# Patient Record
Sex: Male | Born: 1970 | Race: Black or African American | Hispanic: No | Marital: Married | Smoking: Never smoker
Health system: Southern US, Community
[De-identification: ages and names within clinical notes are randomized; demographics above are authoritative.]

## PROBLEM LIST (undated history)

## (undated) DIAGNOSIS — L80 Vitiligo: Secondary | ICD-10-CM

## (undated) DIAGNOSIS — I10 Essential (primary) hypertension: Secondary | ICD-10-CM

## (undated) DIAGNOSIS — K922 Gastrointestinal hemorrhage, unspecified: Secondary | ICD-10-CM

## (undated) DIAGNOSIS — T7840XA Allergy, unspecified, initial encounter: Secondary | ICD-10-CM

## (undated) DIAGNOSIS — M109 Gout, unspecified: Secondary | ICD-10-CM

## (undated) DIAGNOSIS — I219 Acute myocardial infarction, unspecified: Secondary | ICD-10-CM

## (undated) DIAGNOSIS — K5790 Diverticulosis of intestine, part unspecified, without perforation or abscess without bleeding: Secondary | ICD-10-CM

## (undated) HISTORY — DX: Allergy, unspecified, initial encounter: T78.40XA

## (undated) HISTORY — DX: Acute myocardial infarction, unspecified: I21.9

## (undated) HISTORY — DX: Gastrointestinal hemorrhage, unspecified: K92.2

## (undated) HISTORY — PX: HERNIA REPAIR: SHX51

---

## 2016-04-20 ENCOUNTER — Encounter (HOSPITAL_COMMUNITY): Payer: Self-pay

## 2016-04-20 DIAGNOSIS — K5730 Diverticulosis of large intestine without perforation or abscess without bleeding: Secondary | ICD-10-CM | POA: Diagnosis not present

## 2016-04-20 DIAGNOSIS — K640 First degree hemorrhoids: Secondary | ICD-10-CM | POA: Diagnosis not present

## 2016-04-20 DIAGNOSIS — N179 Acute kidney failure, unspecified: Secondary | ICD-10-CM | POA: Insufficient documentation

## 2016-04-20 DIAGNOSIS — L80 Vitiligo: Secondary | ICD-10-CM | POA: Diagnosis not present

## 2016-04-20 DIAGNOSIS — K921 Melena: Principal | ICD-10-CM | POA: Insufficient documentation

## 2016-04-20 DIAGNOSIS — D62 Acute posthemorrhagic anemia: Secondary | ICD-10-CM | POA: Diagnosis not present

## 2016-04-20 DIAGNOSIS — Z9114 Patient's other noncompliance with medication regimen: Secondary | ICD-10-CM | POA: Diagnosis not present

## 2016-04-20 DIAGNOSIS — I10 Essential (primary) hypertension: Secondary | ICD-10-CM | POA: Insufficient documentation

## 2016-04-20 LAB — CBC
HEMATOCRIT: 39.5 % (ref 39.0–52.0)
HEMOGLOBIN: 12.8 g/dL — AB (ref 13.0–17.0)
MCH: 28.4 pg (ref 26.0–34.0)
MCHC: 32.4 g/dL (ref 30.0–36.0)
MCV: 87.6 fL (ref 78.0–100.0)
Platelets: 201 10*3/uL (ref 150–400)
RBC: 4.51 MIL/uL (ref 4.22–5.81)
RDW: 12.2 % (ref 11.5–15.5)
WBC: 5.8 10*3/uL (ref 4.0–10.5)

## 2016-04-20 LAB — COMPREHENSIVE METABOLIC PANEL
ALT: 26 U/L (ref 17–63)
ANION GAP: 8 (ref 5–15)
AST: 38 U/L (ref 15–41)
Albumin: 3.7 g/dL (ref 3.5–5.0)
Alkaline Phosphatase: 70 U/L (ref 38–126)
BILIRUBIN TOTAL: 0.8 mg/dL (ref 0.3–1.2)
BUN: 18 mg/dL (ref 6–20)
CHLORIDE: 105 mmol/L (ref 101–111)
CO2: 24 mmol/L (ref 22–32)
Calcium: 8.8 mg/dL — ABNORMAL LOW (ref 8.9–10.3)
Creatinine, Ser: 2.03 mg/dL — ABNORMAL HIGH (ref 0.61–1.24)
GFR, EST AFRICAN AMERICAN: 43 mL/min — AB (ref >60)
GFR, EST NON AFRICAN AMERICAN: 37 mL/min — AB (ref >60)
Glucose, Bld: 118 mg/dL — ABNORMAL HIGH (ref 65–99)
POTASSIUM: 3.8 mmol/L (ref 3.5–5.1)
Sodium: 137 mmol/L (ref 135–145)
TOTAL PROTEIN: 6.9 g/dL (ref 6.5–8.1)

## 2016-04-20 NOTE — ED Triage Notes (Addendum)
Pt endorses 6 episodes of bloody diarrhea this evening. Denies n/v or abd pain. Pt hypertensive in triage and states that he hasn't taken his BP meds in 2 days. Pt was seen at [facility] and sent here for further evaluation.

## 2016-04-21 ENCOUNTER — Encounter (HOSPITAL_COMMUNITY): Payer: Self-pay | Admitting: Internal Medicine

## 2016-04-21 ENCOUNTER — Observation Stay (HOSPITAL_COMMUNITY)
Admission: EM | Admit: 2016-04-21 | Discharge: 2016-04-22 | Disposition: A | Discharge status: Home / Self Care | Payer: Managed Care, Other (non HMO) | Attending: Internal Medicine | Admitting: Internal Medicine

## 2016-04-21 ENCOUNTER — Inpatient Hospital Stay (HOSPITAL_COMMUNITY): Payer: Managed Care, Other (non HMO)

## 2016-04-21 DIAGNOSIS — D62 Acute posthemorrhagic anemia: Secondary | ICD-10-CM | POA: Diagnosis not present

## 2016-04-21 DIAGNOSIS — K5731 Diverticulosis of large intestine without perforation or abscess with bleeding: Secondary | ICD-10-CM | POA: Diagnosis not present

## 2016-04-21 DIAGNOSIS — K921 Melena: Secondary | ICD-10-CM | POA: Diagnosis not present

## 2016-04-21 DIAGNOSIS — K922 Gastrointestinal hemorrhage, unspecified: Secondary | ICD-10-CM

## 2016-04-21 DIAGNOSIS — Z9114 Patient's other noncompliance with medication regimen: Secondary | ICD-10-CM

## 2016-04-21 DIAGNOSIS — L80 Vitiligo: Secondary | ICD-10-CM | POA: Diagnosis not present

## 2016-04-21 DIAGNOSIS — I10 Essential (primary) hypertension: Secondary | ICD-10-CM

## 2016-04-21 DIAGNOSIS — K625 Hemorrhage of anus and rectum: Secondary | ICD-10-CM | POA: Diagnosis present

## 2016-04-21 HISTORY — DX: Vitiligo: L80

## 2016-04-21 HISTORY — DX: Melena: K92.1

## 2016-04-21 HISTORY — DX: Essential (primary) hypertension: I10

## 2016-04-21 LAB — GASTROINTESTINAL PANEL BY PCR, STOOL (REPLACES STOOL CULTURE)
ASTROVIRUS: NOT DETECTED
Adenovirus F40/41: NOT DETECTED
Campylobacter species: NOT DETECTED
Cryptosporidium: NOT DETECTED
Cyclospora cayetanensis: NOT DETECTED
ENTEROTOXIGENIC E COLI (ETEC): NOT DETECTED
Entamoeba histolytica: NOT DETECTED
Enteroaggregative E coli (EAEC): NOT DETECTED
Enteropathogenic E coli (EPEC): NOT DETECTED
Giardia lamblia: NOT DETECTED
NOROVIRUS GI/GII: NOT DETECTED
Plesimonas shigelloides: NOT DETECTED
ROTAVIRUS A: NOT DETECTED
SALMONELLA SPECIES: NOT DETECTED
SAPOVIRUS (I, II, IV, AND V): NOT DETECTED
SHIGA LIKE TOXIN PRODUCING E COLI (STEC): NOT DETECTED
SHIGELLA/ENTEROINVASIVE E COLI (EIEC): NOT DETECTED
VIBRIO SPECIES: NOT DETECTED
Vibrio cholerae: NOT DETECTED
Yersinia enterocolitica: NOT DETECTED

## 2016-04-21 LAB — PROTIME-INR
INR: 1.05
PROTHROMBIN TIME: 13.7 s (ref 11.4–15.2)

## 2016-04-21 LAB — CBC
HCT: 37.4 % — ABNORMAL LOW (ref 39.0–52.0)
HCT: 37.2 % — ABNORMAL LOW (ref 39.0–52.0)
HEMATOCRIT: 39.4 % (ref 39.0–52.0)
HEMOGLOBIN: 11.9 g/dL — AB (ref 13.0–17.0)
Hemoglobin: 12.2 g/dL — ABNORMAL LOW (ref 13.0–17.0)
Hemoglobin: 12.5 g/dL — ABNORMAL LOW (ref 13.0–17.0)
MCH: 28.4 pg (ref 26.0–34.0)
MCH: 28 pg (ref 26.0–34.0)
MCH: 27.9 pg (ref 26.0–34.0)
MCHC: 32.6 g/dL (ref 30.0–36.0)
MCHC: 32 g/dL (ref 30.0–36.0)
MCHC: 31.7 g/dL (ref 30.0–36.0)
MCV: 87 fL (ref 78.0–100.0)
MCV: 87.9 fL (ref 78.0–100.0)
MCV: 87.5 fL (ref 78.0–100.0)
PLATELETS: 204 10*3/uL (ref 150–400)
Platelets: 191 10*3/uL (ref 150–400)
Platelets: 204 10*3/uL (ref 150–400)
RBC: 4.25 MIL/uL (ref 4.22–5.81)
RBC: 4.3 MIL/uL (ref 4.22–5.81)
RBC: 4.48 MIL/uL (ref 4.22–5.81)
RDW: 12.2 % (ref 11.5–15.5)
RDW: 12.3 % (ref 11.5–15.5)
RDW: 12.3 % (ref 11.5–15.5)
WBC: 4.7 10*3/uL (ref 4.0–10.5)
WBC: 4.9 10*3/uL (ref 4.0–10.5)
WBC: 5.5 10*3/uL (ref 4.0–10.5)

## 2016-04-21 LAB — BASIC METABOLIC PANEL
Anion gap: 7 (ref 5–15)
BUN: 20 mg/dL (ref 6–20)
CHLORIDE: 107 mmol/L (ref 101–111)
CO2: 24 mmol/L (ref 22–32)
CREATININE: 1.91 mg/dL — AB (ref 0.61–1.24)
Calcium: 8.6 mg/dL — ABNORMAL LOW (ref 8.9–10.3)
GFR calc Af Amer: 47 mL/min — ABNORMAL LOW (ref >60)
GFR calc non Af Amer: 40 mL/min — ABNORMAL LOW (ref >60)
GLUCOSE: 95 mg/dL (ref 65–99)
Potassium: 4.1 mmol/L (ref 3.5–5.1)
Sodium: 138 mmol/L (ref 135–145)

## 2016-04-21 LAB — HIV ANTIBODY (ROUTINE TESTING W REFLEX): HIV SCREEN 4TH GENERATION: NONREACTIVE

## 2016-04-21 MED ORDER — AMLODIPINE BESYLATE 10 MG PO TABS
10.0000 mg | ORAL_TABLET | Freq: Every day | ORAL | Status: DC
  Administered 2016-04-21 – 2016-04-22 (×2): 10 mg via ORAL
  Filled 2016-04-21 (×2): qty 1

## 2016-04-21 MED ORDER — ONDANSETRON HCL 4 MG PO TABS: 4.0000 mg | ORAL_TABLET | Freq: Four times a day (QID) | ORAL | Status: DC | PRN

## 2016-04-21 MED ORDER — ONDANSETRON HCL 4 MG/2ML IJ SOLN: 4.0000 mg | Freq: Four times a day (QID) | INTRAMUSCULAR | Status: DC | PRN

## 2016-04-21 MED ORDER — PEG-KCL-NACL-NASULF-NA ASC-C 100 G PO SOLR
1.0000 | Freq: Once | ORAL | Status: AC
  Administered 2016-04-21: 200 g via ORAL
  Filled 2016-04-21: qty 1

## 2016-04-21 MED ORDER — SODIUM CHLORIDE 0.9% FLUSH
3.0000 mL | Freq: Two times a day (BID) | INTRAVENOUS | Status: DC
  Administered 2016-04-21: 3 mL via INTRAVENOUS

## 2016-04-21 MED ORDER — IOHEXOL 300 MG/ML  SOLN
15.0000 mL | INTRAMUSCULAR | Status: AC
  Administered 2016-04-21 (×2): 15 mL via ORAL

## 2016-04-21 MED ORDER — IOPAMIDOL (ISOVUE-300) INJECTION 61%
INTRAVENOUS | Status: AC
  Administered 2016-04-21: 75 mL
  Filled 2016-04-21: qty 75

## 2016-04-21 MED ORDER — SODIUM CHLORIDE 0.9 % IV SOLN: INTRAVENOUS | Status: AC

## 2016-04-21 MED ORDER — AMLODIPINE BESYLATE 10 MG PO TABS: 10.0000 mg | ORAL_TABLET | Freq: Every day | ORAL | Status: DC

## 2016-04-21 MED ORDER — HYDROCODONE-ACETAMINOPHEN 5-325 MG PO TABS: 1.0000 | ORAL_TABLET | ORAL | Status: DC | PRN

## 2016-04-21 MED ORDER — HYDRALAZINE HCL 20 MG/ML IJ SOLN: 10.0000 mg | INTRAMUSCULAR | Status: DC | PRN

## 2016-04-21 MED ORDER — SODIUM CHLORIDE 0.9 % IV SOLN
INTRAVENOUS | Status: DC
  Administered 2016-04-21: 05:00:00 via INTRAVENOUS

## 2016-04-21 MED ORDER — LISINOPRIL 10 MG PO TABS
10.0000 mg | ORAL_TABLET | Freq: Once | ORAL | Status: AC
  Administered 2016-04-21: 10 mg via ORAL
  Filled 2016-04-21: qty 1

## 2016-04-21 NOTE — H&P (Addendum)
History and Physical    Chase Walsh RUE:[id number] DOB: June 14, 1970 DOA: 04/21/2016  PCP: Gwynneth Aliment, MD  Patient coming from: Home  Chief Complaint: rectal bleeding  HPI: Chase Walsh is a 46 y.o. male with medical history significant of HTN and vitiligo who presents for BRBPR.  He reports that he was in his normal state of health today at work when he felt the need to have a BM.  He went to the bathroom and felt like the BM was watery, which surprised him.  He wiped and noticed blood, and then saw the the toilet bowel was filled with blood.  He had about 2-3 more episodes like that and then came in to [facility] where he was advised to go to the ED.  He had three episodes in the ED, one very large.  He has not had any new foods (ate tuna sandwhich from [place] for lunch today), no travel, no sick contact.  He has had no fever, chills, abdominal pain, bleeding from any other sites, rash.  He has been taking ibuprofen and amoxicillin for a recent dental infection.  He denies any lightheadedness or dizziness.  He has no family history of IBD.  He thinks his dad's brother may have colon cancer for which he is receiving treatment currently, but he is not sure.  He is a non smoker.  He has no upper GI symptoms.    ED Course: In the ED, he had a witnessed episode of hematochezia.  BP was elevated but he did not take his BP medications today. Initial Hgb was 12.8 and trended to 12.2 on recheck.  Cr was 2.03 - he has no reported history of CKD, however, BUN was normal.    Review of Systems: As per HPI otherwise 10 point review of systems negative.   Past Medical History:  Diagnosis Date  . Hypertension   . Vitiligo     Past Surgical History:  Procedure Laterality Date  . HERNIA REPAIR     Reviewed with patient.   reports that he has never smoked. He has never used smokeless tobacco. He reports that he does not drink alcohol or use drugs.  No Known Allergies  Reviewed with patient.  Family  History  Problem Relation Age of Onset  . Breast cancer Mother   . Arthritis Mother   . Cancer Paternal Uncle     maybe colon  . Inflammatory bowel disease Neg Hx     Also on lisinopril, has not taken in 2 days.  Prior to Admission medications   Medication Sig Start Date End Date Taking? Authorizing Provider  amoxicillin (AMOXIL) 500 MG capsule Take 500 mg by mouth daily. 04/05/16  Yes Historical Provider, MD  ibuprofen (ADVIL,MOTRIN) 800 MG tablet Take 800 mg by mouth 3 (three) times daily as needed for mild pain.  04/05/16  Yes Historical Provider, MD    Physical Exam: Vitals:   04/21/16 0100 04/21/16 0130 04/21/16 0359 04/21/16 0435  BP: (!) 154/112 (!) 151/115 (!) 147/110 (!) 156/107  Pulse:  65 64 73  Resp:    18  Temp:    97.8 F (36.6 C)  TempSrc:    Oral  SpO2:  99% 98% 100%  Weight:    205 lb 9.6 oz (93.3 kg)  Height:     (1.753 m)    Constitutional: NAD, calm, comfortable, sitting in bed Vitals:   04/21/16 0100 04/21/16 0130 04/21/16 0359 04/21/16 0435  BP: (!) 154/112 (!) 151/115 Marland Kitchen)  147/110 (!) 156/107  Pulse:  65 64 73  Resp:    18  Temp:    97.8 F (36.6 C)  TempSrc:    Oral  SpO2:  99% 98% 100%  Weight:    205 lb 9.6 oz (93.3 kg)  Height:     (1.753 m)   Eyes:  lids and conjunctivae normal, no pallor, no icterus ENMT: Mucous membranes are moist.  Neck: normal, supple Respiratory: clear to auscultation bilaterally, no wheezing, no crackles. Normal respiratory effort.  Cardiovascular: Normal rate and Regular rhythm, no murmurs / rubs / gallops. No extremity edema.  Abdomen: +BS, NT, ND Musculoskeletal: no clubbing / cyanosis. Normal muscle tone.  Skin: no rashes, lesions, ulcers.  Lighter patches of skin on chest and behind ears.  Neurologic: Grossly intact, no focal deficit Psychiatric: Normal judgment and insight. Alert and oriented x 3. Normal mood.    Labs on Admission: I have personally reviewed following labs and imaging  studies  CBC:  Recent Labs Lab 04/20/16 2052 04/21/16 0028  WBC 5.8 5.5  HGB 12.8* 12.2*  HCT 39.5 37.4*  MCV 87.6 87.0  PLT 201 204   Basic Metabolic Panel:  Recent Labs Lab 04/20/16 2052  NA 137  K 3.8  CL 105  CO2 24  GLUCOSE 118*  BUN 18  CREATININE 2.03*  CALCIUM 8.8*   GFR: Estimated Creatinine Clearance: 50.7 mL/min (A) (by C-G formula based on SCr of 2.03 mg/dL (H)). Liver Function Tests:  Recent Labs Lab 04/20/16 2052  AST 38  ALT 26  ALKPHOS 70  BILITOT 0.8  PROT 6.9  ALBUMIN 3.7   No results for input(s): LIPASE, AMYLASE in the last 168 hours. No results for input(s): AMMONIA in the last 168 hours. Coagulation Profile:  Recent Labs Lab 04/21/16 0028  INR 1.05   Cardiac Enzymes: No results for input(s): CKTOTAL, CKMB, CKMBINDEX, TROPONINI in the last 168 hours. BNP (last 3 results) No results for input(s): PROBNP in the last 8760 hours. HbA1C: No results for input(s): HGBA1C in the last 72 hours. CBG: No results for input(s): GLUCAP in the last 168 hours. Lipid Profile: No results for input(s): CHOL, HDL, LDLCALC, TRIG, CHOLHDL, LDLDIRECT in the last 72 hours. Thyroid Function Tests: No results for input(s): TSH, T4TOTAL, FREET4, T3FREE, THYROIDAB in the last 72 hours. Anemia Panel: No results for input(s): VITAMINB12, FOLATE, FERRITIN, TIBC, IRON, RETICCTPCT in the last 72 hours. Urine analysis: No results found for: COLORURINE, APPEARANCEUR, LABSPEC, PHURINE, GLUCOSEU, HGBUR, BILIRUBINUR, KETONESUR, PROTEINUR, UROBILINOGEN, NITRITE, LEUKOCYTESUR  Radiological Exams on Admission: No results found.   Assessment/Plan  Hematochezia - DDx includes infectious etiology vs. Inflammatory process vs. Malignancy.  He has no upper GI symptoms so PPI was not started - Interestingly, he has vitiligo which can be associated with IBD. He has no FH of IBD and possibly a family history of colon cancer - LFTs added on, no AST/ALT/bilirubin  abnormalities noted.  - Initial Hgb 12.8 - Trend Hgb - GI pathogen panel sent - NPO - IVF with NS at 75cc/hr - Monitor on telemetry - Enteric precautions - FOBT not obtained as frank blood noted by EDP on exam.  - As he is currently hemodynamically stable, GI should be consulted in the AM for possible colonoscopy - He has been on Abx and ibuprofen for acute dental issues, these were held.  If not improving, consider Cdiff - though not normally associated with bloody diarrhea.     Vitiligo - Noted on  exam, no acute issue   Hypertension - BP has been elevated overnight, no medications for 2 days.  Lisinopril in ED provided some improvement in systolic pressure, but diastolic has remained somewhat elevated - Given renal function, change BP medication to amlodipine  CKD vs. AKI (unknown baseline) - IVF with NS - Trend Cr.    DVT prophylaxis: SCDs given ongoing bleeding Code Status: Full Disposition Plan: d/c in 1-2 days depending on course of illness Consults called: Needs GI in the AM Admission status: Telemetry, inpatient   Debe Coder MD [facility] Pager [phone]  If 7PM-7AM, please contact night-coverage [email or web address] Password [facility]  04/21/2016, 5:47 AM

## 2016-04-21 NOTE — ED Provider Notes (Signed)
[facility]-EMERGENCY DEPT Provider Note   CSN: [id number] Arrival date & time: 04/20/16  2026     History   Chief Complaint Chief Complaint  Patient presents with  . Blood In Stools    HPI Chase Walsh is a 46 y.o. male.  46 year old male with history of hypertension presents to the emergency department for evaluation of hematochezia. He states that symptoms began this evening. He has had approximately 6 episodes with passage of large amounts of blood. He denies any passage of clots. He has had no rectal pain or abdominal pain. No constipated BMs prior to onset of hematochezia. No nausea or vomiting or fevers. Patient was seen at urgent care with a normal rectal exam. They sent him to the emergency department for further evaluation. Patient has had 2 additional episodes of hematochezia since arrival to the ED. Patient reports eating a burger from cookout yesterday. He denies any other potential consumption of undercooked meats. He denies any recent travel. He has never had a colonoscopy. No known family history of colon cancer, per patient. He was noted to be hypertensive in triage, but has been noncompliant with his blood pressure medication of the last 2 days.     Past Medical History:  Diagnosis Date  . Hypertension     There are no active problems to display for this patient.   Past Surgical History:  Procedure Laterality Date  . HERNIA REPAIR         Home Medications    Prior to Admission medications   Medication Sig Start Date End Date Taking? Authorizing Provider  amoxicillin (AMOXIL) 500 MG capsule Take 500 mg by mouth daily. 04/05/16  Yes Historical Provider, MD  ibuprofen (ADVIL,MOTRIN) 800 MG tablet Take 800 mg by mouth 3 (three) times daily as needed for mild pain.  04/05/16  Yes Historical Provider, MD    Family History History reviewed. No pertinent family history.  Social History Social History  Substance Use Topics  . Smoking status: Never Smoker  .  Smokeless tobacco: Never Used  . Alcohol use No     Allergies   Patient has no known allergies.   Review of Systems Review of Systems Ten systems reviewed and are negative for acute change, except as noted in the HPI.    Physical Exam Updated Vital Signs BP (!) 151/115   Pulse 65   Temp 98.4 F (36.9 C) (Oral)   Resp 18   Ht  (1.753 m)   Wt 93.4 kg   SpO2 99%   BMI 30.42 kg/m   Physical Exam  Constitutional: He is oriented to person, place, and time. He appears well-developed and well-nourished. No distress.  Nontoxic and in no acute distress  HENT:  Head: Normocephalic and atraumatic.  Eyes: Conjunctivae and EOM are normal. No scleral icterus.  Neck: Normal range of motion.  Cardiovascular: Normal rate, regular rhythm and intact distal pulses.   Pulmonary/Chest: Effort normal. No respiratory distress. He has no wheezes.  Respirations even and unlabored  Abdominal: Soft. He exhibits no distension and no mass. There is no tenderness. There is no guarding.  Soft, nontender abdomen. No masses, guarding, or rigidity.  Genitourinary:  Genitourinary Comments: Deferred as patient received this at [facility] prior to arrival; reports no evidence of hemorrhoids or fissure.  Musculoskeletal: Normal range of motion.  Neurological: He is alert and oriented to person, place, and time. He exhibits normal muscle tone. Coordination normal.  Skin: Skin is warm and dry. No rash noted.  He is not diaphoretic. No erythema. No pallor.  Psychiatric: He has a normal mood and affect. His behavior is normal.  Nursing note and vitals reviewed.    ED Treatments / Results  Labs (all labs ordered are listed, but only abnormal results are displayed) Labs Reviewed  COMPREHENSIVE METABOLIC PANEL - Abnormal; Notable for the following:       Result Value   Glucose, Bld 118 (*)    Creatinine, Ser 2.03 (*)    Calcium 8.8 (*)    GFR calc non Af Amer 37 (*)    GFR calc Af Amer 43 (*)    All other  components within normal limits  CBC - Abnormal; Notable for the following:    Hemoglobin 12.8 (*)    All other components within normal limits  CBC - Abnormal; Notable for the following:    Hemoglobin 12.2 (*)    HCT 37.4 (*)    All other components within normal limits  GASTROINTESTINAL PANEL BY PCR, STOOL (REPLACES STOOL CULTURE)  PROTIME-INR    EKG  EKG Interpretation None       Radiology No results found.  Procedures Procedures (including critical care time)  Medications Ordered in ED Medications  lisinopril (PRINIVIL,ZESTRIL) tablet 10 mg (10 mg Oral Given 04/21/16 0100)     Initial Impression / Assessment and Plan / ED Course  I have reviewed the triage vital signs and the nursing notes.  Pertinent labs & imaging results that were available during my care of the patient were reviewed by me and considered in my medical decision making (see chart for details).     46 year old male presents to the emergency department for evaluation of gross hematochezia. Patient with images of large amounts of bloody stool. He provided a sample today which was mostly gross blood with clots. He has no complaints of abdominal pain. No nausea or vomiting. No fevers. Abdomen soft and nontender. Given degree of bleeding and persistence of symptoms, plan for admission for serial CBCs and inpatient colonoscopy. Patient currently hemodynamically stable. Case discussed with Dr. Criselda Peaches who will admit.   Final Clinical Impressions(s) / ED Diagnoses   Final diagnoses:  Lower GI bleed  Hypertension not at goal  Noncompliance with medication regimen    New Prescriptions New Prescriptions   No medications on file     Antony Madura, PA-C 04/21/16 0228    Jacalyn Lefevre, MD 04/21/16 [phone]

## 2016-04-21 NOTE — ED Notes (Signed)
Paged admitting MD Criselda Peaches to notify of blood pressure. Advises pt is appropriate for the floor at this time.

## 2016-04-21 NOTE — Consult Note (Signed)
 Referring Provider: [facility]  Primary Care Physician:  Robyn N Sanders, MD Primary Gastroenterologist:  unassigned  Reason for Consultation:  Rectal bleeding  ASSESSMENT AND PLAN:   1. 46 yo male with painless hematochezia and a one gram drop in hgb overnight from 12.8 to 11.9. He has been taking 800 mg Ibuprofen over the last week. Bleeding could be perianal in nature. Diverticular hemorrhage possible. A brisk upper Gi bleed seems unlikely -monitor CBC, transfuse if needed.  -Can have clears.  -Possible inpatient colonoscopy depending on clinical course. If not then close outpatient follow up and colonscopy  2. AKI. No prior labs to know if component of CKD. Cr improved overnight 2.03 >>> 1.91  3. HTN. Uncontrolled at prsent.   HPI: Chase Walsh is a 46 y.o. male, relatively healthy, admitted with painless hematochezia. No prior labs to compare but hgb on admission yesterday was 12.8, down to 11.9 overnight. His has AKI, not sure if CKD given no prior labs. His BUN is normal.   Yesterday after work patient began having hematochezia. First episode contained solid stool with blood then loose stool / blood going forward. This happened about 4 times and patient came to ED. He had an additional 3-4 episodes of hematochezia after arrival to the hospital. He never had any abdominal or rectal pain associated with the bleeding. This has never happened to him before. No nausea. He has been taking 800 mg ibuprofen for a week for tooth pain. Prior to onset of bleeding patient felt fine. He reports normal BMs, no straining. No chronic GI or general medical complaints. His last episode of hematochezia was around 4am.    Past Medical History:  Diagnosis Date  . Hypertension   . Vitiligo     Past Surgical History:  Procedure Laterality Date  . HERNIA REPAIR      Prior to Admission medications   Medication Sig Start Date End Date Taking? Authorizing Provider  amoxicillin (AMOXIL)  500 MG capsule Take 500 mg by mouth daily. 04/05/16  Yes Historical Provider, MD  ibuprofen (ADVIL,MOTRIN) 800 MG tablet Take 800 mg by mouth 3 (three) times daily as needed for mild pain.  04/05/16  Yes Historical Provider, MD    Current Facility-Administered Medications  Medication Dose Route Frequency Provider Last Rate Last Dose  . 0.9 %  sodium chloride infusion   Intravenous Continuous Nayana Abrol, MD      . amLODipine (NORVASC) tablet 10 mg  10 mg Oral Daily Emily B Mullen, MD      . hydrALAZINE (APRESOLINE) injection 10 mg  10 mg Intravenous Q4H PRN Nayana Abrol, MD      . HYDROcodone-acetaminophen (NORCO/VICODIN) 5-325 MG per tablet 1-2 tablet  1-2 tablet Oral Q4H PRN Emily B Mullen, MD      . ondansetron (ZOFRAN) tablet 4 mg  4 mg Oral Q6H PRN Emily B Mullen, MD       Or  . ondansetron (ZOFRAN) injection 4 mg  4 mg Intravenous Q6H PRN Emily B Mullen, MD      . sodium chloride flush (NS) 0.9 % injection 3 mL  3 mL Intravenous Q12H Emily B Mullen, MD        Allergies as of 04/20/2016  . (No Known Allergies)    Family History  Problem Relation Age of Onset  . Breast cancer Mother   . Arthritis Mother   . Cancer Paternal Uncle     maybe colon  . Inflammatory bowel disease Neg Hx       Social History   Social History  . Marital status: Unknown    Spouse name: N/A  . Number of children: N/A  . Years of education: N/A   Occupational History  . Not on file.   Social History Main Topics  . Smoking status: Never Smoker  . Smokeless tobacco: Never Used  . Alcohol use No  . Drug use: No  . Sexual activity: Yes    Partners: Female     Comment: married, monogamous   Other Topics Concern  . Not on file   Social History Narrative  . No narrative on file    Review of Systems: All systems reviewed and negative except where noted in HPI.  Physical Exam: Vital signs in last 24 hours: Temp:  [97.8 F (36.6 C)-98.4 F (36.9 C)] 97.8 F (36.6 C) (04/20 0435) Pulse Rate:   [64-74] 73 (04/20 0435) Resp:  [16-18] 18 (04/20 0435) BP: (145-182)/(101-125) 156/107 (04/20 0435) SpO2:  [98 %-100 %] 100 % (04/20 0435) Weight:  [205 lb 9.6 oz (93.3 kg)-206 lb (93.4 kg)] 205 lb 9.6 oz (93.3 kg) (04/20 0435) Last BM Date: 04/21/16 General:   Alert,  well-developed, black male in NAD Eyes:  Sclera clear, no icterus.   Conjunctiva pink. Ears:  Normal auditory acuity. Nose:  No deformity, discharge,  or lesions. Neck:  Supple; no masses Lungs:  Clear throughout to auscultation.   No wheezes, crackles, or rhonchi.  Heart:  Regular rate and rhythm; no murmurs, no edema Abdomen:  Soft,nontender, BS active,nonpalp mass or hsm.   Rectal:  No external lesions. Scant amount of fresh blood at anal opening. Scant fresh blood on DRE. No tenderness on DRE.  Msk:  Symmetrical without gross deformities. . Extremities:  Without clubbing or edema. Neurologic:  Alert and  oriented x4;  grossly normal neurologically. Skin:  Intact without significant lesions or rashes.. Psych:  Alert and cooperative. Normal mood and affect.  Intake/Output from previous day: No intake/output data recorded. Intake/Output this shift: No intake/output data recorded.  Lab Results:  Recent Labs  04/20/16 2052 04/21/16 0028 04/21/16 0627  WBC 5.8 5.5 4.7  HGB 12.8* 12.2* 11.9*  HCT 39.5 37.4* 37.2*  PLT 201 204 191   BMET  Recent Labs  04/20/16 2052 04/21/16 0627  NA 137 138  K 3.8 4.1  CL 105 107  CO2 24 24  GLUCOSE 118* 95  BUN 18 20  CREATININE 2.03* 1.91*  CALCIUM 8.8* 8.6*   LFT  Recent Labs  04/20/16 2052  PROT 6.9  ALBUMIN 3.7  AST 38  ALT 26  ALKPHOS 70  BILITOT 0.8   PT/INR  Recent Labs  04/21/16 0028  LABPROT 13.7  INR 1.05    Studies/Results: No results found.  Willette Cluster, NP-C @  04/21/2016, 9:42 AM  Pager number [phone]

## 2016-04-21 NOTE — Progress Notes (Addendum)
Patient seen and examined  46 year old male with a history of hypertension presented with bright red blood per rectum.In the ED, he had a witnessed episode of hematochezia.  BP was elevated but he did not take his BP medications today. Initial Hgb was 12.8 and trended to 12.2 on recheck.  Cr was 2.03 - he has no reported history of CKD, however, BUN was normal.   Hemoglobin-no significant drop, 12.8>11.9  Plan CT abdomen pelvis to rule out colitis,GI panel pending GI consultation for possible colonoscopy given family history of colon cancer

## 2016-04-22 ENCOUNTER — Encounter (HOSPITAL_COMMUNITY): Payer: Self-pay

## 2016-04-22 ENCOUNTER — Encounter (HOSPITAL_COMMUNITY)
Admission: EM | Disposition: A | Discharge status: Home / Self Care | Payer: Self-pay | Source: Home / Self Care | Attending: Emergency Medicine

## 2016-04-22 DIAGNOSIS — I10 Essential (primary) hypertension: Secondary | ICD-10-CM | POA: Diagnosis not present

## 2016-04-22 DIAGNOSIS — K5731 Diverticulosis of large intestine without perforation or abscess with bleeding: Secondary | ICD-10-CM

## 2016-04-22 DIAGNOSIS — K625 Hemorrhage of anus and rectum: Secondary | ICD-10-CM | POA: Diagnosis present

## 2016-04-22 DIAGNOSIS — K921 Melena: Secondary | ICD-10-CM | POA: Diagnosis not present

## 2016-04-22 DIAGNOSIS — D62 Acute posthemorrhagic anemia: Secondary | ICD-10-CM | POA: Diagnosis not present

## 2016-04-22 HISTORY — PX: COLONOSCOPY: SHX5424

## 2016-04-22 LAB — COMPREHENSIVE METABOLIC PANEL
ALT: 25 U/L (ref 17–63)
AST: 26 U/L (ref 15–41)
Albumin: 3.6 g/dL (ref 3.5–5.0)
Alkaline Phosphatase: 67 U/L (ref 38–126)
Anion gap: 8 (ref 5–15)
BUN: 15 mg/dL (ref 6–20)
CO2: 22 mmol/L (ref 22–32)
Calcium: 9 mg/dL (ref 8.9–10.3)
Chloride: 110 mmol/L (ref 101–111)
Creatinine, Ser: 1.79 mg/dL — ABNORMAL HIGH (ref 0.61–1.24)
GFR calc Af Amer: 50 mL/min — ABNORMAL LOW (ref >60)
GFR calc non Af Amer: 43 mL/min — ABNORMAL LOW (ref >60)
Glucose, Bld: 94 mg/dL (ref 65–99)
Potassium: 4.7 mmol/L (ref 3.5–5.1)
Sodium: 140 mmol/L (ref 135–145)
Total Bilirubin: 1 mg/dL (ref 0.3–1.2)
Total Protein: 6.8 g/dL (ref 6.5–8.1)

## 2016-04-22 SURGERY — COLONOSCOPY
Anesthesia: Moderate Sedation

## 2016-04-22 MED ORDER — MIDAZOLAM HCL 5 MG/ML IJ SOLN
INTRAMUSCULAR | Status: AC
  Filled 2016-04-22: qty 2

## 2016-04-22 MED ORDER — FENTANYL CITRATE (PF) 100 MCG/2ML IJ SOLN
INTRAMUSCULAR | Status: AC
  Filled 2016-04-22: qty 2

## 2016-04-22 MED ORDER — FENTANYL CITRATE (PF) 100 MCG/2ML IJ SOLN
INTRAMUSCULAR | Status: DC | PRN
  Administered 2016-04-22: 50 ug via INTRAVENOUS

## 2016-04-22 MED ORDER — AMLODIPINE BESYLATE 5 MG PO TABS: 5.0000 mg | ORAL_TABLET | Freq: Every day | ORAL | 0 refills | Status: DC

## 2016-04-22 MED ORDER — LISINOPRIL 10 MG PO TABS: 10.0000 mg | ORAL_TABLET | Freq: Every day | ORAL | 0 refills | Status: DC

## 2016-04-22 MED ORDER — DIPHENHYDRAMINE HCL 50 MG/ML IJ SOLN
INTRAMUSCULAR | Status: AC
  Filled 2016-04-22: qty 1

## 2016-04-22 MED ORDER — MIDAZOLAM HCL 5 MG/5ML IJ SOLN
INTRAMUSCULAR | Status: DC | PRN
  Administered 2016-04-22: 3 mg via INTRAVENOUS

## 2016-04-22 NOTE — Interval H&P Note (Signed)
History and Physical Interval Note:  04/22/2016 11:04 AM  Chase Walsh  has presented today for surgery, with the diagnosis of hematochezia  The various methods of treatment have been discussed with the patient and family. After consideration of risks, benefits and other options for treatment, the patient has consented to  Procedure(s): COLONOSCOPY (N/A) as a surgical intervention .  The patient's history has been reviewed, patient examined, no change in status, stable for surgery.  I have reviewed the patient's chart and labs.  Questions were answered to the patient's satisfaction.     Charlie Pitter III

## 2016-04-22 NOTE — H&P (View-Only) (Signed)
Referring Provider: [facility]  Primary Care Physician:  Gwynneth Aliment, MD Primary Gastroenterologist:  unassigned  Reason for Consultation:  Rectal bleeding  ASSESSMENT AND PLAN:   1. 46 yo male with painless hematochezia and a one gram drop in hgb overnight from 12.8 to 11.9. He has been taking 800 mg Ibuprofen over the last week. Bleeding could be perianal in nature. Diverticular hemorrhage possible. A brisk upper Gi bleed seems unlikely -monitor CBC, transfuse if needed.  -Can have clears.  -Possible inpatient colonoscopy depending on clinical course. If not then close outpatient follow up and colonscopy  2. AKI. No prior labs to know if component of CKD. Cr improved overnight 2.03 >>> 1.91  3. HTN. Uncontrolled at prsent.   HPI: Chase Walsh is a 46 y.o. male, relatively healthy, admitted with painless hematochezia. No prior labs to compare but hgb on admission yesterday was 12.8, down to 11.9 overnight. His has AKI, not sure if CKD given no prior labs. His BUN is normal.   Yesterday after work patient began having hematochezia. First episode contained solid stool with blood then loose stool / blood going forward. This happened about 4 times and patient came to ED. He had an additional 3-4 episodes of hematochezia after arrival to the hospital. He never had any abdominal or rectal pain associated with the bleeding. This has never happened to him before. No nausea. He has been taking 800 mg ibuprofen for a week for tooth pain. Prior to onset of bleeding patient felt fine. He reports normal BMs, no straining. No chronic GI or general medical complaints. His last episode of hematochezia was around 4am.    Past Medical History:  Diagnosis Date  . Hypertension   . Vitiligo     Past Surgical History:  Procedure Laterality Date  . HERNIA REPAIR      Prior to Admission medications   Medication Sig Start Date End Date Taking? Authorizing Provider  amoxicillin (AMOXIL)  500 MG capsule Take 500 mg by mouth daily. 04/05/16  Yes Historical Provider, MD  ibuprofen (ADVIL,MOTRIN) 800 MG tablet Take 800 mg by mouth 3 (three) times daily as needed for mild pain.  04/05/16  Yes Historical Provider, MD    Current Facility-Administered Medications  Medication Dose Route Frequency Provider Last Rate Last Dose  . 0.9 %  sodium chloride infusion   Intravenous Continuous Richarda Overlie, MD      . amLODipine (NORVASC) tablet 10 mg  10 mg Oral Daily Inez Catalina, MD      . hydrALAZINE (APRESOLINE) injection 10 mg  10 mg Intravenous Q4H PRN Richarda Overlie, MD      . HYDROcodone-acetaminophen (NORCO/VICODIN) 5-325 MG per tablet 1-2 tablet  1-2 tablet Oral Q4H PRN Inez Catalina, MD      . ondansetron [facility]) tablet 4 mg  4 mg Oral Q6H PRN Inez Catalina, MD       Or  . ondansetron [facility]) injection 4 mg  4 mg Intravenous Q6H PRN Inez Catalina, MD      . sodium chloride flush (NS) 0.9 % injection 3 mL  3 mL Intravenous Q12H Inez Catalina, MD        Allergies as of 04/20/2016  . (No Known Allergies)    Family History  Problem Relation Age of Onset  . Breast cancer Mother   . Arthritis Mother   . Cancer Paternal Uncle     maybe colon  . Inflammatory bowel disease Neg Hx  Social History   Social History  . Marital status: Unknown    Spouse name: N/A  . Number of children: N/A  . Years of education: N/A   Occupational History  . Not on file.   Social History Main Topics  . Smoking status: Never Smoker  . Smokeless tobacco: Never Used  . Alcohol use No  . Drug use: No  . Sexual activity: Yes    Partners: Female     Comment: married, monogamous   Other Topics Concern  . Not on file   Social History Narrative  . No narrative on file    Review of Systems: All systems reviewed and negative except where noted in HPI.  Physical Exam: Vital signs in last 24 hours: Temp:  [97.8 F (36.6 C)-98.4 F (36.9 C)] 97.8 F (36.6 C) (04/20 0435) Pulse Rate:   [64-74] 73 (04/20 0435) Resp:  [16-18] 18 (04/20 0435) BP: (145-182)/(101-125) 156/107 (04/20 0435) SpO2:  [98 %-100 %] 100 % (04/20 0435) Weight:  [205 lb 9.6 oz (93.3 kg)-206 lb (93.4 kg)] 205 lb 9.6 oz (93.3 kg) (04/20 0435) Last BM Date: 04/21/16 General:   Alert,  well-developed, black male in NAD Eyes:  Sclera clear, no icterus.   Conjunctiva pink. Ears:  Normal auditory acuity. Nose:  No deformity, discharge,  or lesions. Neck:  Supple; no masses Lungs:  Clear throughout to auscultation.   No wheezes, crackles, or rhonchi.  Heart:  Regular rate and rhythm; no murmurs, no edema Abdomen:  Soft,nontender, BS active,nonpalp mass or hsm.   Rectal:  No external lesions. Scant amount of fresh blood at anal opening. Scant fresh blood on DRE. No tenderness on DRE.  Msk:  Symmetrical without gross deformities. . Extremities:  Without clubbing or edema. Neurologic:  Alert and  oriented x4;  grossly normal neurologically. Skin:  Intact without significant lesions or rashes.. Psych:  Alert and cooperative. Normal mood and affect.  Intake/Output from previous day: No intake/output data recorded. Intake/Output this shift: No intake/output data recorded.  Lab Results:  Recent Labs  04/20/16 2052 04/21/16 0028 04/21/16 0627  WBC 5.8 5.5 4.7  HGB 12.8* 12.2* 11.9*  HCT 39.5 37.4* 37.2*  PLT 201 204 191   BMET  Recent Labs  04/20/16 2052 04/21/16 0627  NA 137 138  K 3.8 4.1  CL 105 107  CO2 24 24  GLUCOSE 118* 95  BUN 18 20  CREATININE 2.03* 1.91*  CALCIUM 8.8* 8.6*   LFT  Recent Labs  04/20/16 2052  PROT 6.9  ALBUMIN 3.7  AST 38  ALT 26  ALKPHOS 70  BILITOT 0.8   PT/INR  Recent Labs  04/21/16 0028  LABPROT 13.7  INR 1.05    Studies/Results: No results found.  Willette Cluster, NP-C @  04/21/2016, 9:42 AM  Pager number [phone]

## 2016-04-22 NOTE — Progress Notes (Signed)
No issues at present. Iv removed. Education completed  Media planner, Charlaine Dalton RN

## 2016-04-22 NOTE — Discharge Summary (Signed)
Chase Walsh, is a 46 y.o. male  DOB 05/15/1970  MRN [id number].  Admission date:  04/21/2016  Admitting Physician  Inez Catalina, MD  Discharge Date:  04/22/2016   Primary MD  Gwynneth Aliment, MD  Recommendations for primary care physician for things to follow:   Rectal bleeding thought to be secondary to diverticulosis Appreciate GI input.  If any further bleeding will need capsule endoscopy Please repeat cbc in 1 week to ensure stabiliy of Hgb  Anemia Hgb 12.5 on 4/21 Repeat cbc in 1 week  Mild renal insufficiency Lisinopril stopped to to ? Mild renal failure creatine 2.03 (on admission ) Creatinine 4/21  1.79 Use amlodipine  po qday Check bmp in 1 week If persistent consider renal ultrasound and spep/upep as outpatient  Hypertension Cont amlodipine  po qday f/u bp in 1 week with pcp   Admission Diagnosis  Lower GI bleed [K92.2] Noncompliance with medication regimen [Z91.14] Hypertension not at goal [I10]   Discharge Diagnosis  Lower GI bleed [K92.2] Noncompliance with medication regimen [Z91.14] Hypertension not at goal [I10]    Active Problems:   Hematochezia   Vitiligo   Hypertension   Lower GI bleed   Acute blood loss anemia   Diverticulosis of colon with hemorrhage      Past Medical History:  Diagnosis Date  . Hypertension   . Vitiligo     Past Surgical History:  Procedure Laterality Date  . HERNIA REPAIR         HPI  from the history and physical done on the day of admission:      46 y.o. male with medical history significant of HTN and vitiligo who presents for BRBPR.  He reports that he was in his normal state of health today at work when he felt the need to have a BM.  He went to the bathroom and felt like the BM was watery, which surprised him.  He wiped and noticed blood, and then saw the the toilet bowel was filled with blood.  He had about  2-3 more episodes like that and then came in to [facility] where he was advised to go to the ED.  He had three episodes in the ED, one very large.  He has not had any new foods (ate tuna sandwhich from [place] for lunch today), no travel, no sick contact.  He has had no fever, chills, abdominal pain, bleeding from any other sites, rash.  He has been taking ibuprofen and amoxicillin for a recent dental infection.  He denies any lightheadedness or dizziness.  He has no family history of IBD.  He thinks his dad's brother may have colon cancer for which he is receiving treatment currently, but he is not sure.  He is a non smoker.  He has no upper GI symptoms.    ED Course: In the ED, he had a witnessed episode of hematochezia.  BP was elevated but he did not take his BP medications today. Initial Hgb was  12.8 and trended to 12.2 on recheck.  Cr was 2.03 - he has no reported history of CKD, however, BUN was normal.      Hospital Course:      Pt was admitted and had 3 brbpr, 2 while in the ED and 1 on the floor.  Hgb 12.8 remained stable.  Pt had mild renal insufficiency on admission.  Lisinopril was stopped and patient hydrated.    Pt had CT scan abd/pelvis which was showed diverticulosis.  Pt was made NPO.  GI consulted.  Pt had colonoscopy 04/22/2016=> diverticulosis, int hemorrhoids. No active bleeding seen.   Pt is doing well s/p colonoscpy.  Denies abd pain, n/v, diarrhea, brbpr.  Pt appears stable and will be discharged to home.     Follow UP  Follow-up Information    Gwynneth Aliment, MD Follow up in 1 week(s).   Specialty:  Internal Medicine Contact information: [address] [place] [address] [phone]            Consults obtained - GI  Discharge Condition: stable  Diet and Activity recommendation: See Discharge Instructions below  Discharge Instructions         Discharge Medications     Allergies as of 04/22/2016   No Known Allergies     Medication List      STOP taking these medications   amoxicillin 500 MG capsule Commonly known as:  AMOXIL   ibuprofen 800 MG tablet Commonly known as:  ADVIL,MOTRIN     TAKE these medications   amLODipine 5 MG tablet Commonly known as:  NORVASC Take 1 tablet (5 mg total) by mouth daily. Start taking on:  04/23/2016       Major procedures and Radiology Reports - PLEASE review detailed and final reports for all details, in brief -      Ct Abdomen Pelvis W Contrast  Result Date: 04/21/2016 CLINICAL DATA:  Blood in stool starting yesterday. EXAM: CT ABDOMEN AND PELVIS WITH CONTRAST TECHNIQUE: Multidetector CT imaging of the abdomen and pelvis was performed using the standard protocol following bolus administration of intravenous contrast. CONTRAST:  1 ISOVUE-300 IOPAMIDOL (ISOVUE-300) INJECTION 61% 75 cc volume COMPARISON:  None. FINDINGS: Lower chest: Unremarkable Hepatobiliary: 2.1 by 1.5 by 1.5 cm hypodense lesion in segment 6 of the liver, image 23/3, with only subtle and indistinct hypodensity in this vicinity on the delayed images, this lesion is technically nonspecific but probably not a simple cyst. No other liver lesions seen. Gallbladder unremarkable. No biliary dilatation. Pancreas: Unremarkable Spleen: Unremarkable Adrenals/Urinary Tract: Several small hypodense lesions of the right kidney are technically too small to characterize although statistically likely to be cysts. Adrenal glands normal. Stomach/Bowel: Several diverticula of the descending and sigmoid colon noted. No active diverticulitis is identified. Appendix normal. Vascular/Lymphatic: Mild aortoiliac atherosclerotic vascular disease. No pathologic adenopathy. Reproductive: Unremarkable Other: No supplemental non-categorized findings. Musculoskeletal: Sclerosis along the right iliac side of the sacroiliac joint IMPRESSION: 1. Scattered descending and sigmoid colon diverticula but without active diverticulitis. A specific cause for the  gastrointestinal bleeding is not identified and further workup is probably warranted. 2. There is a abnormal hypodense lesion in segment 6 of the liver. I am skeptical that this is a simple cyst based on the slightly indistinct margins and somewhat high density. A neoplastic or metastatic lesion is not completely excluded, consider hepatic protocol MRI with and without contrast to definitively characterize this lesion in a noninvasive fashion. 3.  Aortic Atherosclerosis (ICD10-I70.0). 4. Asymmetric  sacroiliitis with sclerosis along the right iliac bone adjacent to the SI joint. Electronically Signed   By: Gaylyn Rong M.D.   On: 04/21/2016 13:27    Micro Results     Recent Results (from the past 240 hour(s))  Gastrointestinal Panel by PCR , Stool     Status: None   Collection Time: 04/21/16  1:53 AM  Result Value Ref Range Status   Campylobacter species NOT DETECTED NOT DETECTED Final   Plesimonas shigelloides NOT DETECTED NOT DETECTED Final   Salmonella species NOT DETECTED NOT DETECTED Final   Yersinia enterocolitica NOT DETECTED NOT DETECTED Final   Vibrio species NOT DETECTED NOT DETECTED Final   Vibrio cholerae NOT DETECTED NOT DETECTED Final   Enteroaggregative E coli (EAEC) NOT DETECTED NOT DETECTED Final   Enteropathogenic E coli (EPEC) NOT DETECTED NOT DETECTED Final   Enterotoxigenic E coli (ETEC) NOT DETECTED NOT DETECTED Final   Shiga like toxin producing E coli (STEC) NOT DETECTED NOT DETECTED Final   Shigella/Enteroinvasive E coli (EIEC) NOT DETECTED NOT DETECTED Final   Cryptosporidium NOT DETECTED NOT DETECTED Final   Cyclospora cayetanensis NOT DETECTED NOT DETECTED Final   Entamoeba histolytica NOT DETECTED NOT DETECTED Final   Giardia lamblia NOT DETECTED NOT DETECTED Final   Adenovirus F40/41 NOT DETECTED NOT DETECTED Final   Astrovirus NOT DETECTED NOT DETECTED Final   Norovirus GI/GII NOT DETECTED NOT DETECTED Final   Rotavirus A NOT DETECTED NOT DETECTED  Final   Sapovirus (I, II, IV, and V) NOT DETECTED NOT DETECTED Final       Today   Subjective    Chase Walsh today has not had any rectal bleeding,  No  headache,no chest abdominal pain,no new weakness tingling or numbness, feels much better wants to go home today.    Objective   Blood pressure (!) 94/53, pulse 65, temperature 97.7 F (36.5 C), temperature source Oral, resp. rate 17, height  (1.753 m), weight 93.3 kg (205 lb 9.6 oz), SpO2 100 %.   Intake/Output Summary (Last 24 hours) at 04/22/16 1720 Last data filed at 04/22/16 0730  Gross per 24 hour  Intake                0 ml  Output                0 ml  Net                0 ml    Exam Awake Alert, Oriented x 3, No new F.N deficits, Normal affect [place].AT,PERRAL Supple Neck,No JVD, No cervical lymphadenopathy appriciated.  Symmetrical Chest wall movement, Good air movement bilaterally, CTAB RRR,No Gallops,Rubs or new Murmurs, No Parasternal Heave +ve B.Sounds, Abd Soft, Non tender, No organomegaly appriciated, No rebound -guarding or rigidity. No Cyanosis, Clubbing or edema, No new Rash or bruise   Data Review   CBC w Diff:  Lab Results  Component Value Date   WBC 4.9 04/21/2016   HGB 12.5 (L) 04/21/2016   HCT 39.4 04/21/2016   PLT 204 04/21/2016    CMP:  Lab Results  Component Value Date   NA 140 04/22/2016   K 4.7 04/22/2016   CL 110 04/22/2016   CO2 22 04/22/2016   BUN 15 04/22/2016   CREATININE 1.79 (H) 04/22/2016   PROT 6.8 04/22/2016   ALBUMIN 3.6 04/22/2016   BILITOT 1.0 04/22/2016   ALKPHOS 67 04/22/2016   AST 26 04/22/2016   ALT 25 04/22/2016  .  Total Time in preparing paper work, data evaluation and todays exam - 35 minutes  Pearson Grippe M.D on 04/22/2016 at 5:20 PM  [facility]   Office  [phone]

## 2016-04-22 NOTE — Op Note (Signed)
[facility] Patient Name: Chase Walsh Procedure Date : 04/22/2016 MRN: [id number] Attending MD: Starr Lake. Myrtie Neither , MD Date of Birth: 25-Dec-1970 CSN: [id number] Age: 46 Admit Type: Inpatient Procedure:                Colonoscopy Indications:              Hematochezia Providers:                Sherilyn Cooter L. Myrtie Neither, MD, Will Bonnet RN, RN, Lorenda Ishihara, Technician Referring MD:              Medicines:                Midazolam 3 mg IV, Fentanyl 50 micrograms IV Complications:            No immediate complications. Estimated Blood Loss:     Estimated blood loss: none. Procedure:                Pre-Anesthesia Assessment:                           - Prior to the procedure, a History and Physical                            was performed, and patient medications and                            allergies were reviewed. The patient's tolerance of                            previous anesthesia was also reviewed. The risks                            and benefits of the procedure and the sedation                            options and risks were discussed with the patient.                            All questions were answered, and informed consent                            was obtained. Prior Anticoagulants: The patient has                            taken no previous anticoagulant or antiplatelet                            agents. ASA Grade Assessment: II - A patient with                            mild systemic disease. After reviewing the risks  and benefits, the patient was deemed in                            satisfactory condition to undergo the procedure.                           After obtaining informed consent, the colonoscope                            was passed under direct vision. Throughout the                            procedure, the patient's blood pressure, pulse, and                            oxygen saturations  were monitored continuously. The                            EC-[id number] ([id number]) scope was introduced through                            the anus and advanced to the the cecum, identified                            by appendiceal orifice and ileocecal valve. The                            colonoscopy was performed without difficulty. The                            patient tolerated the procedure well. The quality                            of the bowel preparation was fair. The ileocecal                            valve, appendiceal orifice, and rectum were                            photographed. The quality of the bowel preparation                            was evaluated using the BBPS [facility] Bowel                            Preparation Scale) with scores of: Right Colon = 1,                            Transverse Colon = 2 and Left Colon = 2. The total                            BBPS score equals 5. The bowel preparation used was  MoviPrep. Scope In: 11:16:47 AM Scope Out: 11:25:21 AM Scope Withdrawal Time: 0 hours 5 minutes 7 seconds  Total Procedure Duration: 0 hours 8 minutes 34 seconds  Findings:      The perianal and digital rectal examinations were normal.      Diverticula were found in the entire colon.      Internal hemorrhoids were found during retroflexion. The hemorrhoids       were small and Grade I (internal hemorrhoids that do not prolapse).      The exam was otherwise without abnormality on direct and retroflexion       views. Impression:               - Preparation of the colon was fair.                           - Diverticulosis in the entire examined colon.                           - Internal hemorrhoids.                           - The examination was otherwise normal on direct                            and retroflexion views.                           - No specimens collected.                           Patient most-likely had a  self-limited diverticular                            bleed with mild drop in hemoglobin. Hemorrhoids are                            small, non-inflamed and without stigmata of                            bleeding. Moderate Sedation:      Moderate (conscious) sedation was administered by the endoscopy nurse       and supervised by the endoscopist. The following parameters were       monitored: oxygen saturation, heart rate, blood pressure, and response       to care. Total physician intraservice time was 11 minutes. Recommendation:           - Resume regular diet.                           - Discharge patient to home today.                           - Repeat colonoscopy at age 54 for screening                            purposes due to suboptimal right colon preparation.                           -  Continue present medications. Procedure Code(s):        --- Professional ---                           [id number], Colonoscopy, flexible; diagnostic, including                            collection of specimen(s) by brushing or washing,                            when performed (separate procedure)                           99152, Moderate sedation services provided by the                            same physician or other qualified health care                            professional performing the diagnostic or                            therapeutic service that the sedation supports,                            requiring the presence of an independent trained                            observer to assist in the monitoring of the                            patient's level of consciousness and physiological                            status; initial 15 minutes of intraservice time,                            patient age 86 years or older Diagnosis Code(s):        --- Professional ---                           K64.0, First degree hemorrhoids                           K92.1, Melena (includes  Hematochezia)                           K57.30, Diverticulosis of large intestine without                            perforation or abscess without bleeding CPT copyright 2016 American Medical Association. All rights reserved. The codes documented in this report are preliminary and upon coder review may  be revised to meet current compliance requirements. Yetta Marceaux L. Myrtie Neither, MD 04/22/2016 11:33:56 AM This report has been signed electronically. Number of Addenda: 0

## 2016-04-23 ENCOUNTER — Encounter (HOSPITAL_COMMUNITY): Payer: Self-pay | Admitting: Gastroenterology

## 2016-05-11 ENCOUNTER — Ambulatory Visit
Admission: RE | Admit: 2016-05-11 | Discharge: 2016-05-11 | Disposition: A | Discharge status: Home / Self Care | Payer: Managed Care, Other (non HMO) | Source: Ambulatory Visit | Attending: Nurse Practitioner | Admitting: Nurse Practitioner

## 2016-05-11 ENCOUNTER — Other Ambulatory Visit: Payer: Self-pay | Admitting: Nurse Practitioner

## 2016-05-11 DIAGNOSIS — M25532 Pain in left wrist: Secondary | ICD-10-CM

## 2017-01-30 ENCOUNTER — Encounter (HOSPITAL_COMMUNITY): Payer: Self-pay | Admitting: Emergency Medicine

## 2017-01-30 DIAGNOSIS — R51 Headache: Secondary | ICD-10-CM | POA: Diagnosis not present

## 2017-01-30 DIAGNOSIS — I10 Essential (primary) hypertension: Secondary | ICD-10-CM | POA: Diagnosis present

## 2017-01-30 LAB — CBC
HCT: 42.6 % (ref 39.0–52.0)
HEMOGLOBIN: 14.3 g/dL (ref 13.0–17.0)
MCH: 29.5 pg (ref 26.0–34.0)
MCHC: 33.6 g/dL (ref 30.0–36.0)
MCV: 88 fL (ref 78.0–100.0)
PLATELETS: 231 10*3/uL (ref 150–400)
RBC: 4.84 MIL/uL (ref 4.22–5.81)
RDW: 11.7 % (ref 11.5–15.5)
WBC: 6 10*3/uL (ref 4.0–10.5)

## 2017-01-30 LAB — I-STAT TROPONIN, ED: TROPONIN I, POC: 0.01 ng/mL (ref 0.00–0.08)

## 2017-01-30 NOTE — ED Triage Notes (Signed)
Pt reports checking BP tonight and noted it to be high (201/136) Pt has a hx of HBP and has been compliant with taking meds. Pt is asymptomatic. Denies CP, SOB, HA, N/V, blurred vision, dizziness, etc.

## 2017-01-31 ENCOUNTER — Emergency Department (HOSPITAL_COMMUNITY)
Admission: EM | Admit: 2017-01-31 | Discharge: 2017-01-31 | Disposition: A | Discharge status: Home / Self Care | Payer: Managed Care, Other (non HMO) | Attending: Emergency Medicine | Admitting: Emergency Medicine

## 2017-01-31 ENCOUNTER — Emergency Department (HOSPITAL_COMMUNITY): Payer: Managed Care, Other (non HMO)

## 2017-01-31 DIAGNOSIS — I10 Essential (primary) hypertension: Secondary | ICD-10-CM

## 2017-01-31 HISTORY — DX: Diverticulosis of intestine, part unspecified, without perforation or abscess without bleeding: K57.90

## 2017-01-31 HISTORY — DX: Gout, unspecified: M10.9

## 2017-01-31 LAB — BASIC METABOLIC PANEL
Anion gap: 9 (ref 5–15)
BUN: 24 mg/dL — AB (ref 6–20)
CHLORIDE: 106 mmol/L (ref 101–111)
CO2: 23 mmol/L (ref 22–32)
CREATININE: 2.1 mg/dL — AB (ref 0.61–1.24)
Calcium: 9.1 mg/dL (ref 8.9–10.3)
GFR, EST AFRICAN AMERICAN: 42 mL/min — AB (ref >60)
GFR, EST NON AFRICAN AMERICAN: 36 mL/min — AB (ref >60)
Glucose, Bld: 86 mg/dL (ref 65–99)
POTASSIUM: 4.1 mmol/L (ref 3.5–5.1)
SODIUM: 138 mmol/L (ref 135–145)

## 2017-01-31 MED ORDER — AMLODIPINE BESYLATE 5 MG PO TABS
5.0000 mg | ORAL_TABLET | Freq: Once | ORAL | Status: AC
  Administered 2017-01-31: 5 mg via ORAL
  Filled 2017-01-31: qty 1

## 2017-01-31 MED ORDER — AMLODIPINE BESYLATE 5 MG PO TABS: 5.0000 mg | ORAL_TABLET | Freq: Every day | ORAL | 0 refills | Status: DC

## 2017-01-31 NOTE — Discharge Instructions (Signed)
Stop taking lisinopril.  Take amlodipine instead.  Monitor your blood pressure twice daily.  Follow-up up with your primary physician.  Return to the ED if you develop new or worsening symptoms.

## 2017-01-31 NOTE — ED Provider Notes (Signed)
[facility] EMERGENCY DEPARTMENT Provider Note   CSN: [id number] Arrival date & time: 01/30/17  2322     History   Chief Complaint Chief Complaint  Patient presents with  . Hypertension    HPI Chase Walsh is a 47 y.o. male.  Patient presents with elevated blood pressure.  States is been high for the past 1 week.  He was seen in urgent care last week for gout in his left foot and told to monitor his blood pressure every day.  He has been doing this and has been consistently elevated for the past week.  States it has been 160-180 systolic and 100-120 diastolic.  Tonight he found his blood pressure to be over 200 so he came to the hospital.  He does take lisinopril,  She has been taking for the past week but has not been compliant with otherwise.  He denies any symptoms from his high blood pressure.  No headache or blurry vision.  No chest pain.  No nausea vomiting or shortness of breath.  No focal weakness, numbness or tingling.  Has an appointment with his doctor in 2 days.   The history is provided by the patient.  Hypertension  Pertinent negatives include no chest pain, no abdominal pain, no headaches and no shortness of breath.    Past Medical History:  Diagnosis Date  . Diverticulosis   . Gout   . Hypertension   . Vitiligo     Patient Active Problem List   Diagnosis Date Noted  . Rectal bleeding 04/22/2016  . Diverticulosis of colon with hemorrhage   . Hematochezia 04/21/2016  . Vitiligo   . Hypertension   . Lower GI bleed   . Acute blood loss anemia     Past Surgical History:  Procedure Laterality Date  . COLONOSCOPY N/A 04/22/2016   Procedure: COLONOSCOPY;  Surgeon: Sherrilyn Rist, MD;  Location: [facility] ENDOSCOPY;  Service: Endoscopy;  Laterality: N/A;  . HERNIA REPAIR         Home Medications    Prior to Admission medications   Medication Sig Start Date End Date Taking? Authorizing Provider  amLODipine (NORVASC) 5 MG tablet Take 1  tablet (5 mg total) by mouth daily. 04/23/16   Pearson Grippe, MD    Family History Family History  Problem Relation Age of Onset  . Breast cancer Mother   . Arthritis Mother   . Cancer Paternal Uncle        maybe colon  . Inflammatory bowel disease Neg Hx     Social History Social History   Tobacco Use  . Smoking status: Never Smoker  . Smokeless tobacco: Never Used  Substance Use Topics  . Alcohol use: No  . Drug use: No     Allergies   Patient has no known allergies.   Review of Systems Review of Systems  Constitutional: Negative for activity change, appetite change and fever.  HENT: Negative for congestion and rhinorrhea.   Eyes: Negative for visual disturbance.  Respiratory: Negative for cough, chest tightness and shortness of breath.   Cardiovascular: Negative for chest pain.  Gastrointestinal: Negative for abdominal pain, nausea and vomiting.  Genitourinary: Negative for dysuria, hematuria and testicular pain.  Musculoskeletal: Negative for arthralgias and myalgias.  Skin: Negative for rash.  Neurological: Negative for dizziness, weakness and headaches.    all other systems are negative except as noted in the HPI and PMH.    Physical Exam Updated Vital Signs BP (!) 169/110  Pulse 62   Temp 98.2 F (36.8 C) (Oral)   Resp 20   Ht 5\' 9"  (1.753 m)   Wt 95.3 kg (210 lb)   SpO2 99%   BMI 31.01 kg/m   Physical Exam  Constitutional: He is oriented to person, place, and time. He appears well-developed and well-nourished. No distress.  HENT:  Head: Normocephalic and atraumatic.  Mouth/Throat: Oropharynx is clear and moist. No oropharyngeal exudate.  Eyes: Conjunctivae and EOM are normal. Pupils are equal, round, and reactive to light.  Neck: Normal range of motion. Neck supple.  No meningismus.  Cardiovascular: Normal rate, regular rhythm, normal heart sounds and intact distal pulses.  No murmur heard. Pulmonary/Chest: Effort normal and breath sounds  normal. No respiratory distress.  Abdominal: Soft. There is no tenderness. There is no rebound and no guarding.  Musculoskeletal: Normal range of motion. He exhibits no edema or tenderness.  Neurological: He is alert and oriented to person, place, and time. No cranial nerve deficit. He exhibits normal muscle tone. Coordination normal.  No ataxia on finger to nose bilaterally. No pronator drift. 5/5 strength throughout. CN 2-12 intact.Equal grip strength. Sensation intact.   Skin: Skin is warm.  Psychiatric: He has a normal mood and affect. His behavior is normal.  Nursing note and vitals reviewed.    ED Treatments / Results  Labs (all labs ordered are listed, but only abnormal results are displayed) Labs Reviewed  BASIC METABOLIC PANEL - Abnormal; Notable for the following components:      Result Value   BUN 24 (*)    Creatinine, Ser 2.10 (*)    GFR calc non Af Amer 36 (*)    GFR calc Af Amer 42 (*)    All other components within normal limits  CBC  I-STAT TROPONIN, ED    EKG  EKG Interpretation  Date/Time:  Tuesday January 30 2017 23:32:13 EST Ventricular Rate:  72 PR Interval:  184 QRS Duration: 86 QT Interval:  370 QTC Calculation: 405 R Axis:   51 Text Interpretation:  Normal sinus rhythm Right atrial enlargement T wave abnormality, consider inferior ischemia Abnormal ECG No previous ECGs available Confirmed by Glynn Octave [phone]) on 01/31/2017 1:18:17 AM       Radiology Ct Head Wo Contrast  Result Date: 01/31/2017 CLINICAL DATA:  Hypertension.  Chronic headache. EXAM: CT HEAD WITHOUT CONTRAST TECHNIQUE: Contiguous axial images were obtained from the base of the skull through the vertex without intravenous contrast. COMPARISON:  None. FINDINGS: Brain: No evidence of acute infarction, hemorrhage, hydrocephalus, extra-axial collection or mass lesion/mass effect. Vascular: No hyperdense vessel or unexpected calcification. Skull: Normal. Negative for fracture or focal  lesion. Sinuses/Orbits: No acute finding. Other: None. IMPRESSION: No acute intracranial abnormalities. Electronically Signed   By: Burman Nieves M.D.   On: 01/31/2017 01:52    Procedures Procedures (including critical care time)  Medications Ordered in ED Medications - No data to display   Initial Impression / Assessment and Plan / ED Course  I have reviewed the triage vital signs and the nursing notes.  Pertinent labs & imaging results that were available during my care of the patient were reviewed by me and considered in my medical decision making (see chart for details).    Patient with asymptomatic hypertension.  Has been elevated for the past week.  Recently restarted on his lisinopril.  Denies headache, chest pain or vision changes.  Labs show chronic creatinine elevation. Stable from previous values.  We will discuss stopping  lisinopril with patient switching to amlodipine.  Blood pressure is improved to 150/100 in the ED.  He is asymptomatic.  Discussed keeping blood pressure log and following up with PCP.  States he has an appointment in 2 days.  Return precautions discussed. Final Clinical Impressions(s) / ED Diagnoses   Final diagnoses:  Essential hypertension    ED Discharge Orders    None       Perfecto Purdy, Jeannett SeniorStephen, MD 01/31/17 [phone]

## 2017-01-31 NOTE — ED Notes (Signed)
Patient transported to CT 

## 2017-06-01 ENCOUNTER — Other Ambulatory Visit: Payer: Self-pay | Admitting: Nephrology

## 2017-06-01 DIAGNOSIS — N183 Chronic kidney disease, stage 3 unspecified: Secondary | ICD-10-CM

## 2017-06-25 ENCOUNTER — Ambulatory Visit
Admission: RE | Admit: 2017-06-25 | Discharge: 2017-06-25 | Disposition: A | Discharge status: Home / Self Care | Payer: Managed Care, Other (non HMO) | Source: Ambulatory Visit | Attending: Nephrology | Admitting: Nephrology

## 2017-06-25 DIAGNOSIS — N183 Chronic kidney disease, stage 3 unspecified: Secondary | ICD-10-CM

## 2017-07-24 ENCOUNTER — Other Ambulatory Visit: Payer: Self-pay | Admitting: Nephrology

## 2017-07-24 DIAGNOSIS — N183 Chronic kidney disease, stage 3 unspecified: Secondary | ICD-10-CM

## 2017-08-02 ENCOUNTER — Ambulatory Visit
Admission: RE | Admit: 2017-08-02 | Discharge: 2017-08-02 | Disposition: A | Discharge status: Home / Self Care | Payer: Managed Care, Other (non HMO) | Source: Ambulatory Visit | Attending: Nephrology | Admitting: Nephrology

## 2017-08-02 DIAGNOSIS — N183 Chronic kidney disease, stage 3 unspecified: Secondary | ICD-10-CM

## 2017-08-02 MED ORDER — GADOBENATE DIMEGLUMINE 529 MG/ML IV SOLN
10.0000 mL | Freq: Once | INTRAVENOUS | Status: AC | PRN
  Administered 2017-08-02: 10 mL via INTRAVENOUS

## 2017-12-17 ENCOUNTER — Encounter: Payer: Self-pay | Admitting: Nurse Practitioner

## 2017-12-17 ENCOUNTER — Ambulatory Visit (INDEPENDENT_AMBULATORY_CARE_PROVIDER_SITE_OTHER): Payer: Managed Care, Other (non HMO) | Admitting: Nurse Practitioner

## 2017-12-17 VITALS — BP 130/88 | HR 77 | Temp 98.1°F | Ht 67.4 in | Wt 214.6 lb

## 2017-12-17 DIAGNOSIS — Z125 Encounter for screening for malignant neoplasm of prostate: Secondary | ICD-10-CM | POA: Diagnosis not present

## 2017-12-17 DIAGNOSIS — N183 Chronic kidney disease, stage 3 unspecified: Secondary | ICD-10-CM

## 2017-12-17 DIAGNOSIS — Z0000 Encounter for general adult medical examination without abnormal findings: Secondary | ICD-10-CM | POA: Diagnosis not present

## 2017-12-17 DIAGNOSIS — I129 Hypertensive chronic kidney disease with stage 1 through stage 4 chronic kidney disease, or unspecified chronic kidney disease: Secondary | ICD-10-CM

## 2017-12-17 DIAGNOSIS — Z6833 Body mass index (BMI) 33.0-33.9, adult: Secondary | ICD-10-CM

## 2017-12-17 DIAGNOSIS — M1A9XX0 Chronic gout, unspecified, without tophus (tophi): Secondary | ICD-10-CM

## 2017-12-17 DIAGNOSIS — E6609 Other obesity due to excess calories: Secondary | ICD-10-CM

## 2017-12-17 DIAGNOSIS — I10 Essential (primary) hypertension: Secondary | ICD-10-CM

## 2017-12-17 LAB — POCT UA - MICROALBUMIN
ALBUMIN/CREATININE RATIO, URINE, POC: 300
Creatinine, POC: 100 mg/dL
Microalbumin Ur, POC: 150 mg/L

## 2017-12-17 LAB — POCT URINALYSIS DIPSTICK
BILIRUBIN UA: NEGATIVE
Blood, UA: NEGATIVE
Glucose, UA: NEGATIVE
KETONES UA: NEGATIVE
Leukocytes, UA: NEGATIVE
NITRITE UA: NEGATIVE
Protein, UA: POSITIVE — AB
Spec Grav, UA: 1.01 (ref 1.010–1.025)
Urobilinogen, UA: 0.2 E.U./dL
pH, UA: 5.5 (ref 5.0–8.0)

## 2017-12-17 NOTE — Progress Notes (Signed)
Subjective:     Patient ID: Chase Walsh , male    DOB: 1970-01-28 , 47 y.o.   MRN: [id number]   Chief Complaint  Patient presents with  . Annual Exam  . Rash   Men's preventive visit. Patient Health Questionnaire (PHQ-2) is     Office Visit from 12/17/2017 in [facility]  PHQ-2 Total Score  0     Patient is on a Gout diet. Marital status: Married. Relevant history for alcohol use is:   Social History   Substance and Sexual Activity  Alcohol Use No  . Relevant history for tobacco use is:  Social History   Tobacco Use  Smoking Status Never Smoker  Smokeless Tobacco Never Used   exercises with work only.    HPI  Rash   Hypertension  This is a chronic problem. The current episode started more than 1 year ago. The problem is unchanged. The problem is controlled. Pertinent negatives include no anxiety. There are no associated agents to hypertension. Risk factors for coronary artery disease include sedentary lifestyle. Past treatments include nothing.     Past Medical History:  Diagnosis Date  . Diverticulosis   . Gout   . Hypertension   . Vitiligo      Family History  Problem Relation Age of Onset  . Breast cancer Mother   . Arthritis Mother   . Cancer Paternal Uncle        maybe colon  . Inflammatory bowel disease Neg Hx      Current Outpatient Medications:  .  albuterol (PROVENTIL HFA;VENTOLIN HFA) 108 (90 Base) MCG/ACT inhaler, Inhale 2 puffs into the lungs every 6 (six) hours as needed for wheezing or shortness of breath., Disp: , Rfl:  .  amLODipine (NORVASC) 10 MG tablet, Take 10 mg by mouth daily., Disp: , Rfl:  .  febuxostat (ULORIC) 40 MG tablet, Take 40 mg by mouth as needed., Disp: , Rfl:  .  fluocinonide (LIDEX) 0.05 % external solution, Apply 1 application topically as needed., Disp: , Rfl:    No Known Allergies   Review of Systems  Constitutional: Negative.   HENT: Negative.   Eyes: Negative.   Respiratory:  Negative.   Cardiovascular: Negative.   Gastrointestinal: Negative.   Endocrine: Negative.   Genitourinary: Negative.   Musculoskeletal: Negative.   Skin: Positive for rash.  Allergic/Immunologic: Negative.   Neurological: Negative.   Hematological: Negative.   Psychiatric/Behavioral: Negative.      Today's Vitals   12/17/17 0956  BP: 130/88  Pulse: 77  Temp: 98.1 F (36.7 C)  TempSrc: Oral  SpO2: 97%  Weight: 214 lb 9.6 oz (97.3 kg)  Height: 5' 7.4" (1.712 m)  PainSc: 0-No pain   Body mass index is 33.21 kg/m.   Objective:  Physical Exam Vitals signs reviewed.  Constitutional:      Appearance: Normal appearance.  HENT:     Head: Normocephalic and atraumatic.     Right Ear: Tympanic membrane normal. There is no impacted cerumen.     Left Ear: Tympanic membrane normal. There is no impacted cerumen.     Nose: Nose normal. No congestion.     Mouth/Throat:     Mouth: Mucous membranes are moist.  Eyes:     Pupils: Pupils are equal, round, and reactive to light.  Neck:     Musculoskeletal: Normal range of motion and neck supple.  Cardiovascular:     Rate and Rhythm: Normal rate.     Pulses:  Normal pulses.     Heart sounds: Normal heart sounds.  Pulmonary:     Effort: Pulmonary effort is normal.     Breath sounds: Normal breath sounds.  Abdominal:     General: Abdomen is flat.  Genitourinary:    Prostate: Normal.  Musculoskeletal: Normal range of motion.        General: Tenderness present.  Skin:    General: Skin is dry.     Capillary Refill: Capillary refill takes less than 2 seconds.  Neurological:     General: No focal deficit present.     Mental Status: Chase Walsh is alert.  Psychiatric:        Mood and Affect: Mood normal.         Assessment And Plan:     1. Routine general medical examination at a health care facility . Behavior modifications discussed and diet history reviewed.   . Pt will continue to exercise regularly and modify diet with low GI,  plant based foods and decrease intake of processed foods.  . Recommend intake of daily multivitamin, Vitamin D, and calcium.  . Recommend for preventive screenings, as well as recommend immunizations that include influenza, TDAP - EKG 12-Lead  2. Encounter for prostate cancer screening  - PSA(Must document that pt has been informed of limitations of PSA testing.)  3. Essential hypertension . B/P is fairly controlled.  . CMP ordered to check renal function.  . The importance of regular exercise and dietary modification was stressed to the patient.  . Stressed importance of losing ten percent of her body weight to help with B/P control. The weight loss would help with decreasing cardiac and cancer risk as well.    4. Body mass index (BMI) of 33.0-33.9 in adult   5. Class 1 obesity due to excess calories with serious comorbidity and body mass index (BMI) of 33.0 to 33.9 in adult  Chronic  Discussed healthy diet and regular exercise options   Encouraged to exercise at least 150 minutes per week with 2 days of strength training  6. Chronic gout without tophus, unspecified cause, unspecified site  Chronic, controlled  Continue with current medications  No recent exacerbations - Uric acid  7. Stage 3 chronic kidney disease (HCC)  Chase Walsh is being followed by Nephrology  Aware to avoid NSAIDs      Arnette FeltsJanece Zacarias Krauter, FNP

## 2017-12-17 NOTE — Patient Instructions (Signed)
Health Maintenance, Male A healthy lifestyle and preventive care is important for your health and wellness. Ask your health care provider about what schedule of regular examinations is right for you. What should I know about weight and diet? Eat a Healthy Diet  Eat plenty of vegetables, fruits, whole grains, low-fat dairy products, and lean protein.  Do not eat a lot of foods high in solid fats, added sugars, or salt.  Maintain a Healthy Weight Regular exercise can help you achieve or maintain a healthy weight. You should:  Do at least 150 minutes of exercise each week. The exercise should increase your heart rate and make you sweat (moderate-intensity exercise).  Do strength-training exercises at least twice a week.  Watch Your Levels of Cholesterol and Blood Lipids  Have your blood tested for lipids and cholesterol every 5 years starting at 48 years of age. If you are at high risk for heart disease, you should start having your blood tested when you are 48 years old. You may need to have your cholesterol levels checked more often if: ? Your lipid or cholesterol levels are high. ? You are older than 48 years of age. ? You are at high risk for heart disease.  What should I know about cancer screening? Many types of cancers can be detected early and may often be prevented. Lung Cancer  You should be screened every year for lung cancer if: ? You are a current smoker who has smoked for at least 30 years. ? You are a former smoker who has quit within the past 15 years.  Talk to your health care provider about your screening options, when you should start screening, and how often you should be screened.  Colorectal Cancer  Routine colorectal cancer screening usually begins at 48 years of age and should be repeated every 5-10 years until you are 48 years old. You may need to be screened more often if early forms of precancerous polyps or small growths are found. Your health care  provider may recommend screening at an earlier age if you have risk factors for colon cancer.  Your health care provider may recommend using home test kits to check for hidden blood in the stool.  A small camera at the end of a tube can be used to examine your colon (sigmoidoscopy or colonoscopy). This checks for the earliest forms of colorectal cancer.  Prostate and Testicular Cancer  Depending on your age and overall health, your health care provider may do certain tests to screen for prostate and testicular cancer.  Talk to your health care provider about any symptoms or concerns you have about testicular or prostate cancer.  Skin Cancer  Check your skin from head to toe regularly.  Tell your health care provider about any new moles or changes in moles, especially if: ? There is a change in a mole's size, shape, or color. ? You have a mole that is larger than a pencil eraser.  Always use sunscreen. Apply sunscreen liberally and repeat throughout the day.  Protect yourself by wearing long sleeves, pants, a wide-brimmed hat, and sunglasses when outside.  What should I know about heart disease, diabetes, and high blood pressure?  If you are 44-22 years of age, have your blood pressure checked every 3-5 years. If you are 63 years of age or older, have your blood pressure checked every year. You should have your blood pressure measured twice-once when you are at a hospital or clinic, and once  when you are not at a hospital or clinic. Record the average of the two measurements. To check your blood pressure when you are not at a hospital or clinic, you can use: ? An automated blood pressure machine at a pharmacy. ? A home blood pressure monitor.  Talk to your health care provider about your target blood pressure.  If you are between 61-70 years old, ask your health care provider if you should take aspirin to prevent heart disease.  Have regular diabetes screenings by checking your  fasting blood sugar level. ? If you are at a normal weight and have a low risk for diabetes, have this test once every three years after the age of 70. ? If you are overweight and have a high risk for diabetes, consider being tested at a younger age or more often.  A one-time screening for abdominal aortic aneurysm (AAA) by ultrasound is recommended for men aged 65-75 years who are current or former smokers. What should I know about preventing infection? Hepatitis B If you have a higher risk for hepatitis B, you should be screened for this virus. Talk with your health care provider to find out if you are at risk for hepatitis B infection. Hepatitis C Blood testing is recommended for:  Everyone born from [birth year] through 1966.  Anyone with known risk factors for hepatitis C.  Sexually Transmitted Diseases (STDs)  You should be screened each year for STDs including gonorrhea and chlamydia if: ? You are sexually active and are younger than 48 years of age. ? You are older than 48 years of age and your health care provider tells you that you are at risk for this type of infection. ? Your sexual activity has changed since you were last screened and you are at an increased risk for chlamydia or gonorrhea. Ask your health care provider if you are at risk.  Talk with your health care provider about whether you are at high risk of being infected with HIV. Your health care provider may recommend a prescription medicine to help prevent HIV infection.  What else can I do?  Schedule regular health, dental, and eye exams.  Stay current with your vaccines (immunizations).  Do not use any tobacco products, such as cigarettes, chewing tobacco, and e-cigarettes. If you need help quitting, ask your health care provider.  Limit alcohol intake to no more than 2 drinks per day. One drink equals 12 ounces of beer, 5 ounces of wine, or 1 ounces of hard liquor.  Do not use street drugs.  Do not share  needles.  Ask your health care provider for help if you need support or information about quitting drugs.  Tell your health care provider if you often feel depressed.  Tell your health care provider if you have ever been abused or do not feel safe at home. This information is not intended to replace advice given to you by your health care provider. Make sure you discuss any questions you have with your health care provider. Document Released: 06/17/2007 Document Revised: 08/18/2015 Document Reviewed: 09/22/2014 Elsevier Interactive Patient Education  2018 [employer].   Fat and Cholesterol Restricted Diet Getting too much fat and cholesterol in your diet may cause health problems. Following this diet helps keep your fat and cholesterol at normal levels. This can keep you from getting sick. What types of fat should I choose?  Choose monosaturated and polyunsaturated fats. These are found in foods such as olive oil, canola oil, flaxseeds,  walnuts, almonds, and seeds.  Eat more omega-3 fats. Good choices include salmon, mackerel, sardines, tuna, flaxseed oil, and ground flaxseeds.  Limit saturated fats. These are in animal products such as meats, butter, and cream. They can also be in plant products such as palm oil, palm kernel oil, and coconut oil.  Avoid foods with partially hydrogenated oils in them. These contain trans fats. Examples of foods that have trans fats are stick margarine, some tub margarines, cookies, crackers, and other baked goods. What general guidelines do I need to follow?  Check food labels. Look for the words "trans fat" and "saturated fat."  When preparing a meal: ? Fill half of your plate with vegetables and green salads. ? Fill one fourth of your plate with whole grains. Look for the word "whole" as the first word in the ingredient list. ? Fill one fourth of your plate with lean protein foods.  Eat more foods that have fiber, like apples, carrots, beans,  peas, and barley.  Eat more home-cooked foods. Eat less at restaurants and buffets.  Limit or avoid alcohol.  Limit foods high in starch and sugar.  Limit fried foods.  Cook foods without frying them. Baking, boiling, grilling, and broiling are all great options.  Lose weight if you are overweight. Losing even a small amount of weight can help your overall health. It can also help prevent diseases such as diabetes and heart disease. What foods can I eat? Grains Whole grains, such as whole wheat or whole grain breads, crackers, cereals, and pasta. Unsweetened oatmeal, bulgur, barley, quinoa, or brown rice. Corn or whole wheat flour tortillas. Vegetables Fresh or frozen vegetables (raw, steamed, roasted, or grilled). Green salads. Fruits All fresh, canned (in natural juice), or frozen fruits. Meat and Other Protein Products Ground beef (85% or leaner), grass-fed beef, or beef trimmed of fat. Skinless chicken or [place]. Ground chicken or [place]. Pork trimmed of fat. All fish and seafood. Eggs. Dried beans, peas, or lentils. Unsalted nuts or seeds. Unsalted canned or dry beans. Dairy Low-fat dairy products, such as skim or 1% milk, 2% or reduced-fat cheeses, low-fat ricotta or cottage cheese, or plain low-fat yogurt. Fats and Oils Tub margarines without trans fats. Light or reduced-fat mayonnaise and salad dressings. Avocado. Olive, canola, sesame, or safflower oils. Natural peanut or almond butter (choose ones without added sugar and oil). The items listed above may not be a complete list of recommended foods or beverages. Contact your dietitian for more options. What foods are not recommended? Grains White bread. White pasta. White rice. Cornbread. Bagels, pastries, and croissants. Crackers that contain trans fat. Vegetables White potatoes. Corn. Creamed or fried vegetables. Vegetables in a cheese sauce. Fruits Dried fruits. Canned fruit in light or heavy syrup. Fruit juice. Meat and  Other Protein Products Fatty cuts of meat. Ribs, chicken wings, bacon, sausage, bologna, salami, chitterlings, fatback, hot dogs, bratwurst, and packaged luncheon meats. Liver and organ meats. Dairy Whole or 2% milk, cream, half-and-half, and cream cheese. Whole milk cheeses. Whole-fat or sweetened yogurt. Full-fat cheeses. Nondairy creamers and whipped toppings. Processed cheese, cheese spreads, or cheese curds. Sweets and Desserts Corn syrup, sugars, honey, and molasses. Candy. Jam and jelly. Syrup. Sweetened cereals. Cookies, pies, cakes, donuts, muffins, and ice cream. Fats and Oils Butter, stick margarine, lard, shortening, ghee, or bacon fat. Coconut, palm kernel, or palm oils. Beverages Alcohol. Sweetened drinks (such as sodas, lemonade, and fruit drinks or punches). The items listed above may not be a complete list  of foods and beverages to avoid. Contact your dietitian for more information. This information is not intended to replace advice given to you by your health care provider. Make sure you discuss any questions you have with your health care provider. Document Released: 06/20/2011 Document Revised: 08/26/2015 Document Reviewed: 03/20/2013 Elsevier Interactive Patient Education  [employer]2018 [employer]

## 2017-12-18 LAB — CMP14 + ANION GAP
A/G RATIO: 1.4 (ref 1.2–2.2)
ALBUMIN: 4.7 g/dL (ref 3.5–5.5)
ALT: 27 IU/L (ref 0–44)
AST: 34 IU/L (ref 0–40)
Alkaline Phosphatase: 82 IU/L (ref 39–117)
Anion Gap: 19 mmol/L — ABNORMAL HIGH (ref 10.0–18.0)
BUN/Creatinine Ratio: 9 (ref 9–20)
BUN: 20 mg/dL (ref 6–24)
Bilirubin Total: 0.5 mg/dL (ref 0.0–1.2)
CALCIUM: 9.6 mg/dL (ref 8.7–10.2)
CO2: 18 mmol/L — ABNORMAL LOW (ref 20–29)
Chloride: 102 mmol/L (ref 96–106)
Creatinine, Ser: 2.15 mg/dL — ABNORMAL HIGH (ref 0.76–1.27)
GFR, EST AFRICAN AMERICAN: 41 mL/min/{1.73_m2} — AB (ref >59)
GFR, EST NON AFRICAN AMERICAN: 35 mL/min/{1.73_m2} — AB (ref >59)
Globulin, Total: 3.4 g/dL (ref 1.5–4.5)
Glucose: 86 mg/dL (ref 65–99)
POTASSIUM: 4.6 mmol/L (ref 3.5–5.2)
Sodium: 139 mmol/L (ref 134–144)
TOTAL PROTEIN: 8.1 g/dL (ref 6.0–8.5)

## 2017-12-18 LAB — CBC
HEMATOCRIT: 39.6 % (ref 37.5–51.0)
HEMOGLOBIN: 13.7 g/dL (ref 13.0–17.7)
MCH: 29.1 pg (ref 26.6–33.0)
MCHC: 34.6 g/dL (ref 31.5–35.7)
MCV: 84 fL (ref 79–97)
Platelets: 256 10*3/uL (ref 150–450)
RBC: 4.7 x10E6/uL (ref 4.14–5.80)
RDW: 11 % — ABNORMAL LOW (ref 12.3–15.4)
WBC: 6.4 10*3/uL (ref 3.4–10.8)

## 2017-12-18 LAB — LIPID PANEL
CHOL/HDL RATIO: 6.9 ratio — AB (ref 0.0–5.0)
Cholesterol, Total: 229 mg/dL — ABNORMAL HIGH (ref 100–199)
HDL: 33 mg/dL — ABNORMAL LOW (ref >39)
LDL CALC: 159 mg/dL — AB (ref 0–99)
Triglycerides: 184 mg/dL — ABNORMAL HIGH (ref 0–149)
VLDL Cholesterol Cal: 37 mg/dL (ref 5–40)

## 2017-12-18 LAB — URIC ACID: URIC ACID: 10.2 mg/dL — AB (ref 3.7–8.6)

## 2017-12-18 LAB — PSA: Prostate Specific Ag, Serum: 0.7 ng/mL (ref 0.0–4.0)

## 2018-03-18 ENCOUNTER — Other Ambulatory Visit: Payer: Self-pay

## 2018-03-18 ENCOUNTER — Encounter: Payer: Self-pay | Admitting: Nurse Practitioner

## 2018-03-18 ENCOUNTER — Ambulatory Visit (INDEPENDENT_AMBULATORY_CARE_PROVIDER_SITE_OTHER): Payer: Managed Care, Other (non HMO) | Admitting: Nurse Practitioner

## 2018-03-18 VITALS — BP 140/72 | HR 73 | Temp 97.6°F | Ht 68.4 in | Wt 214.6 lb

## 2018-03-18 DIAGNOSIS — M109 Gout, unspecified: Secondary | ICD-10-CM | POA: Diagnosis not present

## 2018-03-18 DIAGNOSIS — M1A0620 Idiopathic chronic gout, left knee, without tophus (tophi): Secondary | ICD-10-CM | POA: Insufficient documentation

## 2018-03-18 MED ORDER — PREDNISONE 10 MG (21) PO TBPK: ORAL_TABLET | ORAL | 0 refills | Status: DC

## 2018-03-18 NOTE — Progress Notes (Signed)
  Subjective:     Patient ID: Chase Walsh , male    DOB: January 30, 1970 , 48 y.o.   MRN: [id number]   Chief Complaint  Patient presents with  . Gout    saturday-elbo-getting better but still bad    HPI  Elbow pain - since Saturday, getting better since then.  Rating pain at 5/10.  He has not been able to work due to the pain and discomfort. Ginger tea and tart cherry juice.        Past Medical History:  Diagnosis Date  . Diverticulosis   . Gout   . Hypertension   . Vitiligo      Family History  Problem Relation Age of Onset  . Breast cancer Mother   . Arthritis Mother   . Cancer Paternal Uncle        maybe colon  . Inflammatory bowel disease Neg Hx      Current Outpatient Medications:  .  albuterol (PROVENTIL HFA;VENTOLIN HFA) 108 (90 Base) MCG/ACT inhaler, Inhale 2 puffs into the lungs every 6 (six) hours as needed for wheezing or shortness of breath., Disp: , Rfl:  .  amLODipine (NORVASC) 10 MG tablet, Take 10 mg by mouth daily., Disp: , Rfl:  .  febuxostat (ULORIC) 40 MG tablet, Take 40 mg by mouth as needed., Disp: , Rfl:  .  fluocinonide (LIDEX) 0.05 % external solution, Apply 1 application topically as needed., Disp: , Rfl:    No Known Allergies   Review of Systems  Constitutional: Negative for fatigue and fever.  Respiratory: Negative for cough.   Cardiovascular: Negative for chest pain, palpitations and leg swelling.  Musculoskeletal: Positive for arthralgias (right elbow pain). Negative for joint swelling and myalgias.     Today's Vitals   03/18/18 1526  BP: 140/72  Pulse: 73  Temp: 97.6 F (36.4 C)  TempSrc: Oral  SpO2: 97%  Weight: 214 lb 9.6 oz (97.3 kg)  Height: 5' 8.4" (1.737 m)   Body mass index is 32.25 kg/m.   Objective:  Physical Exam Constitutional:      Appearance: Normal appearance.  Cardiovascular:     Pulses: Normal pulses.     Heart sounds: Normal heart sounds.  Neurological:     Mental Status: He is alert.          Assessment And Plan:     1. Acute gout of right elbow, unspecified cause  Left elbow swollen and tender will treat with prednisone taper - predniSONE (STERAPRED UNI-PAK 21 TAB) 10 MG (21) TBPK tablet; Take as directed  Dispense: 21 tablet; Refill: 0  [name], FNP

## 2018-04-05 ENCOUNTER — Encounter: Payer: Self-pay | Admitting: Nurse Practitioner

## 2018-06-04 ENCOUNTER — Telehealth: Payer: Self-pay

## 2018-06-04 NOTE — Telephone Encounter (Signed)
Patient called requesting an appointment to have covid testing done he stated he does not have any symptoms but someone at his job tested positive. Patient consented to virtual appt. YRL,RMA

## 2018-06-05 ENCOUNTER — Ambulatory Visit (INDEPENDENT_AMBULATORY_CARE_PROVIDER_SITE_OTHER): Payer: Managed Care, Other (non HMO) | Admitting: Nurse Practitioner

## 2018-06-05 ENCOUNTER — Encounter: Payer: Self-pay | Admitting: Nurse Practitioner

## 2018-06-05 ENCOUNTER — Telehealth: Payer: Self-pay

## 2018-06-05 ENCOUNTER — Other Ambulatory Visit: Payer: Self-pay

## 2018-06-05 VITALS — BP 179/117 | HR 60 | Ht 69.0 in | Wt 213.0 lb

## 2018-06-05 DIAGNOSIS — Z20822 Contact with and (suspected) exposure to covid-19: Secondary | ICD-10-CM

## 2018-06-05 DIAGNOSIS — Z20828 Contact with and (suspected) exposure to other viral communicable diseases: Secondary | ICD-10-CM | POA: Insufficient documentation

## 2018-06-05 DIAGNOSIS — I10 Essential (primary) hypertension: Secondary | ICD-10-CM | POA: Diagnosis not present

## 2018-06-05 NOTE — Telephone Encounter (Signed)
[facility] request COVID 19 test.

## 2018-06-05 NOTE — Progress Notes (Signed)
Virtual Visit via video   This visit type was conducted due to national recommendations for restrictions regarding the COVID-19 Pandemic (e.g. social distancing) in an effort to limit this patient's exposure and mitigate transmission in our community.  Patients identity confirmed using two different identifiers.  This format is felt to be most appropriate for this patient at this time.  All issues noted in this document were discussed and addressed.  No physical exam was performed (except for noted visual exam findings with Video Visits).    Date:  06/09/2018   ID:  Chase Walsh, DOB 1970/09/27, MRN [id number]  Patient Location:  Home - spoke with Chase Walsh   Provider location:   Office    Chief Complaint:  Co-workers positivein   History of Present Illness:    Chase Walsh is a 48 y.o. male who presents via video conferencing for a telehealth visit today.    The patient does not have symptoms concerning for COVID-19 infection (fever, chills, cough, or new shortness of breath).   Everything he gets has to go through at work comes to his department.  Works at [employer].  Found out yesterday that 3 days.    Hypertension  This is a chronic problem. The current episode started more than 1 year ago. The problem is uncontrolled. Pertinent negatives include no anxiety or chest pain. Past treatments include calcium channel blockers. The current treatment provides moderate improvement. There are no compliance problems.  There is no history of angina.     Past Medical History:  Diagnosis Date  . Diverticulosis   . Gout   . Hypertension   . Vitiligo    Past Surgical History:  Procedure Laterality Date  . COLONOSCOPY N/A 04/22/2016   Procedure: COLONOSCOPY;  Surgeon: Sherrilyn RistHenry L Danis III, MD;  Location: [facility];  Service: Endoscopy;  Laterality: N/A;  . HERNIA REPAIR       Current Meds  Medication Sig  . albuterol (PROVENTIL HFA;VENTOLIN HFA) 108 (90 Base) MCG/ACT inhaler  Inhale 2 puffs into the lungs every 6 (six) hours as needed for wheezing or shortness of breath.  Marland Kitchen. amLODipine (NORVASC) 10 MG tablet Take 10 mg by mouth daily.  . colchicine 0.6 MG tablet Take 0.6 mg by mouth daily.  . fluocinonide (LIDEX) 0.05 % external solution Apply 1 application topically as needed.     Allergies:   Patient has no known allergies.   Social History   Tobacco Use  . Smoking status: Never Smoker  . Smokeless tobacco: Never Used  Substance Use Topics  . Alcohol use: No  . Drug use: No     Family Hx: The patient's family history includes Arthritis in his mother; Breast cancer in his mother; Cancer in his paternal uncle. There is no history of Inflammatory bowel disease.  ROS:   Please see the history of present illness.    Review of Systems  Constitutional: Negative.   Respiratory: Negative.   Cardiovascular: Negative.  Negative for chest pain.  Musculoskeletal: Negative.   Neurological: Negative for dizziness and tingling.    All other systems reviewed and are negative.   Labs/Other Tests and Data Reviewed:    Recent Labs: 12/17/2017: ALT 27; BUN 20; Creatinine, Ser 2.15; Hemoglobin 13.7; Platelets 256; Potassium 4.6; Sodium 139   Recent Lipid Panel Lab Results  Component Value Date/Time   CHOL 229 (H) 12/17/2017 10:50 AM   TRIG 184 (H) 12/17/2017 10:50 AM   HDL 33 (L) 12/17/2017 10:50 AM  CHOLHDL 6.9 (H) 12/17/2017 10:50 AM   LDLCALC 159 (H) 12/17/2017 10:50 AM    Wt Readings from Last 3 Encounters:  06/05/18 213 lb (96.6 kg)  03/18/18 214 lb 9.6 oz (97.3 kg)  12/17/17 214 lb 9.6 oz (97.3 kg)     Exam:    Vital Signs:  BP (!) 179/117 (BP Location: Left Arm, Patient Position: Sitting, Cuff Size: Large)   Pulse 60   Ht 5\' 9"  (1.753 m)   Wt 213 lb (96.6 kg)   BMI 31.45 kg/m     Physical Exam  Constitutional: He is oriented to person, place, and time and well-developed, well-nourished, and in no distress.  Neurological: He is alert  and oriented to person, place, and time.  Psychiatric: Mood, memory, affect and judgment normal.    ASSESSMENT & PLAN:    1. Essential hypertension  Blood pressure is elevated and he relates to not sleeping well the last 3 nights  I have advised him to take his medications and to recheck his blood pressure and to send via mychart  He denies he has missed any doses of his medications  2. Exposure to Covid-19 Virus  Will send to [facility] for covid testing due to possible exposure at work, works at [employer]   COVID-19 Education: The signs and symptoms of COVID-19 were discussed with the patient and how to seek care for testing (follow up with PCP or arrange E-visit).  The importance of social distancing was discussed today.  Patient Risk:   After full review of this patients clinical status, I feel that they are at least moderate risk at this time.  Time:   Today, I have spent 11 minutes/ seconds with the patient with telehealth technology discussing above diagnoses.     Medication Adjustments/Labs and Tests Ordered: Current medicines are reviewed at length with the patient today.  Concerns regarding medicines are outlined above.   Tests Ordered: No orders of the defined types were placed in this encounter.   Medication Changes: No orders of the defined types were placed in this encounter.   Disposition:  Follow up 1 month with labs  Signed, [name], FNP

## 2018-06-06 ENCOUNTER — Other Ambulatory Visit: Payer: Managed Care, Other (non HMO)

## 2018-06-06 ENCOUNTER — Telehealth: Payer: Self-pay

## 2018-06-06 DIAGNOSIS — Z20822 Contact with and (suspected) exposure to covid-19: Secondary | ICD-10-CM

## 2018-06-06 NOTE — Telephone Encounter (Signed)
I called pt to see if he checked his blood pressure yet since he takes his med at 3pm patient stated he has not checked it yet he stated he was sleeping and will check it later pt also let us know that  olmesartan medoxomil was prescribed by Dr.Upton. YRL,RMA

## 2018-06-08 LAB — NOVEL CORONAVIRUS, NAA: SARS-CoV-2, NAA: NOT DETECTED

## 2018-06-19 ENCOUNTER — Ambulatory Visit: Payer: Managed Care, Other (non HMO) | Admitting: Internal Medicine

## 2018-07-08 ENCOUNTER — Encounter: Payer: Self-pay | Admitting: Nurse Practitioner

## 2018-07-08 ENCOUNTER — Other Ambulatory Visit: Payer: Self-pay

## 2018-07-08 ENCOUNTER — Ambulatory Visit (INDEPENDENT_AMBULATORY_CARE_PROVIDER_SITE_OTHER): Payer: Managed Care, Other (non HMO) | Admitting: Nurse Practitioner

## 2018-07-08 VITALS — BP 120/78 | HR 81 | Temp 98.9°F | Ht 68.4 in | Wt 214.0 lb

## 2018-07-08 DIAGNOSIS — E66811 Obesity, class 1: Secondary | ICD-10-CM

## 2018-07-08 DIAGNOSIS — N183 Chronic kidney disease, stage 3 unspecified: Secondary | ICD-10-CM | POA: Insufficient documentation

## 2018-07-08 DIAGNOSIS — I10 Essential (primary) hypertension: Secondary | ICD-10-CM

## 2018-07-08 DIAGNOSIS — Z6833 Body mass index (BMI) 33.0-33.9, adult: Secondary | ICD-10-CM

## 2018-07-08 DIAGNOSIS — M1A0620 Idiopathic chronic gout, left knee, without tophus (tophi): Secondary | ICD-10-CM | POA: Diagnosis not present

## 2018-07-08 DIAGNOSIS — E6609 Other obesity due to excess calories: Secondary | ICD-10-CM | POA: Diagnosis not present

## 2018-07-08 DIAGNOSIS — I129 Hypertensive chronic kidney disease with stage 1 through stage 4 chronic kidney disease, or unspecified chronic kidney disease: Secondary | ICD-10-CM | POA: Diagnosis not present

## 2018-07-08 DIAGNOSIS — Z6832 Body mass index (BMI) 32.0-32.9, adult: Secondary | ICD-10-CM

## 2018-07-08 HISTORY — DX: Other obesity due to excess calories: E66.09

## 2018-07-08 HISTORY — DX: Obesity, class 1: E66.811

## 2018-07-08 MED ORDER — AMLODIPINE BESYLATE 10 MG PO TABS: 10.0000 mg | ORAL_TABLET | Freq: Every day | ORAL | 1 refills | Status: DC

## 2018-07-08 NOTE — Progress Notes (Signed)
Subjective:     Patient ID: Chase Walsh , male    DOB: 05/31/1970 , 48 y.o.   MRN: [id number]   Chief Complaint  Patient presents with  . Hypertension    HPI  Hypertension This is a chronic problem. The current episode started more than 1 year ago. The problem is unchanged. The problem is controlled. Pertinent negatives include no anxiety, chest pain, headaches or palpitations. There are no associated agents to hypertension. There are no known risk factors for coronary artery disease. Past treatments include calcium channel blockers and angiotensin blockers. The current treatment provides no improvement. There are no compliance problems.  There is no history of angina. There is no history of chronic renal disease.     Past Medical History:  Diagnosis Date  . Diverticulosis   . Gout   . Hypertension   . Vitiligo      Family History  Problem Relation Age of Onset  . Breast cancer Mother   . Arthritis Mother   . Cancer Paternal Uncle        maybe colon  . Inflammatory bowel disease Neg Hx      Current Outpatient Medications:  .  albuterol (PROVENTIL HFA;VENTOLIN HFA) 108 (90 Base) MCG/ACT inhaler, Inhale 2 puffs into the lungs every 6 (six) hours as needed for wheezing or shortness of breath., Disp: , Rfl:  .  amLODipine (NORVASC) 10 MG tablet, Take 10 mg by mouth daily., Disp: , Rfl:  .  colchicine 0.6 MG tablet, Take 0.6 mg by mouth daily., Disp: , Rfl:  .  fluocinonide (LIDEX) 0.05 % external solution, Apply 1 application topically as needed., Disp: , Rfl:  .  OLMESARTAN MEDOXOMIL PO, Take 1 tablet by mouth daily. Take 1 tablet 65m daily, Disp: , Rfl:    No Known Allergies   Review of Systems  Constitutional: Negative.   Respiratory: Negative.   Cardiovascular: Negative.  Negative for chest pain, palpitations and leg swelling.  Neurological: Negative for dizziness and headaches.     Today's Vitals   07/08/18 1451  BP: 120/78  Pulse: 81  Temp: 98.9 F (37.2 C)   TempSrc: Oral  Weight: 214 lb (97.1 kg)  Height: 5' 8.4" (1.737 m)  PainSc: 0-No pain   Body mass index is 32.16 kg/m.   Objective:  Physical Exam Vitals signs reviewed.  Constitutional:      Appearance: Normal appearance.  Cardiovascular:     Rate and Rhythm: Normal rate and regular rhythm.  Skin:    General: Skin is warm and dry.     Capillary Refill: Capillary refill takes less than 2 seconds.  Neurological:     General: No focal deficit present.     Mental Status: He is alert and oriented to person, place, and time.  Psychiatric:        Mood and Affect: Mood normal.        Behavior: Behavior normal.        Thought Content: Thought content normal.        Judgment: Judgment normal.         Assessment And Plan:     1. Essential hypertension  Chronic  Blood pressure is much better  Continue with current medications  Will check kidney functions  - BMP8+eGFR  2. Stage 3 chronic kidney disease ([place]  Send results to Dr. UHollie Salk  3. Chronic gout of left knee, unspecified cause  Not taking uloric due to side effects he is concerned about.  -  Uric acid  4. Class 1 obesity due to excess calories with serious comorbidity and body mass index (BMI) of 33.0 to 33.9 in adult  Chronic  Discussed healthy diet and regular exercise options   Encouraged to exercise at least 150 minutes per week with 2 days of strength training.   Encouraged to use online platforms for exercising since gyms are closed  Minette Brine, FNP    THE PATIENT IS ENCOURAGED TO PRACTICE SOCIAL DISTANCING DUE TO THE COVID-19 PANDEMIC.

## 2018-07-10 LAB — BMP8+EGFR
BUN/Creatinine Ratio: 11 (ref 9–20)
BUN: 22 mg/dL (ref 6–24)
CO2: 23 mmol/L (ref 20–29)
Calcium: 9.5 mg/dL (ref 8.7–10.2)
Chloride: 104 mmol/L (ref 96–106)
Creatinine, Ser: 1.98 mg/dL — ABNORMAL HIGH (ref 0.76–1.27)
GFR calc Af Amer: 45 mL/min/{1.73_m2} — ABNORMAL LOW (ref >59)
GFR calc non Af Amer: 39 mL/min/{1.73_m2} — ABNORMAL LOW (ref >59)
Glucose: 88 mg/dL (ref 65–99)
Potassium: 4.5 mmol/L (ref 3.5–5.2)
Sodium: 140 mmol/L (ref 134–144)

## 2018-07-10 LAB — URIC ACID: Uric Acid: 10.1 mg/dL — ABNORMAL HIGH (ref 3.7–8.6)

## 2018-07-29 ENCOUNTER — Encounter: Payer: Self-pay | Admitting: Nurse Practitioner

## 2018-07-30 ENCOUNTER — Other Ambulatory Visit: Payer: Self-pay

## 2018-07-30 MED ORDER — COLCHICINE 0.6 MG PO TABS: 0.6000 mg | ORAL_TABLET | Freq: Every day | ORAL | 1 refills | Status: DC

## 2018-11-21 ENCOUNTER — Encounter: Payer: Self-pay | Admitting: Nurse Practitioner

## 2018-11-21 ENCOUNTER — Other Ambulatory Visit: Payer: Self-pay

## 2018-11-21 ENCOUNTER — Ambulatory Visit (INDEPENDENT_AMBULATORY_CARE_PROVIDER_SITE_OTHER): Payer: Managed Care, Other (non HMO) | Admitting: Nurse Practitioner

## 2018-11-21 VITALS — BP 128/78 | HR 65 | Temp 98.5°F | Ht 68.4 in | Wt 202.0 lb

## 2018-11-21 DIAGNOSIS — I10 Essential (primary) hypertension: Secondary | ICD-10-CM

## 2018-11-21 DIAGNOSIS — W19XXXA Unspecified fall, initial encounter: Secondary | ICD-10-CM | POA: Diagnosis not present

## 2018-11-21 DIAGNOSIS — S80211A Abrasion, right knee, initial encounter: Secondary | ICD-10-CM

## 2018-11-21 DIAGNOSIS — S80811A Abrasion, right lower leg, initial encounter: Secondary | ICD-10-CM

## 2018-11-21 HISTORY — DX: Unspecified fall, initial encounter: W19.XXXA

## 2018-11-21 NOTE — Progress Notes (Signed)
Subjective:     Patient ID: Chase Walsh , male    DOB: 1970-07-02 , 48 y.o.   MRN: [id number]   Chief Complaint  Patient presents with  . Fall    patient stated he felt real dehydrated this morning and he ended up falling patient stated when he woke up he had a cholly horse    HPI  Also had elevated blood pressure. He is doing one smoothie a day, has been fasting for 18 of 21 days. He continues to work in [employer]. Scraped left leg.  Feels like he has   Wt Readings from Last 3 Encounters: 11/21/18 : 202 lb (91.6 kg) 07/08/18 : 214 lb (97.1 kg) 06/05/18 : 213 lb (96.6 kg)  He has had 8 - 20 oz bottles of water today and grits.   Fall The accident occurred 6 to 12 hours ago. The fall occurred while walking. Pertinent negatives include no headaches or numbness. He has tried nothing for the symptoms.     Past Medical History:  Diagnosis Date  . Diverticulosis   . Gout   . Hypertension   . Vitiligo      Family History  Problem Relation Age of Onset  . Breast cancer Mother   . Arthritis Mother   . Cancer Paternal Uncle        maybe colon  . Inflammatory bowel disease Neg Hx      Current Outpatient Medications:  .  albuterol (PROVENTIL HFA;VENTOLIN HFA) 108 (90 Base) MCG/ACT inhaler, Inhale 2 puffs into the lungs every 6 (six) hours as needed for wheezing or shortness of breath., Disp: , Rfl:  .  amLODipine (NORVASC) 10 MG tablet, Take 1 tablet (10 mg total) by mouth daily., Disp: 90 tablet, Rfl: 1 .  colchicine 0.6 MG tablet, Take 1 tablet (0.6 mg total) by mouth daily., Disp: 90 tablet, Rfl: 1 .  fluocinonide (LIDEX) 0.05 % external solution, Apply 1 application topically as needed., Disp: , Rfl:  .  OLMESARTAN MEDOXOMIL PO, Take 1 tablet by mouth daily. Take 1 tablet 40mg  daily, Disp: , Rfl:    No Known Allergies   Review of Systems  Constitutional: Negative.  Negative for fatigue.  Respiratory: Negative.   Cardiovascular: Negative.  Negative for chest pain,  palpitations and leg swelling.  Neurological: Negative for dizziness, numbness and headaches.     Today's Vitals   11/21/18 1444  BP: 128/78  Pulse: 65  Temp: 98.5 F (36.9 C)  TempSrc: Oral  SpO2: 98%  Weight: 202 lb (91.6 kg)  Height: 5' 8.4" (1.737 m)  PainSc: 0-No pain   Body mass index is 30.36 kg/m.   Objective:  Physical Exam Constitutional:      Appearance: Normal appearance.  Cardiovascular:     Rate and Rhythm: Normal rate and regular rhythm.     Pulses: Normal pulses.     Heart sounds: Normal heart sounds. No murmur.  Pulmonary:     Effort: Pulmonary effort is normal. No respiratory distress.     Breath sounds: Normal breath sounds.  Musculoskeletal: Normal range of motion.  Skin:    General: Skin is warm and dry.     Capillary Refill: Capillary refill takes less than 2 seconds.     Comments: Abrasion to right medial leg near knee  Neurological:     General: No focal deficit present.     Mental Status: He is alert and oriented to person, place, and time.  Psychiatric:  Mood and Affect: Mood normal.        Behavior: Behavior normal.        Thought Content: Thought content normal.        Judgment: Judgment normal.         Assessment And Plan:     1. Essential hypertension Chronic, blood pressure is well controlled Encouraged to stay well hydrated  2. Fall, initial encounter  After getting out of bed early this morning  He did suffer an abrasion to right leg  3. Abrasion of right lower extremity, initial encounter Right medial leg near knee area has an abrasion  Advised to cleanse with antibacterial soap and water, apply neosporin     Minette Brine, FNP    THE PATIENT IS ENCOURAGED TO PRACTICE SOCIAL DISTANCING DUE TO THE COVID-19 PANDEMIC.

## 2018-12-15 ENCOUNTER — Encounter: Payer: Self-pay | Admitting: Nurse Practitioner

## 2018-12-16 ENCOUNTER — Other Ambulatory Visit: Payer: Self-pay

## 2018-12-16 ENCOUNTER — Encounter: Payer: Self-pay | Admitting: Nurse Practitioner

## 2018-12-16 ENCOUNTER — Telehealth (INDEPENDENT_AMBULATORY_CARE_PROVIDER_SITE_OTHER): Payer: PRIVATE HEALTH INSURANCE | Admitting: Nurse Practitioner

## 2018-12-16 VITALS — BP 136/96 | HR 73 | Wt 201.9 lb

## 2018-12-16 DIAGNOSIS — Z20828 Contact with and (suspected) exposure to other viral communicable diseases: Secondary | ICD-10-CM

## 2018-12-16 DIAGNOSIS — Z20822 Contact with and (suspected) exposure to covid-19: Secondary | ICD-10-CM

## 2018-12-16 NOTE — Progress Notes (Signed)
Virtual Visit via Video   This visit type was conducted due to national recommendations for restrictions regarding the COVID-19 Pandemic (e.g. social distancing) in an effort to limit this patient's exposure and mitigate transmission in our community.  Due to his co-morbid illnesses, this patient is at least at moderate risk for complications without adequate follow up.  This format is felt to be most appropriate for this patient at this time.  All issues noted in this document were discussed and addressed.  A limited physical exam was performed with this format.    This visit type was conducted due to national recommendations for restrictions regarding the COVID-19 Pandemic (e.g. social distancing) in an effort to limit this patient's exposure and mitigate transmission in our community.  Patients identity confirmed using two different identifiers.  This format is felt to be most appropriate for this patient at this time.  All issues noted in this document were discussed and addressed.  No physical exam was performed (except for noted visual exam findings with Video Visits).    Date:  12/16/2018   ID:  Chase Walsh, DOB 1970/06/06, MRN [id number]  Patient Location:  Home - spoke with Chase Walsh  Provider location:   Office    Chief Complaint:  Exposed to coronavirus  History of Present Illness:    Chase Walsh is a 49 y.o. male who presents via video conferencing for a telehealth visit today.    The patient does not have symptoms concerning for COVID-19 infection (fever, chills, cough, or new shortness of breath).   Visit He was with a friend the weekend of Dec 5th who tested positive after being exposed to another friend.  No current symptoms.  He found out Monday.  He has not been tested at this time.  He has not been to work will go back on December 19th according to his employer.      Past Medical History:  Diagnosis Date  . Diverticulosis   . Gout   . Hypertension   .  Vitiligo    Past Surgical History:  Procedure Laterality Date  . COLONOSCOPY N/A 04/22/2016   Procedure: COLONOSCOPY;  Surgeon: Doran Stabler, MD;  Location[facility] ENDOSCOPY;  Service: Endoscopy;  Laterality: N/A;  . HERNIA REPAIR       Current Meds  Medication Sig  . albuterol (PROVENTIL HFA;VENTOLIN HFA) 108 (90 Base) MCG/ACT inhaler Inhale 2 puffs into the lungs every 6 (six) hours as needed for wheezing or shortness of breath.  . allopurinol (ZYLOPRIM) 100 MG tablet Take 100 mg by mouth 2 (two) times daily.   Marland Kitchen amLODipine (NORVASC) 10 MG tablet Take 1 tablet (10 mg total) by mouth daily.  . colchicine 0.6 MG tablet Take 1 tablet (0.6 mg total) by mouth daily.  . fluocinonide (LIDEX) 0.05 % external solution Apply 1 application topically as needed.  Marland Kitchen olmesartan (BENICAR) 40 MG tablet Take 1 tablet by mouth daily.      Allergies:   Patient has no known allergies.   Social History   Tobacco Use  . Smoking status: Never Smoker  . Smokeless tobacco: Never Used  Substance Use Topics  . Alcohol use: No  . Drug use: No     Family Hx: The patient's family history includes Arthritis in his mother; Breast cancer in his mother; Cancer in his paternal uncle. There is no history of Inflammatory bowel disease.  ROS:   Please see the history of present illness.    Review  of Systems  Constitutional: Negative.   Respiratory: Negative.   Cardiovascular: Negative.   Neurological: Negative.  Negative for dizziness and tingling.  Psychiatric/Behavioral: Negative.     All other systems reviewed and are negative.   Labs/Other Tests and Data Reviewed:    Recent Labs: 12/17/2017: ALT 27; Hemoglobin 13.7; Platelets 256 07/08/2018: BUN 22; Creatinine, Ser 1.98; Potassium 4.5; Sodium 140   Recent Lipid Panel Lab Results  Component Value Date/Time   CHOL 229 (H) 12/17/2017 10:50 AM   TRIG 184 (H) 12/17/2017 10:50 AM   HDL 33 (L) 12/17/2017 10:50 AM   CHOLHDL 6.9 (H) 12/17/2017 10:50 AM    LDLCALC 159 (H) 12/17/2017 10:50 AM    Wt Readings from Last 3 Encounters:  12/16/18 201 lb 15.1 oz (91.6 kg)  11/21/18 202 lb (91.6 kg)  07/08/18 214 lb (97.1 kg)     Exam:    Vital Signs:  BP (!) 136/96 (BP Location: Left Arm, Patient Position: Sitting, Cuff Size: Small)   Pulse 73   Wt 201 lb 15.1 oz (91.6 kg)   BMI 30.35 kg/m     Physical Exam  Constitutional: He is oriented to person, place, and time and well-developed, well-nourished, and in no distress. No distress.  Pulmonary/Chest: Effort normal. No respiratory distress.  Neurological: He is alert and oriented to person, place, and time.  Psychiatric: Mood, memory, affect and judgment normal.    ASSESSMENT & PLAN:    1. Exposure to COVID-19 virus  Will have him to go be tested at [facility]  He is to remain self - isolated until December 19 which will be right at his 14 days of initial contact.    COVID-19 Education: The signs and symptoms of COVID-19 were discussed with the patient and how to seek care for testing (follow up with PCP or arrange E-visit).  The importance of social distancing was discussed today.  Patient Risk:   After full review of this patients clinical status, I feel that they are at least moderate risk at this time.  Time:   Today, I have spent 7 minutes/ seconds with the patient with telehealth technology discussing above diagnoses.     Medication Adjustments/Labs and Tests Ordered: Current medicines are reviewed at length with the patient today.  Concerns regarding medicines are outlined above.   Tests Ordered: Orders Placed This Encounter  Procedures  . Novel Coronavirus, NAA ([facility])    Medication Changes: No orders of the defined types were placed in this encounter.   Disposition:  Follow up prn  Signed, [name], FNP

## 2018-12-16 NOTE — Patient Instructions (Signed)
COVID-19 Frequently Asked Questions °COVID-19 (coronavirus disease) is an infection that is caused by a large family of viruses. Some viruses cause illness in people and others cause illness in animals like camels, cats, and bats. In some cases, the viruses that cause illness in animals can spread to humans. °Where did the coronavirus come from? °In December 2019, [place] told the World Health Organization (WHO) of several cases of lung disease (human respiratory illness). These cases were linked to an open seafood and livestock market in the city of [place]. The link to the seafood and livestock market suggests that the virus may have spread from animals to humans. However, since that first outbreak in December, the virus has also been shown to spread from person to person. °What is the name of the disease and the virus? °Disease name °Early on, this disease was called novel coronavirus. This is because scientists determined that the disease was caused by a new (novel) respiratory virus. The World Health Organization (WHO) has now named the disease COVID-19, or coronavirus disease. °Virus name °The virus that causes the disease is called severe acute respiratory syndrome coronavirus 2 (SARS-CoV-2). °More information on disease and virus naming °World Health Organization (WHO): www.who.int/emergencies/diseases/novel-coronavirus-2019/technical-guidance/naming-the-coronavirus-disease-(covid-2019)-and-the-virus-that-causes-it °Who is at risk for complications from coronavirus disease? °Some people may be at higher risk for complications from coronavirus disease. This includes older adults and people who have chronic diseases, such as heart disease, diabetes, and lung disease. °If you are at higher risk for complications, take these extra precautions: °· Avoid close contact with people who are sick or have a fever or cough. Stay at least 3-6 ft (1-2 m) away from them, if possible. °· Wash your hands often with soap and  water for at least 20 seconds. °· Avoid touching your face, mouth, nose, or eyes. °· Keep supplies on hand at home, such as food, medicine, and cleaning supplies. °· Stay home as much as possible. °· Avoid social gatherings and travel. °How does coronavirus disease spread? °The virus that causes coronavirus disease spreads easily from person to person (is contagious). There are also cases of community-spread disease. This means the disease has spread to: °· People who have no known contact with other infected people. °· People who have not traveled to areas where there are known cases. °It appears to spread from one person to another through droplets from coughing or sneezing. °Can I get the virus from touching surfaces or objects? °There is still a lot that we do not know about the virus that causes coronavirus disease. Scientists are basing a lot of information on what they know about similar viruses, such as: °· Viruses cannot generally survive on surfaces for long. They need a human body (host) to survive. °· It is more likely that the virus is spread by close contact with people who are sick (direct contact), such as through: °? Shaking hands or hugging. °? Breathing in respiratory droplets that travel through the air. This can happen when an infected person coughs or sneezes on or near other people. °· It is less likely that the virus is spread when a person touches a surface or object that has the virus on it (indirect contact). The virus may be able to enter the body if the person touches a surface or object and then touches his or her face, eyes, nose, or mouth. °Can a person spread the virus without having symptoms of the disease? °It may be possible for the virus to spread before a person   has symptoms of the disease, but this is most likely not the main way the virus is spreading. It is more likely for the virus to spread by being in close contact with people who are sick and breathing in the respiratory  droplets of a sick person's cough or sneeze. °What are the symptoms of coronavirus disease? °Symptoms vary from person to person and can range from mild to severe. Symptoms may include: °· Fever. °· Cough. °· Tiredness, weakness, or fatigue. °· Fast breathing or feeling short of breath. °These symptoms can appear anywhere from 2 to 14 days after you have been exposed to the virus. If you develop symptoms, call your health care provider. People with severe symptoms may need hospital care. °If I am exposed to the virus, how long does it take before symptoms start? °Symptoms of coronavirus disease may appear anywhere from 2 to 14 days after a person has been exposed to the virus. If you develop symptoms, call your health care provider. °Should I be tested for this virus? °Your health care provider will decide whether to test you based on your symptoms, history of exposure, and your risk factors. °How does a health care provider test for this virus? °Health care providers will collect samples to send for testing. Samples may include: °· Taking a swab of fluid from the nose. °· Taking fluid from the lungs by having you cough up mucus (sputum) into a sterile cup. °· Taking a blood sample. °· Taking a stool or urine sample. °Is there a treatment or vaccine for this virus? °Currently, there is no vaccine to prevent coronavirus disease. Also, there are no medicines like antibiotics or antivirals to treat the virus. A person who becomes sick is given supportive care, which means rest and fluids. A person may also relieve his or her symptoms by using over-the-counter medicines that treat sneezing, coughing, and runny nose. These are the same medicines that a person takes for the common cold. °If you develop symptoms, call your health care provider. People with severe symptoms may need hospital care. °What can I do to protect myself and my family from this virus? ° °  ° °You can protect yourself and your family by taking the  same actions that you would take to prevent the spread of other viruses. Take the following actions: °· Wash your hands often with soap and water for at least 20 seconds. If soap and water are not available, use alcohol-based hand sanitizer. °· Avoid touching your face, mouth, nose, or eyes. °· Cough or sneeze into a tissue, sleeve, or elbow. Do not cough or sneeze into your hand or the air. °? If you cough or sneeze into a tissue, throw it away immediately and wash your hands. °· Disinfect objects and surfaces that you frequently touch every day. °· Avoid close contact with people who are sick or have a fever or cough. Stay at least 3-6 ft (1-2 m) away from them, if possible. °· Stay home if you are sick, except to get medical care. Call your health care provider before you get medical care. °· Make sure your vaccines are up to date. Ask your health care provider what vaccines you need. °What should I do if I need to travel? °Follow travel recommendations from your local health authority, the CDC, and WHO. °Travel information and advice °· Centers for Disease Control and Prevention (CDC): www.cdc.gov/coronavirus/2019-ncov/travelers/index.html °· World Health Organization (WHO): www.who.int/emergencies/diseases/novel-coronavirus-2019/travel-advice °Know the risks and take action to protect your health °·   You are at higher risk of getting coronavirus disease if you are traveling to areas with an outbreak or if you are exposed to travelers from areas with an outbreak. °· Wash your hands often and practice good hygiene to lower the risk of catching or spreading the virus. °What should I do if I am sick? °General instructions to stop the spread of infection °· Wash your hands often with soap and water for at least 20 seconds. If soap and water are not available, use alcohol-based hand sanitizer. °· Cough or sneeze into a tissue, sleeve, or elbow. Do not cough or sneeze into your hand or the air. °· If you cough or  sneeze into a tissue, throw it away immediately and wash your hands. °· Stay home unless you must get medical care. Call your health care provider or local health authority before you get medical care. °· Avoid public areas. Do not take public transportation, if possible. °· If you can, wear a mask if you must go out of the house or if you are in close contact with someone who is not sick. °Keep your home clean °· Disinfect objects and surfaces that are frequently touched every day. This may include: °? Counters and tables. °? Doorknobs and light switches. °? Sinks and faucets. °? Electronics such as phones, remote controls, keyboards, computers, and tablets. °· Wash dishes in hot, soapy water or use a dishwasher. Air-dry your dishes. °· Wash laundry in hot water. °Prevent infecting other household members °· Let healthy household members care for children and pets, if possible. If you have to care for children or pets, wash your hands often and wear a mask. °· Sleep in a different bedroom or bed, if possible. °· Do not share personal items, such as razors, toothbrushes, deodorant, combs, brushes, towels, and washcloths. °Where to find more information °Centers for Disease Control and Prevention (CDC) °· Information and news updates: www.cdc.gov/coronavirus/2019-ncov °World Health Organization (WHO) °· Information and news updates: www.who.int/emergencies/diseases/novel-coronavirus-2019 °· Coronavirus health topic: www.who.int/health-topics/coronavirus °· Questions and answers on COVID-19: www.who.int/news-room/q-a-detail/q-a-coronaviruses °· Global tracker: who.sprinklr.com °American Academy of Pediatrics (AAP) °· Information for families: www.healthychildren.org/English/health-issues/conditions/chest-lungs/Pages/2019-Novel-Coronavirus.aspx °The coronavirus situation is changing rapidly. Check your local health authority website or the CDC and WHO websites for updates and news. °When should I contact a health care  provider? °· Contact your health care provider if you have symptoms of an infection, such as fever or cough, and you: °? Have been near anyone who is known to have coronavirus disease. °? Have come into contact with a person who is suspected to have coronavirus disease. °? Have traveled outside of the country. °When should I get emergency medical care? °· Get help right away by calling your local emergency services (911 in the [place] if you have: °? Trouble breathing. °? Pain or pressure in your chest. °? Confusion. °? Blue-tinged lips and fingernails. °? Difficulty waking from sleep. °? Symptoms that get worse. °Let the emergency medical personnel know if you think you have coronavirus disease. °Summary °· A new respiratory virus is spreading from person to person and causing COVID-19 (coronavirus disease). °· The virus that causes COVID-19 appears to spread easily. It spreads from one person to another through droplets from coughing or sneezing. °· Older adults and those with chronic diseases are at higher risk of disease. If you are at higher risk for complications, take extra precautions. °· There is currently no vaccine to prevent coronavirus disease. There are no medicines, such as antibiotics or   antivirals, to treat the virus. °· You can protect yourself and your family by washing your hands often, avoiding touching your face, and covering your coughs and sneezes. °This information is not intended to replace advice given to you by your health care provider. Make sure you discuss any questions you have with your health care provider. °Document Released: 04/16/2018 Document Revised: 04/16/2018 Document Reviewed: 04/16/2018 °Elsevier Patient Education © 2020 [employer] ° °

## 2018-12-17 LAB — NOVEL CORONAVIRUS, NAA: SARS-CoV-2, NAA: DETECTED — AB

## 2018-12-19 ENCOUNTER — Telehealth: Payer: Self-pay | Admitting: Nurse Practitioner

## 2018-12-19 ENCOUNTER — Encounter: Payer: Managed Care, Other (non HMO) | Admitting: Nurse Practitioner

## 2018-12-19 NOTE — Telephone Encounter (Signed)
Called to Discuss with patient about Covid symptoms and the use of bamlanivimab, a monoclonal antibody infusion for those with mild to moderate Covid symptoms and at a high risk of hospitalization.     Pt is qualified for this infusion at the [facility] infusion center due to co-morbid conditions and/or a member of an at-risk group.     Patient Active Problem List   Diagnosis Date Noted  . Fall 11/21/2018  . Stage 3 chronic kidney disease 07/08/2018  . Class 1 obesity due to excess calories with serious comorbidity and body mass index (BMI) of 33.0 to 33.9 in adult 07/08/2018  . Exposure to COVID-19 virus 06/05/2018  . Chronic gout of left knee 03/18/2018  . Rectal bleeding 04/22/2016  . Diverticulosis of colon with hemorrhage   . Hematochezia 04/21/2016  . Vitiligo   . Essential hypertension   . Lower GI bleed   . Acute blood loss anemia     Patient declines infusion at this time. Symptoms tier reviewed as well as criteria for ending isolation. Preventative practices reviewed. Patient verbalized understanding.    Patient advised to call back if he decides that he does want to get infusion. Callback number to the infusion center given. Patient advised to go to Urgent care or ED with severe symptoms.

## 2018-12-25 ENCOUNTER — Encounter: Payer: Managed Care, Other (non HMO) | Admitting: Nurse Practitioner

## 2018-12-31 ENCOUNTER — Telehealth: Payer: Self-pay

## 2018-12-31 ENCOUNTER — Other Ambulatory Visit: Payer: Self-pay | Admitting: Nurse Practitioner

## 2018-12-31 ENCOUNTER — Encounter: Payer: Self-pay | Admitting: Nurse Practitioner

## 2018-12-31 DIAGNOSIS — B342 Coronavirus infection, unspecified: Secondary | ICD-10-CM

## 2018-12-31 DIAGNOSIS — R0789 Other chest pain: Secondary | ICD-10-CM

## 2018-12-31 NOTE — Telephone Encounter (Signed)
I called patient to notify him that he is going to have to go to the ER to get a chest xray done I spoke with [facility] and radiology at the hospital and both stated they are not seeing positive covid patients they need to go to ER. YRL,RMA

## 2019-01-06 ENCOUNTER — Encounter: Payer: Self-pay | Admitting: Nurse Practitioner

## 2019-01-07 NOTE — Telephone Encounter (Signed)
Please write a work note to return to work tomorrow 01/08/2019 with CDC recommendations of no symptoms x 3 days.

## 2019-01-08 ENCOUNTER — Encounter: Payer: Self-pay | Admitting: Nurse Practitioner

## 2019-01-08 ENCOUNTER — Ambulatory Visit: Payer: Managed Care, Other (non HMO) | Attending: Internal Medicine

## 2019-01-08 DIAGNOSIS — Z20822 Contact with and (suspected) exposure to covid-19: Secondary | ICD-10-CM

## 2019-01-09 LAB — NOVEL CORONAVIRUS, NAA: SARS-CoV-2, NAA: NOT DETECTED

## 2019-01-10 ENCOUNTER — Encounter: Payer: Self-pay | Admitting: Nurse Practitioner

## 2019-01-10 NOTE — Telephone Encounter (Signed)
Yes make an appt 

## 2019-01-14 ENCOUNTER — Telehealth: Payer: Self-pay

## 2019-01-14 NOTE — Telephone Encounter (Signed)
I left the pt a message that I was calling him to schedule him an appointment with Ms. Christell Constant, NP.

## 2019-01-23 ENCOUNTER — Other Ambulatory Visit: Payer: Self-pay

## 2019-01-23 ENCOUNTER — Emergency Department (HOSPITAL_COMMUNITY)
Admission: EM | Admit: 2019-01-23 | Discharge: 2019-01-24 | Disposition: A | Discharge status: Home / Self Care | Payer: PRIVATE HEALTH INSURANCE | Attending: Emergency Medicine | Admitting: Emergency Medicine

## 2019-01-23 ENCOUNTER — Encounter (HOSPITAL_COMMUNITY): Payer: Self-pay | Admitting: Emergency Medicine

## 2019-01-23 DIAGNOSIS — N183 Chronic kidney disease, stage 3 unspecified: Secondary | ICD-10-CM | POA: Diagnosis not present

## 2019-01-23 DIAGNOSIS — I129 Hypertensive chronic kidney disease with stage 1 through stage 4 chronic kidney disease, or unspecified chronic kidney disease: Secondary | ICD-10-CM | POA: Diagnosis not present

## 2019-01-23 DIAGNOSIS — K529 Noninfective gastroenteritis and colitis, unspecified: Secondary | ICD-10-CM

## 2019-01-23 DIAGNOSIS — R1013 Epigastric pain: Secondary | ICD-10-CM | POA: Diagnosis present

## 2019-01-23 DIAGNOSIS — Z79899 Other long term (current) drug therapy: Secondary | ICD-10-CM | POA: Insufficient documentation

## 2019-01-23 LAB — COMPREHENSIVE METABOLIC PANEL
ALT: 35 U/L (ref 0–44)
AST: 38 U/L (ref 15–41)
Albumin: 4.1 g/dL (ref 3.5–5.0)
Alkaline Phosphatase: 78 U/L (ref 38–126)
Anion gap: 11 (ref 5–15)
BUN: 7 mg/dL (ref 6–20)
CO2: 24 mmol/L (ref 22–32)
Calcium: 9.7 mg/dL (ref 8.9–10.3)
Chloride: 105 mmol/L (ref 98–111)
Creatinine, Ser: 2.02 mg/dL — ABNORMAL HIGH (ref 0.61–1.24)
GFR calc Af Amer: 44 mL/min — ABNORMAL LOW (ref >60)
GFR calc non Af Amer: 38 mL/min — ABNORMAL LOW (ref >60)
Glucose, Bld: 113 mg/dL — ABNORMAL HIGH (ref 70–99)
Potassium: 4.7 mmol/L (ref 3.5–5.1)
Sodium: 140 mmol/L (ref 135–145)
Total Bilirubin: 0.8 mg/dL (ref 0.3–1.2)
Total Protein: 7.4 g/dL (ref 6.5–8.1)

## 2019-01-23 LAB — URINALYSIS, ROUTINE W REFLEX MICROSCOPIC
Bacteria, UA: NONE SEEN
Bilirubin Urine: NEGATIVE
Glucose, UA: NEGATIVE mg/dL
Hgb urine dipstick: NEGATIVE
Ketones, ur: NEGATIVE mg/dL
Leukocytes,Ua: NEGATIVE
Nitrite: NEGATIVE
Protein, ur: 100 mg/dL — AB
Specific Gravity, Urine: 1.01 (ref 1.005–1.030)
pH: 9 — ABNORMAL HIGH (ref 5.0–8.0)

## 2019-01-23 LAB — CBC
HCT: 41.8 % (ref 39.0–52.0)
Hemoglobin: 13 g/dL (ref 13.0–17.0)
MCH: 28.4 pg (ref 26.0–34.0)
MCHC: 31.1 g/dL (ref 30.0–36.0)
MCV: 91.5 fL (ref 80.0–100.0)
Platelets: 237 10*3/uL (ref 150–400)
RBC: 4.57 MIL/uL (ref 4.22–5.81)
RDW: 12.5 % (ref 11.5–15.5)
WBC: 9.3 10*3/uL (ref 4.0–10.5)
nRBC: 0 % (ref 0.0–0.2)

## 2019-01-23 LAB — LIPASE, BLOOD: Lipase: 37 U/L (ref 11–51)

## 2019-01-23 MED ORDER — SODIUM CHLORIDE 0.9% FLUSH: 3.0000 mL | Freq: Once | INTRAVENOUS | Status: DC

## 2019-01-23 NOTE — ED Triage Notes (Signed)
Patient reports mid abdominal pain with emesis onset today , denies diarrhea or fever .

## 2019-01-24 ENCOUNTER — Emergency Department (HOSPITAL_COMMUNITY): Payer: PRIVATE HEALTH INSURANCE

## 2019-01-24 DIAGNOSIS — K529 Noninfective gastroenteritis and colitis, unspecified: Secondary | ICD-10-CM | POA: Diagnosis not present

## 2019-01-24 MED ORDER — LIDOCAINE VISCOUS HCL 2 % MT SOLN
15.0000 mL | Freq: Once | OROMUCOSAL | Status: AC
  Administered 2019-01-24: 15 mL via ORAL
  Filled 2019-01-24: qty 15

## 2019-01-24 MED ORDER — ALUM & MAG HYDROXIDE-SIMETH 200-200-20 MG/5ML PO SUSP
30.0000 mL | Freq: Once | ORAL | Status: AC
  Administered 2019-01-24: 30 mL via ORAL
  Filled 2019-01-24: qty 30

## 2019-01-24 MED ORDER — ONDANSETRON HCL 8 MG PO TABS: 8.0000 mg | ORAL_TABLET | Freq: Three times a day (TID) | ORAL | 0 refills | Status: DC | PRN

## 2019-01-24 NOTE — ED Provider Notes (Signed)
[facility] EMERGENCY DEPARTMENT Provider Note   CSN: [id number] Arrival date & time: 01/23/19  1937     History Chief Complaint  Patient presents with  . Abdominal Pain    Chase Walsh is a 49 y.o. male.  Patient is a 49 year old male with past medical history of chronic renal insufficiency, hypertension, prior diverticulitis.  He presents today for evaluation of abdominal pain, nausea, and vomiting.  This began earlier this afternoon while at work.  Patient describes a burning sensation to the upper abdomen followed by multiple episodes of nausea and vomiting, all of which were nonbilious and nonbloody.  He denies any diarrhea or constipation.  He denies any fevers or chills.  He denies any ill contacts or having consumed any suspicious foods.  The history is provided by the patient.  Abdominal Pain Pain location:  Epigastric Pain quality: burning   Pain radiates to:  Does not radiate Pain severity:  Moderate Onset quality:  Sudden Duration:  8 hours Timing:  Constant Progression:  Improving Chronicity:  New Relieved by:  Nothing Worsened by:  Nothing Ineffective treatments:  None tried      Past Medical History:  Diagnosis Date  . Diverticulosis   . Gout   . Hypertension   . Vitiligo     Patient Active Problem List   Diagnosis Date Noted  . Fall 11/21/2018  . Stage 3 chronic kidney disease 07/08/2018  . Class 1 obesity due to excess calories with serious comorbidity and body mass index (BMI) of 33.0 to 33.9 in adult 07/08/2018  . Exposure to COVID-19 virus 06/05/2018  . Chronic gout of left knee 03/18/2018  . Rectal bleeding 04/22/2016  . Diverticulosis of colon with hemorrhage   . Hematochezia 04/21/2016  . Vitiligo   . Essential hypertension   . Lower GI bleed   . Acute blood loss anemia     Past Surgical History:  Procedure Laterality Date  . COLONOSCOPY N/A 04/22/2016   Procedure: COLONOSCOPY;  Surgeon: Sherrilyn Rist, MD;   Location: [facility] ENDOSCOPY;  Service: Endoscopy;  Laterality: N/A;  . HERNIA REPAIR         Family History  Problem Relation Age of Onset  . Breast cancer Mother   . Arthritis Mother   . Cancer Paternal Uncle        maybe colon  . Inflammatory bowel disease Neg Hx     Social History   Tobacco Use  . Smoking status: Never Smoker  . Smokeless tobacco: Never Used  Substance Use Topics  . Alcohol use: No  . Drug use: No    Home Medications Prior to Admission medications   Medication Sig Start Date End Date Taking? Authorizing Provider  albuterol (PROVENTIL HFA;VENTOLIN HFA) 108 (90 Base) MCG/ACT inhaler Inhale 2 puffs into the lungs every 6 (six) hours as needed for wheezing or shortness of breath.    [provider]  allopurinol (ZYLOPRIM) 100 MG tablet Take 100 mg by mouth 2 (two) times daily.     [provider]  amLODipine (NORVASC) 10 MG tablet Take 1 tablet (10 mg total) by mouth daily. 07/08/18   [name], FNP  colchicine 0.6 MG tablet Take 1 tablet (0.6 mg total) by mouth daily. 07/30/18   [name], FNP  fluocinonide (LIDEX) 0.05 % external solution Apply 1 application topically as needed.    [provider]  olmesartan (BENICAR) 40 MG tablet Take 1 tablet by mouth daily.  [provider]    Allergies    Patient has no known allergies.  Review of Systems   Review of Systems  Gastrointestinal: Positive for abdominal pain.  All other systems reviewed and are negative.   Physical Exam Updated Vital Signs BP (!) 145/104 (BP Location: Left Arm)   Pulse 74   Temp 98.2 F (36.8 C) (Oral)   Resp 16   Ht 5\' 9"  (1.753 m)   Wt 84.8 kg   SpO2 100%   BMI 27.62 kg/m   Physical Exam Vitals and nursing note reviewed.  Constitutional:      General: He is not in acute distress.    Appearance: He is well-developed. He is not diaphoretic.  HENT:     Head: Normocephalic and atraumatic.  Cardiovascular:     Rate and Rhythm:  Normal rate and regular rhythm.     Heart sounds: No murmur. No friction rub.  Pulmonary:     Effort: Pulmonary effort is normal. No respiratory distress.     Breath sounds: Normal breath sounds. No wheezing or rales.  Abdominal:     General: Bowel sounds are normal. There is no distension.     Palpations: Abdomen is soft.     Tenderness: There is abdominal tenderness in the epigastric area. There is no right CVA tenderness, left CVA tenderness, guarding or rebound.  Musculoskeletal:        General: Normal range of motion.     Cervical back: Normal range of motion and neck supple.  Skin:    General: Skin is warm and dry.  Neurological:     Mental Status: He is alert and oriented to person, place, and time.     Coordination: Coordination normal.     ED Results / Procedures / Treatments   Labs (all labs ordered are listed, but only abnormal results are displayed) Labs Reviewed  COMPREHENSIVE METABOLIC PANEL - Abnormal; Notable for the following components:      Result Value   Glucose, Bld 113 (*)    Creatinine, Ser 2.02 (*)    GFR calc non Af Amer 38 (*)    GFR calc Af Amer 44 (*)    All other components within normal limits  URINALYSIS, ROUTINE W REFLEX MICROSCOPIC - Abnormal; Notable for the following components:   pH 9.0 (*)    Protein, ur 100 (*)    All other components within normal limits  LIPASE, BLOOD  CBC    EKG None  Radiology No results found.  Procedures Procedures (including critical care time)  Medications Ordered in ED Medications  sodium chloride flush (NS) 0.9 % injection 3 mL (has no administration in time range)  alum & mag hydroxide-simeth (MAALOX/MYLANTA) 200-200-20 MG/5ML suspension 30 mL (has no administration in time range)    And  lidocaine (XYLOCAINE) 2 % viscous mouth solution 15 mL (has no administration in time range)    ED Course  I have reviewed the triage vital signs and the nursing notes.  Pertinent labs & imaging results that  were available during my care of the patient were reviewed by me and considered in my medical decision making (see chart for details).    MDM Rules/Calculators/A&P  Patient presenting here with complaints of epigastric pain, nausea, and vomiting that started today while at work.  Patient is tender in the epigastrium, but there are no peritoneal signs.  His work-up shows no evidence for leukocytosis.  He does have mild elevation of his creatinine, however this  appears baseline.  His urinalysis is clear and CT scan shows only enteritis, but no acute surgical pathology.  At this point, I feel as though patient is appropriate for discharge.  He will be given Zofran and advised to adhere to a clear liquid diet for the next 12 hours and advance to normal as tolerated.  He is to return if symptoms worsen or change.  Final Clinical Impression(s) / ED Diagnoses Final diagnoses:  None    Rx / DC Orders ED Discharge Orders    None       Geoffery Lyons, MD 01/24/19 [phone]

## 2019-01-24 NOTE — Discharge Instructions (Addendum)
Zofran as prescribed as needed for nausea.  Begin taking Prilosec once daily for the next 2 weeks.  Clear liquid diet for the next 12 hours, then slowly advance to normal as tolerated.  Return to the emergency department if you develop severe abdominal pain, high fever, bloody stool, or other new and concerning symptoms.

## 2019-02-04 ENCOUNTER — Other Ambulatory Visit: Payer: Self-pay

## 2019-02-04 ENCOUNTER — Encounter: Payer: Self-pay | Admitting: Nurse Practitioner

## 2019-02-04 ENCOUNTER — Ambulatory Visit (INDEPENDENT_AMBULATORY_CARE_PROVIDER_SITE_OTHER): Payer: PRIVATE HEALTH INSURANCE | Admitting: Nurse Practitioner

## 2019-02-04 VITALS — BP 116/80 | HR 68 | Temp 98.3°F | Ht 68.4 in | Wt 192.6 lb

## 2019-02-04 DIAGNOSIS — I10 Essential (primary) hypertension: Secondary | ICD-10-CM

## 2019-02-04 DIAGNOSIS — Z125 Encounter for screening for malignant neoplasm of prostate: Secondary | ICD-10-CM | POA: Diagnosis not present

## 2019-02-04 DIAGNOSIS — Z8616 Personal history of COVID-19: Secondary | ICD-10-CM

## 2019-02-04 DIAGNOSIS — Z1211 Encounter for screening for malignant neoplasm of colon: Secondary | ICD-10-CM | POA: Diagnosis not present

## 2019-02-04 DIAGNOSIS — N1831 Chronic kidney disease, stage 3a: Secondary | ICD-10-CM

## 2019-02-04 DIAGNOSIS — I129 Hypertensive chronic kidney disease with stage 1 through stage 4 chronic kidney disease, or unspecified chronic kidney disease: Secondary | ICD-10-CM

## 2019-02-04 DIAGNOSIS — Z0000 Encounter for general adult medical examination without abnormal findings: Secondary | ICD-10-CM | POA: Diagnosis not present

## 2019-02-04 DIAGNOSIS — R21 Rash and other nonspecific skin eruption: Secondary | ICD-10-CM

## 2019-02-04 DIAGNOSIS — M1A49X0 Other secondary chronic gout, multiple sites, without tophus (tophi): Secondary | ICD-10-CM

## 2019-02-04 MED ORDER — MOMETASONE FUROATE 0.1 % EX CREA: 1.0000 "application " | TOPICAL_CREAM | Freq: Every day | CUTANEOUS | 0 refills | Status: DC

## 2019-02-04 NOTE — Progress Notes (Addendum)
This visit occurred during the SARS-CoV-2 public health emergency.  Safety protocols were in place, including screening questions prior to the visit, additional usage of staff PPE, and extensive cleaning of exam room while observing appropriate contact time as indicated for disinfecting solutions.  Subjective:     Patient ID: Chase Walsh , male    DOB: 02/12/1970 , 49 y.o.   MRN: [id number]   Chief Complaint  Patient presents with  . Annual Exam    HPI  Here for HM  Wt Readings from Last 3 Encounters: 02/04/19 : 192 lb 9.6 oz (87.4 kg) 01/23/19 : 187 lb (84.8 kg) 12/16/18 : 201 lb 15.1 oz (91.6 kg)   Hypertension This is a chronic problem. The current episode started more than 1 year ago. The problem is controlled. Pertinent negatives include no anxiety or headaches. There are no associated agents to hypertension. Risk factors for coronary artery disease include sedentary lifestyle and male gender. Past treatments include calcium channel blockers. There are no compliance problems.  There is no history of angina. Identifiable causes of hypertension include chronic renal disease (being followed by nephrology).    Men's preventive visit. Patient Health Questionnaire (PHQ-2) is    Office Visit from 02/04/2019 in [facility]  PHQ-2 Total Score  0     Patient is on a low salt diet and no pork, cutting back on beef. Exercising - no exercising just working.  Marital status: Married. Relevant history for alcohol use is:  Social History   Substance and Sexual Activity  Alcohol Use No   Relevant history for tobacco use is:  Social History   Tobacco Use  Smoking Status Never Smoker  Smokeless Tobacco Never Used   Past Medical History:  Diagnosis Date  . Diverticulosis   . Gout   . Hypertension   . Vitiligo      Family History  Problem Relation Age of Onset  . Breast cancer Mother   . Arthritis Mother   . Cancer Paternal Uncle        maybe colon  .  Inflammatory bowel disease Neg Hx      Current Outpatient Medications:  .  allopurinol (ZYLOPRIM) 100 MG tablet, Take 100 mg by mouth 2 (two) times daily. , Disp: , Rfl:  .  amLODipine (NORVASC) 10 MG tablet, Take 1 tablet (10 mg total) by mouth daily., Disp: 90 tablet, Rfl: 1 .  colchicine 0.6 MG tablet, Take 1 tablet (0.6 mg total) by mouth daily., Disp: 90 tablet, Rfl: 1 .  fluocinonide (LIDEX) 0.05 % external solution, Apply 1 application topically as needed., Disp: , Rfl:  .  olmesartan (BENICAR) 40 MG tablet, Take 1 tablet by mouth daily. , Disp: , Rfl:  .  ondansetron (ZOFRAN) 8 MG tablet, Take 1 tablet (8 mg total) by mouth every 8 (eight) hours as needed for nausea., Disp: 6 tablet, Rfl: 0 .  albuterol (PROVENTIL HFA;VENTOLIN HFA) 108 (90 Base) MCG/ACT inhaler, Inhale 2 puffs into the lungs every 6 (six) hours as needed for wheezing or shortness of breath., Disp: , Rfl:    No Known Allergies   Review of Systems  Constitutional: Negative.   HENT: Negative.   Eyes: Negative.   Respiratory: Negative.   Cardiovascular: Negative.   Endocrine: Negative.   Genitourinary: Negative.   Musculoskeletal: Negative.   Skin: Negative.   Neurological: Negative.  Negative for dizziness and headaches.  Hematological: Negative.   Psychiatric/Behavioral: Negative.      Today's Vitals  02/04/19 1541  BP: 116/80  Pulse: 68  Temp: 98.3 F (36.8 C)  TempSrc: Oral  Weight: 192 lb 9.6 oz (87.4 kg)  Height: 5' 8.4" (1.737 m)  PainSc: 0-No pain   Body mass index is 28.94 kg/m.   Objective:  Physical Exam Vitals reviewed.  Constitutional:      General: He is not in acute distress.    Appearance: Normal appearance. He is obese.  HENT:     Head: Normocephalic and atraumatic.     Right Ear: Tympanic membrane, ear canal and external ear normal. There is no impacted cerumen.     Left Ear: Tympanic membrane, ear canal and external ear normal. There is no impacted cerumen.  Eyes:      Extraocular Movements: Extraocular movements intact.     Conjunctiva/sclera: Conjunctivae normal.     Pupils: Pupils are equal, round, and reactive to light.  Cardiovascular:     Rate and Rhythm: Normal rate and regular rhythm.     Pulses: Normal pulses.     Heart sounds: Normal heart sounds. No murmur.  Pulmonary:     Effort: Pulmonary effort is normal. No respiratory distress.     Breath sounds: Normal breath sounds.  Abdominal:     General: Abdomen is flat. Bowel sounds are normal. There is no distension.     Palpations: Abdomen is soft.  Genitourinary:    Prostate: Normal.     Rectum: Guaiac result negative (negative).  Musculoskeletal:        General: Normal range of motion.     Cervical back: Normal range of motion and neck supple.  Skin:    General: Skin is warm.     Capillary Refill: Capillary refill takes less than 2 seconds.     Findings: Rash (dry skin to posterior upper back) present.     Comments: Dry rash to back area upper more than lower  Neurological:     General: No focal deficit present.     Mental Status: He is alert and oriented to person, place, and time.     Cranial Nerves: No cranial nerve deficit.  Psychiatric:        Mood and Affect: Mood normal.        Behavior: Behavior normal.        Thought Content: Thought content normal.        Judgment: Judgment normal.         Assessment And Plan:     1. Routine general medical examination at a health care facility . Behavior modifications discussed and diet history reviewed.   . Pt will continue to exercise regularly and modify diet with low GI, plant based foods and decrease intake of processed foods.  . Recommend intake of daily multivitamin, Vitamin D, and calcium.  . Recommend colonoscopy (had in 2018 however recommended to have at age 68 as screening due to poor preparation of colon) for preventive screenings, as well as recommend immunizations that include influenza, TDAP - Lipid panel - CBC  2.  Encounter for screening colonoscopy  According to USPTF Colorectal cancer Screening guidelines. Colonoscopy is recommended every 10 years, starting at age 85years.  Will refer to GI for colon cancer screening. - Ambulatory referral to Gastroenterology  3. Encounter for prostate cancer screening  Prostate normal with manual check - PSA  4. History of COVID-19  Overall doing well  5. Essential hypertension  Chronic, much better control  Continue with current medications  EKG done with NSR -  POCT Urinalysis Dipstick (81002) - POCT UA - Microalbumin - EKG 12-Lead  6. Stage 3a chronic kidney disease  Continue with follow up with Nephrology  Will send copy of labs to Dr. Hollie Salk - CMP14+EGFR  7. Other secondary chronic gout of multiple sites without tophus  He had an exacerbation when he had covid  Will check uric acid - Uric acid  8. Rash and nonspecific skin eruption  Dry skin to upper back likely eczema will treat with mometasone cream - mometasone (ELOCON) 0.1 % cream; Apply 1 application topically daily.  Dispense: 45 g; Refill: 0   Minette Brine, FNP    THE PATIENT IS ENCOURAGED TO PRACTICE SOCIAL DISTANCING DUE TO THE COVID-19 PANDEMIC.

## 2019-02-04 NOTE — Patient Instructions (Signed)
Health Maintenance  Topic Date Due  . INFLUENZA VACCINE  04/02/2019 (Originally 08/03/2018)  . TETANUS/TDAP  05/19/2025  . COLONOSCOPY  04/23/2026  . HIV Screening  Completed   Health Maintenance, Male Adopting a healthy lifestyle and getting preventive care are important in promoting health and wellness. Ask your health care provider about:  The right schedule for you to have regular tests and exams.  Things you can do on your own to prevent diseases and keep yourself healthy. What should I know about diet, weight, and exercise? Eat a healthy diet   Eat a diet that includes plenty of vegetables, fruits, low-fat dairy products, and lean protein.  Do not eat a lot of foods that are high in solid fats, added sugars, or sodium. Maintain a healthy weight Body mass index (BMI) is a measurement that can be used to identify possible weight problems. It estimates body fat based on height and weight. Your health care provider can help determine your BMI and help you achieve or maintain a healthy weight. Get regular exercise Get regular exercise. This is one of the most important things you can do for your health. Most adults should:  Exercise for at least 150 minutes each week. The exercise should increase your heart rate and make you sweat (moderate-intensity exercise).  Do strengthening exercises at least twice a week. This is in addition to the moderate-intensity exercise.  Spend less time sitting. Even light physical activity can be beneficial. Watch cholesterol and blood lipids Have your blood tested for lipids and cholesterol at 50 years of age, then have this test every 5 years. You may need to have your cholesterol levels checked more often if:  Your lipid or cholesterol levels are high.  You are older than 50 years of age.  You are at high risk for heart disease. What should I know about cancer screening? Many types of cancers can be detected early and may often be prevented.  Depending on your health history and family history, you may need to have cancer screening at various ages. This may include screening for:  Colorectal cancer.  Prostate cancer.  Skin cancer.  Lung cancer. What should I know about heart disease, diabetes, and high blood pressure? Blood pressure and heart disease  High blood pressure causes heart disease and increases the risk of stroke. This is more likely to develop in people who have high blood pressure readings, are of African descent, or are overweight.  Talk with your health care provider about your target blood pressure readings.  Have your blood pressure checked: ? Every 3-5 years if you are 45-36 years of age. ? Every year if you are 72 years old or older.  If you are between the ages of 64 and 34 and are a current or former smoker, ask your health care provider if you should have a one-time screening for abdominal aortic aneurysm (AAA). Diabetes Have regular diabetes screenings. This checks your fasting blood sugar level. Have the screening done:  Once every three years after age 70 if you are at a normal weight and have a low risk for diabetes.  More often and at a younger age if you are overweight or have a high risk for diabetes. What should I know about preventing infection? Hepatitis B If you have a higher risk for hepatitis B, you should be screened for this virus. Talk with your health care provider to find out if you are at risk for hepatitis B infection. Hepatitis C  Blood testing is recommended for:  Everyone born from [birth year] through 1966.  Anyone with known risk factors for hepatitis C. Sexually transmitted infections (STIs)  You should be screened each year for STIs, including gonorrhea and chlamydia, if: ? You are sexually active and are younger than 50 years of age. ? You are older than 50 years of age and your health care provider tells you that you are at risk for this type of infection. ? Your sexual  activity has changed since you were last screened, and you are at increased risk for chlamydia or gonorrhea. Ask your health care provider if you are at risk.  Ask your health care provider about whether you are at high risk for HIV. Your health care provider may recommend a prescription medicine to help prevent HIV infection. If you choose to take medicine to prevent HIV, you should first get tested for HIV. You should then be tested every 3 months for as long as you are taking the medicine. Follow these instructions at home: Lifestyle  Do not use any products that contain nicotine or tobacco, such as cigarettes, e-cigarettes, and chewing tobacco. If you need help quitting, ask your health care provider.  Do not use street drugs.  Do not share needles.  Ask your health care provider for help if you need support or information about quitting drugs. Alcohol use  Do not drink alcohol if your health care provider tells you not to drink.  If you drink alcohol: ? Limit how much you have to 0-2 drinks a day. ? Be aware of how much alcohol is in your drink. In the [place] one drink equals one 12 oz bottle of beer (355 mL), one 5 oz glass of wine (148 mL), or one 1 oz glass of hard liquor (44 mL). General instructions  Schedule regular health, dental, and eye exams.  Stay current with your vaccines.  Tell your health care provider if: ? You often feel depressed. ? You have ever been abused or do not feel safe at home. Summary  Adopting a healthy lifestyle and getting preventive care are important in promoting health and wellness.  Follow your health care provider's instructions about healthy diet, exercising, and getting tested or screened for diseases.  Follow your health care provider's instructions on monitoring your cholesterol and blood pressure. This information is not intended to replace advice given to you by your health care provider. Make sure you discuss any questions you have  with your health care provider. Document Revised: 12/12/2017 Document Reviewed: 12/12/2017 Elsevier Patient Education  2020 Elsevier Inc.   Rash, Adult  A rash is a change in the color of your skin. A rash can also change the way your skin feels. There are many different conditions and factors that can cause a rash. Follow these instructions at home: The goal of treatment is to stop the itching and keep the rash from spreading. Watch for any changes in your symptoms. Let your doctor know about them. Follow these instructions to help with your condition: Medicine Take or apply over-the-counter and prescription medicines only as told by your doctor. These may include medicines:  To treat red or swollen skin (corticosteroid creams).  To treat itching.  To treat an allergy (oral antihistamines).  To treat very bad symptoms (oral corticosteroids).  Skin care  Put cool cloths (compresses) on the affected areas.  Do not scratch or rub your skin.  Avoid covering the rash. Make sure that the rash is exposed  to air as much as possible. Managing itching and discomfort  Avoid hot showers or baths. These can make itching worse. A cold shower may help.  Try taking a bath with: ? Epsom salts. You can get these at your local pharmacy or grocery store. Follow the instructions on the package. ? Baking soda. Pour a small amount into the bath as told by your doctor. ? Colloidal oatmeal. You can get this at your local pharmacy or grocery store. Follow the instructions on the package.  Try putting baking soda paste onto your skin. Stir water into baking soda until it gets like a paste.  Try putting on a lotion that relieves itchiness (calamine lotion).  Keep cool and out of the sun. Sweating and being hot can make itching worse. General instructions   Rest as needed.  Drink enough fluid to keep your pee (urine) pale yellow.  Wear loose-fitting clothing.  Avoid scented soaps, detergents,  and perfumes. Use gentle soaps, detergents, perfumes, and other cosmetic products.  Avoid anything that causes your rash. Keep a journal to help track what causes your rash. Write down: ? What you eat. ? What cosmetic products you use. ? What you drink. ? What you wear. This includes jewelry.  Keep all follow-up visits as told by your doctor. This is important. Contact a doctor if:  You sweat at night.  You lose weight.  You pee (urinate) more than normal.  You pee less than normal, or you notice that your pee is a darker color than normal.  You feel weak.  You throw up (vomit).  Your skin or the whites of your eyes look yellow (jaundice).  Your skin: ? Tingles. ? Is numb.  Your rash: ? Does not go away after a few days. ? Gets worse.  You are: ? More thirsty than normal. ? More tired than normal.  You have: ? New symptoms. ? Pain in your belly (abdomen). ? A fever. ? Watery poop (diarrhea). Get help right away if:  You have a fever and your symptoms suddenly get worse.  You start to feel mixed up (confused).  You have a very bad headache or a stiff neck.  You have very bad joint pains or stiffness.  You have jerky movements that you cannot control (seizure).  Your rash covers all or most of your body. The rash may or may not be painful.  You have blisters that: ? Are on top of the rash. ? Grow larger. ? Grow together. ? Are painful. ? Are inside your nose or mouth.  You have a rash that: ? Looks like purple pinprick-sized spots all over your body. ? Has a "bull's eye" or looks like a target. ? Is red and painful, causes your skin to peel, and is not from being in the sun too long. Summary  A rash is a change in the color of your skin. A rash can also change the way your skin feels.  The goal of treatment is to stop the itching and keep the rash from spreading.  Take or apply over-the-counter and prescription medicines only as told by your  doctor.  Contact a doctor if you have new symptoms or symptoms that get worse.  Keep all follow-up visits as told by your doctor. This is important. This information is not intended to replace advice given to you by your health care provider. Make sure you discuss any questions you have with your health care provider. Document Revised: 04/12/2018 Document  Reviewed: 07/23/2017 Elsevier Patient Education  [employer].

## 2019-02-05 ENCOUNTER — Encounter: Payer: Self-pay | Admitting: Nurse Practitioner

## 2019-02-05 DIAGNOSIS — Z8616 Personal history of COVID-19: Secondary | ICD-10-CM

## 2019-02-05 HISTORY — DX: Personal history of COVID-19: Z86.16

## 2019-02-05 LAB — CMP14+EGFR
ALT: 47 IU/L — ABNORMAL HIGH (ref 0–44)
AST: 49 IU/L — ABNORMAL HIGH (ref 0–40)
Albumin/Globulin Ratio: 1.5 (ref 1.2–2.2)
Albumin: 4.2 g/dL (ref 4.0–5.0)
Alkaline Phosphatase: 85 IU/L (ref 39–117)
BUN/Creatinine Ratio: 10 (ref 9–20)
BUN: 18 mg/dL (ref 6–24)
Bilirubin Total: 0.5 mg/dL (ref 0.0–1.2)
CO2: 23 mmol/L (ref 20–29)
Calcium: 9.6 mg/dL (ref 8.7–10.2)
Chloride: 103 mmol/L (ref 96–106)
Creatinine, Ser: 1.86 mg/dL — ABNORMAL HIGH (ref 0.76–1.27)
GFR calc Af Amer: 48 mL/min/{1.73_m2} — ABNORMAL LOW (ref >59)
GFR calc non Af Amer: 41 mL/min/{1.73_m2} — ABNORMAL LOW (ref >59)
Globulin, Total: 2.8 g/dL (ref 1.5–4.5)
Glucose: 81 mg/dL (ref 65–99)
Potassium: 4.3 mmol/L (ref 3.5–5.2)
Sodium: 140 mmol/L (ref 134–144)
Total Protein: 7 g/dL (ref 6.0–8.5)

## 2019-02-05 LAB — LIPID PANEL
Chol/HDL Ratio: 6 ratio — ABNORMAL HIGH (ref 0.0–5.0)
Cholesterol, Total: 192 mg/dL (ref 100–199)
HDL: 32 mg/dL — ABNORMAL LOW (ref >39)
LDL Chol Calc (NIH): 119 mg/dL — ABNORMAL HIGH (ref 0–99)
Triglycerides: 233 mg/dL — ABNORMAL HIGH (ref 0–149)
VLDL Cholesterol Cal: 41 mg/dL — ABNORMAL HIGH (ref 5–40)

## 2019-02-05 LAB — PSA: Prostate Specific Ag, Serum: 0.8 ng/mL (ref 0.0–4.0)

## 2019-02-05 LAB — CBC
Hematocrit: 38 % (ref 37.5–51.0)
Hemoglobin: 12.2 g/dL — ABNORMAL LOW (ref 13.0–17.7)
MCH: 28.6 pg (ref 26.6–33.0)
MCHC: 32.1 g/dL (ref 31.5–35.7)
MCV: 89 fL (ref 79–97)
Platelets: 248 10*3/uL (ref 150–450)
RBC: 4.27 x10E6/uL (ref 4.14–5.80)
RDW: 12 % (ref 11.6–15.4)
WBC: 5.2 10*3/uL (ref 3.4–10.8)

## 2019-02-05 LAB — URIC ACID: Uric Acid: 7.3 mg/dL (ref 3.8–8.4)

## 2019-02-11 ENCOUNTER — Encounter: Payer: Self-pay | Admitting: Nurse Practitioner

## 2019-02-12 ENCOUNTER — Telehealth: Payer: Self-pay

## 2019-02-12 NOTE — Telephone Encounter (Signed)
I called patient to notify him that his forms are completed and he can come pick them up. He stated he will call me back with a fax number so that his forms can be faxed. YRL,RMA

## 2019-02-13 ENCOUNTER — Encounter: Payer: Self-pay | Admitting: Nurse Practitioner

## 2019-05-01 ENCOUNTER — Encounter: Payer: Self-pay | Admitting: Nurse Practitioner

## 2019-08-03 ENCOUNTER — Other Ambulatory Visit: Payer: Self-pay | Admitting: Nurse Practitioner

## 2019-08-22 ENCOUNTER — Encounter: Payer: Self-pay | Admitting: Nurse Practitioner

## 2019-12-16 DIAGNOSIS — Z029 Encounter for administrative examinations, unspecified: Secondary | ICD-10-CM

## 2020-02-05 ENCOUNTER — Encounter: Payer: PRIVATE HEALTH INSURANCE | Admitting: Nurse Practitioner

## 2020-05-11 ENCOUNTER — Encounter: Payer: Self-pay | Admitting: Nurse Practitioner

## 2020-05-12 ENCOUNTER — Ambulatory Visit (INDEPENDENT_AMBULATORY_CARE_PROVIDER_SITE_OTHER): Payer: Self-pay | Admitting: Nurse Practitioner

## 2020-05-12 ENCOUNTER — Other Ambulatory Visit: Payer: Self-pay

## 2020-05-12 ENCOUNTER — Encounter: Payer: Self-pay | Admitting: Nurse Practitioner

## 2020-05-12 VITALS — BP 130/70 | HR 71 | Temp 98.3°F | Ht 68.4 in | Wt 237.4 lb

## 2020-05-12 DIAGNOSIS — R21 Rash and other nonspecific skin eruption: Secondary | ICD-10-CM

## 2020-05-12 DIAGNOSIS — I129 Hypertensive chronic kidney disease with stage 1 through stage 4 chronic kidney disease, or unspecified chronic kidney disease: Secondary | ICD-10-CM

## 2020-05-12 DIAGNOSIS — N1831 Chronic kidney disease, stage 3a: Secondary | ICD-10-CM

## 2020-05-12 DIAGNOSIS — Z1159 Encounter for screening for other viral diseases: Secondary | ICD-10-CM

## 2020-05-12 DIAGNOSIS — Z6835 Body mass index (BMI) 35.0-35.9, adult: Secondary | ICD-10-CM

## 2020-05-12 DIAGNOSIS — I10 Essential (primary) hypertension: Secondary | ICD-10-CM

## 2020-05-12 LAB — POCT URINALYSIS DIPSTICK
Bilirubin, UA: NEGATIVE
Blood, UA: NEGATIVE
Glucose, UA: NEGATIVE
Ketones, UA: NEGATIVE
Leukocytes, UA: NEGATIVE
Nitrite, UA: NEGATIVE
Protein, UA: POSITIVE — AB
Spec Grav, UA: <=1.005 — AB (ref 1.010–1.025)
Urobilinogen, UA: 0.2 E.U./dL
pH, UA: 6 (ref 5.0–8.0)

## 2020-05-12 LAB — POCT UA - MICROALBUMIN
Albumin/Creatinine Ratio, Urine, POC: >300
Creatinine, POC: 50 mg/dL
Microalbumin Ur, POC: 150 mg/L

## 2020-05-12 MED ORDER — HYDROCORTISONE 1 % EX CREA: TOPICAL_CREAM | CUTANEOUS | 1 refills | Status: AC

## 2020-05-12 MED ORDER — PREDNISONE 10 MG (21) PO TBPK: ORAL_TABLET | ORAL | 0 refills | Status: DC

## 2020-05-12 NOTE — Addendum Note (Signed)
Addended by: Randa Lynn T on: 05/12/2020 02:07 PM   Modules accepted: Orders

## 2020-05-12 NOTE — Progress Notes (Signed)
I,Yamilka Roman [employer] as a Education administrator for [employer], FNP.,have documented all relevant documentation on the behalf of Minette Brine, FNP,as directed by  Minette Brine, FNP while in the presence of Minette Brine, [place]. This visit occurred during the SARS-CoV-2 public health emergency.  Safety protocols were in place, including screening questions prior to the visit, additional usage of staff PPE, and extensive cleaning of exam room while observing appropriate contact time as indicated for disinfecting solutions.  Subjective:     Patient ID: Chase Walsh , male    DOB: February 16, 1970 , 50 y.o.   MRN: [id number]   Chief Complaint  Patient presents with  . Rash    Patient stated he broke out in a rash and has had bumps all over his body. He stated the breakout has gotten worse about a week ago.     HPI  He has not been seen since February 2021. He has been without his blood pressure medication for 2-3 months.  He is no longer working since June. 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.    Wt Readings from Last 3 Encounters: 05/12/20 : 237 lb 6.4 oz (107.7 kg) 02/04/19 : 192 lb 9.6 oz (87.4 kg) 01/23/19 : 187 lb (84.8 kg)    Hypertension This is a chronic problem. The current episode started more than 1 year ago. The problem is unchanged. The problem is controlled. Pertinent negatives include no anxiety, chest pain, headaches, palpitations or shortness of breath. There are no associated agents to hypertension. Risk factors for coronary artery disease include obesity. Past treatments include calcium channel blockers and angiotensin blockers. The current treatment provides no improvement. There are no compliance problems.  There is no history of angina. There is no history of chronic renal disease.  Rash This is a new problem. The current episode started in the past 7 days. The affected locations include the chest, left arm and right arm. The rash is characterized by itchiness. He was exposed to  nothing. Pertinent negatives include no anorexia, congestion, cough, fever, shortness of breath or sore throat. Treatments tried: calamine lotion, benadryl x 3 days.      Past Medical History:  Diagnosis Date  . Diverticulosis   . Gout   . Hypertension   . Vitiligo      Family History  Problem Relation Age of Onset  . Breast cancer Mother   . Arthritis Mother   . Cancer Paternal Uncle        maybe colon  . Inflammatory bowel disease Neg Hx      Current Outpatient Medications:  .  colchicine 0.6 MG tablet, Take 1 tablet (0.6 mg total) by mouth daily., Disp: 90 tablet, Rfl: 1 .  fluocinonide (LIDEX) 0.05 % external solution, Apply 1 application topically as needed., Disp: , Rfl:  .  hydrocortisone cream 1 %, Apply to affected area 2 times daily, Disp: 30 g, Rfl: 1 .  olmesartan (BENICAR) 40 MG tablet, Take 1 tablet by mouth daily. , Disp: , Rfl:  .  ondansetron (ZOFRAN) 8 MG tablet, Take 1 tablet (8 mg total) by mouth every 8 (eight) hours as needed for nausea., Disp: 6 tablet, Rfl: 0 .  predniSONE (STERAPRED UNI-PAK 21 TAB) 10 MG (21) TBPK tablet, Take as directed, Disp: 21 tablet, Rfl: 0 .  amLODipine (NORVASC) 10 MG tablet, Take 1 tablet (10 mg total) by mouth daily. (Patient not taking: Reported on 05/12/2020), Disp: 90 tablet, Rfl: 1   Allergies  Allergen Reactions  .  Allopurinol Rash     Review of Systems  Constitutional: Negative for fever.  HENT: Negative for congestion and sore throat.   Respiratory: Negative for cough and shortness of breath.   Cardiovascular: Negative for chest pain and palpitations.  Gastrointestinal: Negative for anorexia.  Skin: Positive for rash.  Neurological: Negative for headaches.     Today's Vitals   05/12/20 1114  BP: 130/70  Pulse: 71  Temp: 98.3 F (36.8 C)  TempSrc: Oral  Weight: 237 lb 6.4 oz (107.7 kg)  Height: 5' 8.4" (1.737 m)  PainSc: 0-No pain   Body mass index is 35.68 kg/m.   Objective:  Physical Exam Vitals  reviewed.  Constitutional:      General: He is not in acute distress.    Appearance: Normal appearance. He is obese.  Cardiovascular:     Rate and Rhythm: Normal rate and regular rhythm.     Pulses: Normal pulses.     Heart sounds: Normal heart sounds. No murmur heard.   Pulmonary:     Effort: Pulmonary effort is normal. No respiratory distress.     Breath sounds: Normal breath sounds. No wheezing.  Skin:    General: Skin is warm.     Findings: Rash (papular rash to arms, axilla noted) present.  Neurological:     General: No focal deficit present.     Mental Status: He is alert and oriented to person, place, and time. Mental status is at baseline.     Cranial Nerves: No cranial nerve deficit.     Motor: No weakness.  Psychiatric:        Mood and Affect: Mood normal.        Behavior: Behavior normal.        Thought Content: Thought content normal.        Judgment: Judgment normal.         Assessment And Plan:     1. Rash and nonspecific skin eruption  Papular rash to arms and axilla. Slightly erythematous  Will treat with steroid dose pack  - predniSONE (STERAPRED UNI-PAK 21 TAB) 10 MG (21) TBPK tablet; Take as directed  Dispense: 21 tablet; Refill: 0 - CBC with Differential/Platelet - hydrocortisone cream 1 %; Apply to affected area 2 times daily  Dispense: 30 g; Refill: 1  2. Essential hypertension  Chronic, doing well in spite of being without his blood pressure medications for 2-3 months  Refilled his blood pressure medications - CMP14+EGFR  3. Stage 3a chronic kidney disease (HCC) - CMP14+EGFR  4. Class 2 severe obesity due to excess calories with serious comorbidity and body mass index (BMI) of 35.0 to 35.9 in adult [facility])  He has gained approximately 50 lbs since his last visit in Feb 2021  I have discussed with him the importance of exercising and eating healthy  Also discussed how increase in weight can increase his risk for his blood pressure to go up  or develop diabetes and/or joint pain - Hemoglobin A1c  5. Encounter for hepatitis C screening test for low risk patient  Will check Hepatitis C screening due to recent recommendations to screen all adults 18 years and older - Hepatitis C antibody     Patient was given opportunity to ask questions. Patient verbalized understanding of the plan and was able to repeat key elements of the plan. All questions were answered to their satisfaction.  Minette Brine, FNP   I, Minette Brine, FNP, have reviewed all documentation for this visit. The documentation  on 05/12/20 for the exam, diagnosis, procedures, and orders are all accurate and complete.   IF YOU HAVE BEEN REFERRED TO A SPECIALIST, IT MAY TAKE 1-2 WEEKS TO SCHEDULE/PROCESS THE REFERRAL. IF YOU HAVE NOT HEARD FROM US/SPECIALIST IN TWO WEEKS, PLEASE GIVE [place] A CALL AT [phone] X [phone].   THE PATIENT IS ENCOURAGED TO PRACTICE SOCIAL DISTANCING DUE TO THE COVID-19 PANDEMIC.

## 2020-05-13 ENCOUNTER — Telehealth: Payer: Self-pay

## 2020-05-13 LAB — CBC WITH DIFFERENTIAL/PLATELET
Basophils Absolute: 0 10*3/uL (ref 0.0–0.2)
Basos: 1 %
EOS (ABSOLUTE): 0.3 10*3/uL (ref 0.0–0.4)
Eos: 5 %
Hematocrit: 45.1 % (ref 37.5–51.0)
Hemoglobin: 15 g/dL (ref 13.0–17.7)
Immature Grans (Abs): 0.1 10*3/uL (ref 0.0–0.1)
Immature Granulocytes: 1 %
Lymphocytes Absolute: 1.6 10*3/uL (ref 0.7–3.1)
Lymphs: 27 %
MCH: 29.8 pg (ref 26.6–33.0)
MCHC: 33.3 g/dL (ref 31.5–35.7)
MCV: 90 fL (ref 79–97)
Monocytes Absolute: 0.5 10*3/uL (ref 0.1–0.9)
Monocytes: 9 %
Neutrophils Absolute: 3.4 10*3/uL (ref 1.4–7.0)
Neutrophils: 57 %
Platelets: 250 10*3/uL (ref 150–450)
RBC: 5.03 x10E6/uL (ref 4.14–5.80)
RDW: 11.7 % (ref 11.6–15.4)
WBC: 5.9 10*3/uL (ref 3.4–10.8)

## 2020-05-13 LAB — CMP14+EGFR
ALT: 45 IU/L — ABNORMAL HIGH (ref 0–44)
AST: 34 IU/L (ref 0–40)
Albumin/Globulin Ratio: 1.5 (ref 1.2–2.2)
Albumin: 4.6 g/dL (ref 3.8–4.9)
Alkaline Phosphatase: 84 IU/L (ref 44–121)
BUN/Creatinine Ratio: 7 — ABNORMAL LOW (ref 9–20)
BUN: 15 mg/dL (ref 6–24)
Bilirubin Total: 0.6 mg/dL (ref 0.0–1.2)
CO2: 25 mmol/L (ref 20–29)
Calcium: 10 mg/dL (ref 8.7–10.2)
Chloride: 99 mmol/L (ref 96–106)
Creatinine, Ser: 2.11 mg/dL — ABNORMAL HIGH (ref 0.76–1.27)
Globulin, Total: 3 g/dL (ref 1.5–4.5)
Glucose: 97 mg/dL (ref 65–99)
Potassium: 4.9 mmol/L (ref 3.5–5.2)
Sodium: 137 mmol/L (ref 134–144)
Total Protein: 7.6 g/dL (ref 6.0–8.5)
eGFR: 37 mL/min/{1.73_m2} — ABNORMAL LOW (ref >59)

## 2020-05-13 LAB — HEMOGLOBIN A1C
Est. average glucose Bld gHb Est-mCnc: 114 mg/dL
Hgb A1c MFr Bld: 5.6 % (ref 4.8–5.6)

## 2020-05-13 LAB — HEPATITIS C ANTIBODY: Hep C Virus Ab: 0.1 s/co ratio (ref 0.0–0.9)

## 2020-05-13 NOTE — Telephone Encounter (Signed)
I left the pt a message if he could please call or respond in his mychart to the question in he lab results.

## 2020-05-13 NOTE — Telephone Encounter (Signed)
-----   Message from [name], FNP sent at 05/13/2020 10:09 AM EDT ----- Your kidney functions are worse, do you have an appt with nephrology? Liver functions are improved. HgbA1c is normal. Blood levels are normal. Hepatitis c is negative

## 2020-07-17 DIAGNOSIS — M109 Gout, unspecified: Secondary | ICD-10-CM | POA: Insufficient documentation

## 2020-07-18 ENCOUNTER — Encounter: Payer: Self-pay | Admitting: Nurse Practitioner

## 2020-07-20 ENCOUNTER — Other Ambulatory Visit: Payer: Self-pay

## 2020-07-20 ENCOUNTER — Encounter: Payer: Self-pay | Admitting: Nurse Practitioner

## 2020-07-20 ENCOUNTER — Ambulatory Visit (INDEPENDENT_AMBULATORY_CARE_PROVIDER_SITE_OTHER): Payer: Self-pay | Admitting: Nurse Practitioner

## 2020-07-20 VITALS — BP 140/96 | HR 88 | Temp 98.1°F | Ht 68.0 in | Wt 223.4 lb

## 2020-07-20 DIAGNOSIS — N1832 Chronic kidney disease, stage 3b: Secondary | ICD-10-CM

## 2020-07-20 DIAGNOSIS — M10071 Idiopathic gout, right ankle and foot: Secondary | ICD-10-CM

## 2020-07-20 DIAGNOSIS — M7989 Other specified soft tissue disorders: Secondary | ICD-10-CM

## 2020-07-20 DIAGNOSIS — I10 Essential (primary) hypertension: Secondary | ICD-10-CM

## 2020-07-20 DIAGNOSIS — I129 Hypertensive chronic kidney disease with stage 1 through stage 4 chronic kidney disease, or unspecified chronic kidney disease: Secondary | ICD-10-CM

## 2020-07-20 MED ORDER — PREDNISONE 10 MG (21) PO TBPK: ORAL_TABLET | ORAL | 0 refills | Status: DC

## 2020-07-20 MED ORDER — OLMESARTAN MEDOXOMIL 40 MG PO TABS: 40.0000 mg | ORAL_TABLET | Freq: Every day | ORAL | 2 refills | Status: DC

## 2020-07-20 NOTE — Patient Instructions (Signed)
Gout Gout is painful swelling of your joints. Gout is a type of arthritis. It is caused by having too much uric acid in your body. Uric acid is a chemical that is made when your body breaks down substances called purines. If your body has too much uric acid, sharp crystals can form and build up in your joints. This causes pain and swelling. Gout attacks can happen quickly and be very painful (acute gout). Over time, the attacks can affect more joints and happen more often (chronic gout). What are the causes? Too much uric acid in your blood. This can happen because: Your kidneys do not remove enough uric acid from your blood. Your body makes too much uric acid. You eat too many foods that are high in purines. These foods include organ meats, some seafood, and beer. Trauma or stress. What increases the risk? Having a family history of gout. Being male and middle-aged. Being male and having gone through menopause. Being very overweight (obese). Drinking alcohol, especially beer. Not having enough water in the body (being dehydrated). Losing weight too quickly. Having an organ transplant. Having lead poisoning. Taking certain medicines. Having kidney disease. Having a skin condition called psoriasis. What are the signs or symptoms? An attack of acute gout usually happens in just one joint. The most common place is the big toe. Attacks often start at night. Other joints that may be affected include joints of the feet, ankle, knee, fingers, wrist, or elbow. Symptoms of an attack may include: Very bad pain. Warmth. Swelling. Stiffness. Shiny, red, or purple skin. Tenderness. The affected joint may be very painful to touch. Chills and fever. Chronic gout may cause symptoms more often. More joints may be involved. You may also have white or yellow lumps (tophi) on your hands or feet or in other areas near your joints. How is this treated? Treatment for this condition has two phases:  treating an acute attack and preventing future attacks. Acute gout treatment may include: NSAIDs. Steroids. These are taken by mouth or injected into a joint. Colchicine. This medicine relieves pain and swelling. It can be given by mouth or through an IV tube. Preventive treatment may include: Taking small doses of NSAIDs or colchicine daily. Using a medicine that reduces uric acid levels in your blood. Making changes to your diet. You may need to see a food expert (dietitian) about what to eat and drink to prevent gout. Follow these instructions at home: During a gout attack  If told, put ice on the painful area: Put ice in a plastic bag. Place a towel between your skin and the bag. Leave the ice on for 20 minutes, 2-3 times a day. Raise (elevate) the painful joint above the level of your heart as often as you can. Rest the joint as much as possible. If the joint is in your leg, you may be given crutches. Follow instructions from your doctor about what you cannot eat or drink. Avoiding future gout attacks Eat a low-purine diet. Avoid foods and drinks such as: Liver. Kidney. Anchovies. Asparagus. Herring. Mushrooms. Mussels. Beer. Stay at a healthy weight. If you want to lose weight, talk with your doctor. Do not lose weight too fast. Start or continue an exercise plan as told by your doctor. Eating and drinking Drink enough fluids to keep your pee (urine) pale yellow. If you drink alcohol: Limit how much you use to: 0-1 drink a day for women. 0-2 drinks a day for men. Be aware of   how much alcohol is in your drink. In the [place] one drink equals one 12 oz bottle of beer (355 mL), one 5 oz glass of wine (148 mL), or one 1 oz glass of hard liquor (44 mL). General instructions Take over-the-counter and prescription medicines only as told by your doctor. Do not drive or use heavy machinery while taking prescription pain medicine. Return to your normal activities as told by your  doctor. Ask your doctor what activities are safe for you. Keep all follow-up visits as told by your doctor. This is important. Contact a doctor if: You have another gout attack. You still have symptoms of a gout attack after 10 days of treatment. You have problems (side effects) because of your medicines. You have chills or a fever. You have burning pain when you pee (urinate). You have pain in your lower back or belly. Get help right away if: You have very bad pain. Your pain cannot be controlled. You cannot pee. Summary Gout is painful swelling of the joints. The most common site of pain is the big toe, but it can affect other joints. Medicines and avoiding some foods can help to prevent and treat gout attacks. This information is not intended to replace advice given to you by your health care provider. Make sure you discuss any questions you have with your health care provider. Document Revised: 07/11/2017 Document Reviewed: 07/11/2017 Elsevier Patient Education  2022 Elsevier Inc.  

## 2020-07-20 NOTE — Progress Notes (Signed)
[employer] as a Education administrator for [employer], NP.,have documented all relevant documentation on the behalf of [employer], NP,as directed by  Bary Castilla, NP while in the presence of Bary Castilla, NP.  This visit occurred during the SARS-CoV-2 public health emergency.  Safety protocols were in place, including screening questions prior to the visit, additional usage of staff PPE, and extensive cleaning of exam room while observing appropriate contact time as indicated for disinfecting solutions.  Subjective:     Patient ID: Chase Walsh , male    DOB: 1970/05/21 , 50 y.o.   MRN: [id number]   Chief Complaint  Patient presents with   Gout     HPI  Pt presents today with gout. Right foot, ankle and right hand. Pt said its been on and off for over a month. His right foot and ankle started this past Saturday. Pt would like list of foods that he could eat being his is in stage 3 CKD.    Past Medical History:  Diagnosis Date   Diverticulosis    Gout    Hypertension    Vitiligo      Family History  Problem Relation Age of Onset   Breast cancer Mother    Arthritis Mother    Cancer Paternal Uncle        maybe colon   Inflammatory bowel disease Neg Hx      Current Outpatient Medications:    amLODipine (NORVASC) 10 MG tablet, Take 1 tablet (10 mg total) by mouth daily., Disp: 90 tablet, Rfl: 1   colchicine 0.6 MG tablet, Take 1 tablet (0.6 mg total) by mouth daily., Disp: 90 tablet, Rfl: 1   fluocinonide (LIDEX) 0.05 % external solution, Apply 1 application topically as needed., Disp: , Rfl:    hydrocortisone cream 1 %, Apply to affected area 2 times daily, Disp: 30 g, Rfl: 1   predniSONE (STERAPRED UNI-PAK 21 TAB) 10 MG (21) TBPK tablet, Take as directed, Disp: 21 tablet, Rfl: 0   olmesartan (BENICAR) 40 MG tablet, Take 1 tablet (40 mg total) by mouth daily., Disp: 30 tablet, Rfl: 2   ondansetron (ZOFRAN) 8 MG tablet, Take 1 tablet (8 mg total) by  mouth every 8 (eight) hours as needed for nausea. (Patient not taking: Reported on 07/20/2020), Disp: 6 tablet, Rfl: 0   Allergies  Allergen Reactions   Allopurinol Rash     Review of Systems  Constitutional:  Negative for chills and fever.  HENT: Negative.  Negative for congestion and sinus pain.   Eyes: Negative.   Respiratory: Negative.  Negative for cough, shortness of breath and wheezing.   Cardiovascular: Negative.  Negative for chest pain and palpitations.  Gastrointestinal: Negative.   Endocrine: Negative.   Genitourinary: Negative.   Musculoskeletal:  Positive for joint swelling. Negative for neck pain and neck stiffness.       Swelling in both feet   Skin: Negative.   Allergic/Immunologic: Negative.   Neurological: Negative.  Negative for numbness.  Hematological: Negative.   Psychiatric/Behavioral: Negative.      Today's Vitals   07/20/20 1637  BP: (!) 140/96  Pulse: 88  Temp: 98.1 F (36.7 C)  Weight: 223 lb 6.4 oz (101.3 kg)  Height: _0  (1.727 m)   Body mass index is 33.97 kg/m.   Objective:  Physical Exam Constitutional:      Appearance: Normal appearance.  HENT:     Head: Normocephalic and atraumatic.  Cardiovascular:     Rate and Rhythm:  Normal rate and regular rhythm.     Pulses: Normal pulses.     Heart sounds: Normal heart sounds. No murmur heard. Pulmonary:     Effort: Pulmonary effort is normal. No respiratory distress.     Breath sounds: Normal breath sounds. No wheezing.  Musculoskeletal:        General: Swelling present.     Right lower leg: Edema present.     Left lower leg: Edema present.     Comments: Gout flare up and swelling in both feet.   Skin:    General: Skin is warm and dry.     Capillary Refill: Capillary refill takes less than 2 seconds.  Neurological:     Mental Status: He is alert and oriented to person, place, and time.        Assessment And Plan:     1. Acute idiopathic gout of right foot -Patient has allergy  to allopurinol and states that colchine does not work for him. Will check his uric acid and send referral to rheumatology  - Uric acid - Ambulatory referral to Rheumatology  2. Stage 3b chronic kidney disease ([place]) -Patient will schedule a appt with a nephrologist  - CMP14+EGFR  3. Essential hypertension Limit the intake of processed foods and salt intake. You should increase your intake of green vegetables and fruits. Limit the use of alcohol. Limit fast foods and fried foods. Avoid high fatty saturated and trans fat foods. Keep yourself hydrated with drinking water. Avoid red meats. Eat lean meats instead. Exercise for atleast 30-45 min for atleast 4-5 times a week.  -pt. BP was 140/96, pt states he has not been taking his olmesartan because he ran out. Will refill it today. Advised patient to check his BP along with taking his meds.  - CMP14+EGFR - olmesartan (BENICAR) 40 MG tablet; Take 1 tablet (40 mg total) by mouth daily.  Dispense: 30 tablet; Refill: 2  4. Bilateral swelling of feet - predniSONE (STERAPRED UNI-PAK 21 TAB) 10 MG (21) TBPK tablet; Take as directed  Dispense: 21 tablet; Refill: 0   Follow up: if symptoms persist or do not get better.   The patient was encouraged to call or send a message through [place] for any questions or concerns.   Patient was given opportunity to ask questions. Patient verbalized understanding of the plan and was able to repeat key elements of the plan. All questions were answered to their satisfaction.  Raman Drayk Humbarger, DNP   I, Raman Lycan Davee have reviewed all documentation for this visit. The documentation on 07/20/20 for the exam, diagnosis, procedures, and orders are all accurate and complete.    IF YOU HAVE BEEN REFERRED TO A SPECIALIST, IT MAY TAKE 1-2 WEEKS TO SCHEDULE/PROCESS THE REFERRAL. IF YOU HAVE NOT HEARD FROM US/SPECIALIST IN TWO WEEKS, PLEASE GIVE [place] A CALL AT [phone].   THE PATIENT IS ENCOURAGED TO PRACTICE SOCIAL  DISTANCING DUE TO THE COVID-19 PANDEMIC.

## 2020-07-21 ENCOUNTER — Other Ambulatory Visit: Payer: Self-pay

## 2020-07-21 DIAGNOSIS — I10 Essential (primary) hypertension: Secondary | ICD-10-CM

## 2020-07-21 LAB — CMP14+EGFR
ALT: 20 IU/L (ref 0–44)
AST: 20 IU/L (ref 0–40)
Albumin/Globulin Ratio: 1.6 (ref 1.2–2.2)
Albumin: 4.6 g/dL (ref 3.8–4.9)
Alkaline Phosphatase: 104 IU/L (ref 44–121)
BUN/Creatinine Ratio: 5 — ABNORMAL LOW (ref 9–20)
BUN: 12 mg/dL (ref 6–24)
Bilirubin Total: 0.8 mg/dL (ref 0.0–1.2)
CO2: 22 mmol/L (ref 20–29)
Calcium: 10 mg/dL (ref 8.7–10.2)
Chloride: 100 mmol/L (ref 96–106)
Creatinine, Ser: 2.24 mg/dL — ABNORMAL HIGH (ref 0.76–1.27)
Globulin, Total: 2.9 g/dL (ref 1.5–4.5)
Glucose: 103 mg/dL — ABNORMAL HIGH (ref 65–99)
Potassium: 4.4 mmol/L (ref 3.5–5.2)
Sodium: 136 mmol/L (ref 134–144)
Total Protein: 7.5 g/dL (ref 6.0–8.5)
eGFR: 35 mL/min/{1.73_m2} — ABNORMAL LOW (ref >59)

## 2020-07-21 LAB — URIC ACID: Uric Acid: 9.1 mg/dL — ABNORMAL HIGH (ref 3.8–8.4)

## 2020-07-21 MED ORDER — OLMESARTAN MEDOXOMIL 40 MG PO TABS: 40.0000 mg | ORAL_TABLET | Freq: Every day | ORAL | 1 refills | Status: DC

## 2020-09-01 ENCOUNTER — Ambulatory Visit: Payer: Self-pay | Admitting: Internal Medicine

## 2020-11-15 ENCOUNTER — Ambulatory Visit: Payer: Self-pay | Admitting: Nurse Practitioner

## 2021-01-24 ENCOUNTER — Encounter: Payer: Self-pay | Admitting: Nurse Practitioner

## 2021-01-26 ENCOUNTER — Other Ambulatory Visit: Payer: Self-pay

## 2021-01-26 ENCOUNTER — Encounter: Payer: Self-pay | Admitting: Nurse Practitioner

## 2021-01-26 ENCOUNTER — Ambulatory Visit (INDEPENDENT_AMBULATORY_CARE_PROVIDER_SITE_OTHER): Payer: Self-pay | Admitting: Nurse Practitioner

## 2021-01-26 VITALS — BP 130/70 | HR 73 | Temp 98.3°F | Ht 68.0 in | Wt 237.6 lb

## 2021-01-26 DIAGNOSIS — N183 Chronic kidney disease, stage 3 unspecified: Secondary | ICD-10-CM

## 2021-01-26 DIAGNOSIS — E782 Mixed hyperlipidemia: Secondary | ICD-10-CM

## 2021-01-26 DIAGNOSIS — Z6836 Body mass index (BMI) 36.0-36.9, adult: Secondary | ICD-10-CM

## 2021-01-26 DIAGNOSIS — E66812 Obesity, class 2: Secondary | ICD-10-CM

## 2021-01-26 DIAGNOSIS — Z139 Encounter for screening, unspecified: Secondary | ICD-10-CM

## 2021-01-26 DIAGNOSIS — I129 Hypertensive chronic kidney disease with stage 1 through stage 4 chronic kidney disease, or unspecified chronic kidney disease: Secondary | ICD-10-CM

## 2021-01-26 NOTE — Progress Notes (Signed)
I,Tianna Badgett,acting as a Education administrator for [employer], FNP.,have documented all relevant documentation on the behalf of Minette Brine, FNP,as directed by  Minette Brine, FNP while in the presence of Minette Brine, [place].  This visit occurred during the SARS-CoV-2 public health emergency.  Safety protocols were in place, including screening questions prior to the visit, additional usage of staff PPE, and extensive cleaning of exam room while observing appropriate contact time as indicated for disinfecting solutions.  Subjective:     Patient ID: Chase Walsh , male    DOB: [birth year] , 51 y.o.   MRN: [id number]   Chief Complaint  Patient presents with   Hypertension    HPI  Patient is here for HTN follow up. He is working as a Loss adjuster, chartered.   Wt Readings from Last 3 Encounters: 01/26/21 : 237 lb 9.6 oz (107.8 kg) 07/20/20 : 223 lb 6.4 oz (101.3 kg) 05/12/20 : 237 lb 6.4 oz (107.7 kg)    Hypertension This is a chronic problem. The current episode started more than 1 year ago. The problem is unchanged. The problem is controlled. Pertinent negatives include no anxiety, chest pain, headaches or palpitations. There are no associated agents to hypertension. Risk factors for coronary artery disease include obesity. Past treatments include calcium channel blockers and angiotensin blockers. The current treatment provides no improvement. There are no compliance problems.  There is no history of angina. There is no history of chronic renal disease.    Past Medical History:  Diagnosis Date   Diverticulosis    Gout    Hypertension    Vitiligo      Family History  Problem Relation Age of Onset   Breast cancer Mother    Arthritis Mother    Cancer Paternal Uncle        maybe colon   Inflammatory bowel disease Neg Hx      Current Outpatient Medications:    amLODipine (NORVASC) 10 MG tablet, Take 1 tablet (10 mg total) by mouth daily., Disp: 90 tablet, Rfl: 1   colchicine 0.6 MG tablet, Take 1 tablet  (0.6 mg total) by mouth daily., Disp: 90 tablet, Rfl: 1   fluocinonide (LIDEX) 0.05 % external solution, Apply 1 application topically as needed., Disp: , Rfl:    hydrocortisone cream 1 %, Apply to affected area 2 times daily, Disp: 30 g, Rfl: 1   olmesartan (BENICAR) 40 MG tablet, Take 1 tablet (40 mg total) by mouth daily., Disp: 90 tablet, Rfl: 1   Allergies  Allergen Reactions   Allopurinol Rash     Review of Systems  Constitutional: Negative.   Respiratory: Negative.    Cardiovascular: Negative.  Negative for chest pain, palpitations and leg swelling.  Gastrointestinal: Negative.   Neurological: Negative.  Negative for headaches.  Psychiatric/Behavioral: Negative.      Today's Vitals   01/26/21 1609  BP: 130/70  Pulse: 73  Temp: 98.3 F (36.8 C)  TempSrc: Oral  Weight: 237 lb 9.6 oz (107.8 kg)  Height: $Remove'5\' 8"'jhsxamf$  (1.727 m)   Body mass index is 36.13 kg/m.  Wt Readings from Last 3 Encounters:  01/26/21 237 lb 9.6 oz (107.8 kg)  07/20/20 223 lb 6.4 oz (101.3 kg)  05/12/20 237 lb 6.4 oz (107.7 kg)    Objective:  Physical Exam Vitals reviewed.  Constitutional:      General: He is not in acute distress.    Appearance: Normal appearance. He is obese.  Cardiovascular:     Rate and Rhythm: Normal rate  and regular rhythm.     Pulses: Normal pulses.     Heart sounds: Normal heart sounds. No murmur heard. Pulmonary:     Effort: Pulmonary effort is normal. No respiratory distress.     Breath sounds: Normal breath sounds. No wheezing.  Skin:    General: Skin is warm.     Findings: No rash.  Neurological:     General: No focal deficit present.     Mental Status: He is alert and oriented to person, place, and time. Mental status is at baseline.     Cranial Nerves: No cranial nerve deficit.     Motor: No weakness.  Psychiatric:        Mood and Affect: Mood normal.        Behavior: Behavior normal.        Thought Content: Thought content normal.        Judgment: Judgment  normal.        Assessment And Plan:     1. Benign hypertension with CKD (chronic kidney disease) stage III (HCC) Comments: Blood pressure is better controlled, continue current medications. I have discussed with him again the importance to follow up with Nephrology - CMP14+EGFR  2. Mixed hyperlipidemia Comments: Cholesterol levels were slightly elevated, he will likely need medications - CMP14+EGFR - Lipid panel  3. Class 2 severe obesity due to excess calories with serious comorbidity and body mass index (BMI) of 36.0 to 36.9 in adult [facility]) Chronic Discussed healthy diet and regular exercise options  Encouraged to exercise at least 150 minutes per week with 2 days of strength training, I have encouraged him to limit his intake of beans tend to increase weight due to high carbohydrates  He is encouraged to initially strive for BMI less than 30 to decrease cardiac risk.  - Hemoglobin A1c  4. Encounter for screening - CBC - Varicella Zoster Abs, IgG/IgM     Patient was given opportunity to ask questions. Patient verbalized understanding of the plan and was able to repeat key elements of the plan. All questions were answered to their satisfaction.  Minette Brine, FNP   I, Minette Brine, FNP, have reviewed all documentation for this visit. The documentation on 01/26/21 for the exam, diagnosis, procedures, and orders are all accurate and complete.   IF YOU HAVE BEEN REFERRED TO A SPECIALIST, IT MAY TAKE 1-2 WEEKS TO SCHEDULE/PROCESS THE REFERRAL. IF YOU HAVE NOT HEARD FROM US/SPECIALIST IN TWO WEEKS, PLEASE GIVE [place] A CALL AT [phone].   THE PATIENT IS ENCOURAGED TO PRACTICE SOCIAL DISTANCING DUE TO THE COVID-19 PANDEMIC.

## 2021-01-26 NOTE — Patient Instructions (Signed)

## 2021-01-26 NOTE — Addendum Note (Signed)
Addended by: Nelda Bucks on: 01/26/2021 05:38 PM   Modules accepted: Orders

## 2021-01-27 LAB — VARICELLA ZOSTER ABS, IGG/IGM
Varicella IgM: <0.91 index (ref 0.00–0.90)
Varicella zoster IgG: 343 index (ref >165)

## 2021-01-27 LAB — CBC
Hematocrit: 42.9 % (ref 37.5–51.0)
Hemoglobin: 14.1 g/dL (ref 13.0–17.7)
MCH: 29.3 pg (ref 26.6–33.0)
MCHC: 32.9 g/dL (ref 31.5–35.7)
MCV: 89 fL (ref 79–97)
Platelets: 218 10*3/uL (ref 150–450)
RBC: 4.81 x10E6/uL (ref 4.14–5.80)
RDW: 11.8 % (ref 11.6–15.4)
WBC: 5.4 10*3/uL (ref 3.4–10.8)

## 2021-01-27 LAB — CMP14+EGFR
ALT: 54 IU/L — ABNORMAL HIGH (ref 0–44)
AST: 45 IU/L — ABNORMAL HIGH (ref 0–40)
Albumin/Globulin Ratio: 1.6 (ref 1.2–2.2)
Albumin: 4.7 g/dL (ref 3.8–4.9)
Alkaline Phosphatase: 78 IU/L (ref 44–121)
BUN/Creatinine Ratio: 6 — ABNORMAL LOW (ref 9–20)
BUN: 14 mg/dL (ref 6–24)
Bilirubin Total: 0.5 mg/dL (ref 0.0–1.2)
CO2: 24 mmol/L (ref 20–29)
Calcium: 9.6 mg/dL (ref 8.7–10.2)
Chloride: 102 mmol/L (ref 96–106)
Creatinine, Ser: 2.25 mg/dL — ABNORMAL HIGH (ref 0.76–1.27)
Globulin, Total: 2.9 g/dL (ref 1.5–4.5)
Glucose: 86 mg/dL (ref 70–99)
Potassium: 4.6 mmol/L (ref 3.5–5.2)
Sodium: 140 mmol/L (ref 134–144)
Total Protein: 7.6 g/dL (ref 6.0–8.5)
eGFR: 34 mL/min/{1.73_m2} — ABNORMAL LOW (ref >59)

## 2021-01-27 LAB — MICROALBUMIN / CREATININE URINE RATIO
Creatinine, Urine: 55.7 mg/dL
Microalb/Creat Ratio: 452 mg/g creat — ABNORMAL HIGH (ref 0–29)
Microalbumin, Urine: 251.7 ug/mL

## 2021-01-27 LAB — HEMOGLOBIN A1C
Est. average glucose Bld gHb Est-mCnc: 114 mg/dL
Hgb A1c MFr Bld: 5.6 % (ref 4.8–5.6)

## 2021-03-01 ENCOUNTER — Other Ambulatory Visit: Payer: Self-pay | Admitting: Nurse Practitioner

## 2021-03-01 DIAGNOSIS — N183 Chronic kidney disease, stage 3 unspecified: Secondary | ICD-10-CM

## 2021-03-01 DIAGNOSIS — I129 Hypertensive chronic kidney disease with stage 1 through stage 4 chronic kidney disease, or unspecified chronic kidney disease: Secondary | ICD-10-CM

## 2021-05-12 ENCOUNTER — Encounter: Payer: Self-pay | Admitting: Nurse Practitioner

## 2021-05-12 ENCOUNTER — Ambulatory Visit (INDEPENDENT_AMBULATORY_CARE_PROVIDER_SITE_OTHER): Payer: Self-pay | Admitting: Nurse Practitioner

## 2021-05-12 VITALS — BP 140/90 | HR 65 | Temp 98.4°F | Ht 68.0 in | Wt 238.0 lb

## 2021-05-12 DIAGNOSIS — Z0000 Encounter for general adult medical examination without abnormal findings: Secondary | ICD-10-CM

## 2021-05-12 DIAGNOSIS — E782 Mixed hyperlipidemia: Secondary | ICD-10-CM

## 2021-05-12 DIAGNOSIS — R058 Other specified cough: Secondary | ICD-10-CM

## 2021-05-12 DIAGNOSIS — I129 Hypertensive chronic kidney disease with stage 1 through stage 4 chronic kidney disease, or unspecified chronic kidney disease: Secondary | ICD-10-CM

## 2021-05-12 DIAGNOSIS — Z6836 Body mass index (BMI) 36.0-36.9, adult: Secondary | ICD-10-CM

## 2021-05-12 DIAGNOSIS — Z79899 Other long term (current) drug therapy: Secondary | ICD-10-CM

## 2021-05-12 DIAGNOSIS — N183 Chronic kidney disease, stage 3 unspecified: Secondary | ICD-10-CM

## 2021-05-12 DIAGNOSIS — Z125 Encounter for screening for malignant neoplasm of prostate: Secondary | ICD-10-CM

## 2021-05-12 MED ORDER — OMEPRAZOLE MAGNESIUM 20 MG PO TBEC: DELAYED_RELEASE_TABLET | ORAL | 1 refills | Status: DC

## 2021-05-12 MED ORDER — OLMESARTAN MEDOXOMIL 40 MG PO TABS: 40.0000 mg | ORAL_TABLET | Freq: Every day | ORAL | 1 refills | Status: DC

## 2021-05-12 MED ORDER — AMLODIPINE BESYLATE 10 MG PO TABS: 10.0000 mg | ORAL_TABLET | Freq: Every day | ORAL | 1 refills | Status: DC

## 2021-05-12 NOTE — Progress Notes (Signed)
I,Tianna Badgett,acting as a Education administrator for [employer], FNP.,have documented all relevant documentation on the behalf of Minette Brine, FNP,as directed by  Minette Brine, FNP while in the presence of Minette Brine, [place].  This visit occurred during the SARS-CoV-2 public health emergency.  Safety protocols were in place, including screening questions prior to the visit, additional usage of staff PPE, and extensive cleaning of exam room while observing appropriate contact time as indicated for disinfecting solutions.  Subjective:     Patient ID: Chase Walsh , male    DOB: June 18, 1970 , 51 y.o.   MRN: [id number]   Chief Complaint  Patient presents with   Annual Exam    HPI  Patient is here for HM. He has not been to any other provider.   Wt Readings from Last 3 Encounters: 05/12/21 : 238 lb (108 kg) 01/26/21 : 237 lb 9.6 oz (107.8 kg) 07/20/20 : 223 lb 6.4 oz (101.3 kg)    Hypertension This is a chronic problem. The current episode started more than 1 year ago. The problem is unchanged. The problem is controlled. Pertinent negatives include no anxiety, chest pain, headaches or palpitations. There are no associated agents to hypertension. Risk factors for coronary artery disease include obesity. Past treatments include calcium channel blockers and angiotensin blockers. The current treatment provides no improvement. There are no compliance problems.  Hypertensive end-organ damage includes kidney disease. There is no history of angina. There is no history of chronic renal disease.    Past Medical History:  Diagnosis Date   Diverticulosis    Gout    Hypertension    Lower GI bleed    Vitiligo      Family History  Problem Relation Age of Onset   Breast cancer Mother    Arthritis Mother    Cancer Paternal Uncle        maybe colon   Inflammatory bowel disease Neg Hx      Current Outpatient Medications:    fluocinonide (LIDEX) 0.05 % external solution, Apply 1 application topically as  needed., Disp: , Rfl:    omeprazole (PRILOSEC OTC) 20 MG tablet, Take one tablet daily x 2 weeks then as needed, Disp: 30 tablet, Rfl: 1   amLODipine (NORVASC) 10 MG tablet, Take 1 tablet (10 mg total) by mouth daily., Disp: 90 tablet, Rfl: 1   atorvastatin (LIPITOR) 10 MG tablet, Take 1 tablet (10 mg total) by mouth daily., Disp: 30 tablet, Rfl: 11   colchicine 0.6 MG tablet, Take 1 tablet (0.6 mg total) by mouth daily. (Patient not taking: Reported on 05/12/2021), Disp: 90 tablet, Rfl: 1   dapagliflozin propanediol (FARXIGA) 10 MG TABS tablet, Take 1 tablet (10 mg total) by mouth daily before breakfast., Disp: 90 tablet, Rfl: 1   olmesartan (BENICAR) 40 MG tablet, Take 1 tablet (40 mg total) by mouth daily., Disp: 90 tablet, Rfl: 1   Allergies  Allergen Reactions   Allopurinol Rash     Men's preventive visit. Patient Health Questionnaire (PHQ-2) is  [place] Office Visit from 05/12/2021 in [facility]  PHQ-2 Total Score 0      Patient is on a Regular diet, he has cut back on burgers. He has also cut back on Breads and pastas. Marital status: Married. Relevant history for alcohol use is:  Social History   Substance and Sexual Activity  Alcohol Use No  . Relevant history for tobacco use is:  Social History   Tobacco Use  Smoking Status Never  Smokeless Tobacco Never  .   Review of Systems  Constitutional: Negative.   HENT: Negative.    Eyes: Negative.   Respiratory: Negative.    Cardiovascular: Negative.  Negative for chest pain and palpitations.  Gastrointestinal: Negative.   Endocrine: Negative.   Genitourinary: Negative.   Musculoskeletal: Negative.   Skin: Negative.   Allergic/Immunologic: Negative.   Neurological: Negative.  Negative for headaches.  Hematological: Negative.   Psychiatric/Behavioral: Negative.      Today's Vitals   05/12/21 1536  BP: 140/90  Pulse: 65  Temp: 98.4 F (36.9 C)  Weight: 238 lb (108 kg)  Height: $Remove'5\' 8"'XwwihDq$   (1.727 m)  PainSc: 0-No pain   Body mass index is 36.19 kg/m.  Wt Readings from Last 3 Encounters:  05/12/21 238 lb (108 kg)  01/26/21 237 lb 9.6 oz (107.8 kg)  07/20/20 223 lb 6.4 oz (101.3 kg)    Objective:  Physical Exam Vitals reviewed.  Constitutional:      General: He is not in acute distress.    Appearance: Normal appearance. He is obese.  HENT:     Head: Normocephalic and atraumatic.     Right Ear: Tympanic membrane, ear canal and external ear normal. There is no impacted cerumen.     Left Ear: Tympanic membrane, ear canal and external ear normal. There is no impacted cerumen.     Nose: Nose normal.     Mouth/Throat:     Mouth: Mucous membranes are moist.  Eyes:     Extraocular Movements: Extraocular movements intact.     Conjunctiva/sclera: Conjunctivae normal.     Pupils: Pupils are equal, round, and reactive to light.  Cardiovascular:     Rate and Rhythm: Normal rate and regular rhythm.     Pulses: Normal pulses.     Heart sounds: Normal heart sounds. No murmur heard. Pulmonary:     Effort: Pulmonary effort is normal. No respiratory distress.     Breath sounds: Normal breath sounds. No wheezing.  Abdominal:     General: Abdomen is flat. Bowel sounds are normal. There is no distension.     Palpations: Abdomen is soft.  Genitourinary:    Prostate: Normal.     Rectum: Guaiac result negative (negative).  Musculoskeletal:        General: Normal range of motion.     Cervical back: Normal range of motion and neck supple.  Skin:    General: Skin is warm and dry.     Capillary Refill: Capillary refill takes less than 2 seconds.     Comments: Dry rash to back area upper more than lower  Neurological:     General: No focal deficit present.     Mental Status: He is alert and oriented to person, place, and time.     Cranial Nerves: No cranial nerve deficit.     Motor: No weakness.  Psychiatric:        Mood and Affect: Mood normal.        Behavior: Behavior normal.         Thought Content: Thought content normal.        Judgment: Judgment normal.        Assessment And Plan:    1. Encounter for general adult medical examination w/o abnormal findings  2. Benign hypertension with CKD (chronic kidney disease) stage III (HCC) Comments: He has been without his olmesartan, will restart medication. Blood pressure is elevated today. I have strongly encouraged him to f/u with Nephrology -  EKG 12-Lead - Microalbumin / Creatinine Urine Ratio - CMP14+EGFR - amLODipine (NORVASC) 10 MG tablet; Take 1 tablet (10 mg total) by mouth daily.  Dispense: 90 tablet; Refill: 1 - olmesartan (BENICAR) 40 MG tablet; Take 1 tablet (40 mg total) by mouth daily.  Dispense: 90 tablet; Refill: 1  3. Mixed hyperlipidemia Comments: Will consider starting on statin due to his risk - Lipid panel  4. Nocturnal cough Will check BNP especially since he has poorly controlled hypertension - omeprazole (PRILOSEC OTC) 20 MG tablet; Take one tablet daily x 2 weeks then as needed  Dispense: 30 tablet; Refill: 1 - Brain natriuretic peptide  5. Class 2 severe obesity due to excess calories with serious comorbidity and body mass index (BMI) of 36.0 to 36.9 in adult [facility]) He is encouraged to initially strive for BMI less than 30 to decrease cardiac risk. He is advised to exercise no less than 150 minutes per week.   6. Encounter for prostate cancer screening - PSA  7. Other long term (current) drug therapy - CBC      Patient was given opportunity to ask questions. Patient verbalized understanding of the plan and was able to repeat key elements of the plan. All questions were answered to their satisfaction.   Minette Brine, FNP   I, Minette Brine, FNP, have reviewed all documentation for this visit. The documentation on 05/12/21 for the exam, diagnosis, procedures, and orders are all accurate and complete.   THE PATIENT IS ENCOURAGED TO PRACTICE SOCIAL DISTANCING DUE TO THE COVID-19  PANDEMIC.

## 2021-05-12 NOTE — Patient Instructions (Signed)
Health Maintenance, Male Adopting a healthy lifestyle and getting preventive care are important in promoting health and wellness. Ask your health care provider about: The right schedule for you to have regular tests and exams. Things you can do on your own to prevent diseases and keep yourself healthy. What should I know about diet, weight, and exercise? Eat a healthy diet  Eat a diet that includes plenty of vegetables, fruits, low-fat dairy products, and lean protein. Do not eat a lot of foods that are high in solid fats, added sugars, or sodium. Maintain a healthy weight Body mass index (BMI) is a measurement that can be used to identify possible weight problems. It estimates body fat based on height and weight. Your health care provider can help determine your BMI and help you achieve or maintain a healthy weight. Get regular exercise Get regular exercise. This is one of the most important things you can do for your health. Most adults should: Exercise for at least 150 minutes each week. The exercise should increase your heart rate and make you sweat (moderate-intensity exercise). Do strengthening exercises at least twice a week. This is in addition to the moderate-intensity exercise. Spend less time sitting. Even light physical activity can be beneficial. Watch cholesterol and blood lipids Have your blood tested for lipids and cholesterol at 52 years of age, then have this test every 5 years. You may need to have your cholesterol levels checked more often if: Your lipid or cholesterol levels are high. You are older than 52 years of age. You are at high risk for heart disease. What should I know about cancer screening? Many types of cancers can be detected early and may often be prevented. Depending on your health history and family history, you may need to have cancer screening at various ages. This may include screening for: Colorectal cancer. Prostate cancer. Skin cancer. Lung  cancer. What should I know about heart disease, diabetes, and high blood pressure? Blood pressure and heart disease High blood pressure causes heart disease and increases the risk of stroke. This is more likely to develop in people who have high blood pressure readings or are overweight. Talk with your health care provider about your target blood pressure readings. Have your blood pressure checked: Every 3-5 years if you are 18-39 years of age. Every year if you are 40 years old or older. If you are between the ages of 65 and 75 and are a current or former smoker, ask your health care provider if you should have a one-time screening for abdominal aortic aneurysm (AAA). Diabetes Have regular diabetes screenings. This checks your fasting blood sugar level. Have the screening done: Once every three years after age 45 if you are at a normal weight and have a low risk for diabetes. More often and at a younger age if you are overweight or have a high risk for diabetes. What should I know about preventing infection? Hepatitis B If you have a higher risk for hepatitis B, you should be screened for this virus. Talk with your health care provider to find out if you are at risk for hepatitis B infection. Hepatitis C Blood testing is recommended for: Everyone born from 1945 through 1965. Anyone with known risk factors for hepatitis C. Sexually transmitted infections (STIs) You should be screened each year for STIs, including gonorrhea and chlamydia, if: You are sexually active and are younger than 52 years of age. You are older than 52 years of age and your   health care provider tells you that you are at risk for this type of infection. Your sexual activity has changed since you were last screened, and you are at increased risk for chlamydia or gonorrhea. Ask your health care provider if you are at risk. Ask your health care provider about whether you are at high risk for HIV. Your health care provider  may recommend a prescription medicine to help prevent HIV infection. If you choose to take medicine to prevent HIV, you should first get tested for HIV. You should then be tested every 3 months for as long as you are taking the medicine. Follow these instructions at home: Alcohol use Do not drink alcohol if your health care provider tells you not to drink. If you drink alcohol: Limit how much you have to 0-2 drinks a day. Know how much alcohol is in your drink. In the [place] one drink equals one 12 oz bottle of beer (355 mL), one 5 oz glass of wine (148 mL), or one 1 oz glass of hard liquor (44 mL). Lifestyle Do not use any products that contain nicotine or tobacco. These products include cigarettes, chewing tobacco, and vaping devices, such as e-cigarettes. If you need help quitting, ask your health care provider. Do not use street drugs. Do not share needles. Ask your health care provider for help if you need support or information about quitting drugs. General instructions Schedule regular health, dental, and eye exams. Stay current with your vaccines. Tell your health care provider if: You often feel depressed. You have ever been abused or do not feel safe at home. Summary Adopting a healthy lifestyle and getting preventive care are important in promoting health and wellness. Follow your health care provider's instructions about healthy diet, exercising, and getting tested or screened for diseases. Follow your health care provider's instructions on monitoring your cholesterol and blood pressure. This information is not intended to replace advice given to you by your health care provider. Make sure you discuss any questions you have with your health care provider. Document Revised: 05/10/2020 Document Reviewed: 05/10/2020 Elsevier Patient Education  2023 Elsevier Inc.  

## 2021-05-13 ENCOUNTER — Telehealth: Payer: Self-pay

## 2021-05-13 LAB — CMP14+EGFR
ALT: 53 IU/L — ABNORMAL HIGH (ref 0–44)
AST: 46 IU/L — ABNORMAL HIGH (ref 0–40)
Albumin/Globulin Ratio: 1.3 (ref 1.2–2.2)
Albumin: 4.3 g/dL (ref 3.8–4.9)
Alkaline Phosphatase: 81 IU/L (ref 44–121)
BUN/Creatinine Ratio: 9 (ref 9–20)
BUN: 21 mg/dL (ref 6–24)
Bilirubin Total: 0.6 mg/dL (ref 0.0–1.2)
CO2: 22 mmol/L (ref 20–29)
Calcium: 9.7 mg/dL (ref 8.7–10.2)
Chloride: 102 mmol/L (ref 96–106)
Creatinine, Ser: 2.34 mg/dL — ABNORMAL HIGH (ref 0.76–1.27)
Globulin, Total: 3.3 g/dL (ref 1.5–4.5)
Glucose: 83 mg/dL (ref 70–99)
Potassium: 4.6 mmol/L (ref 3.5–5.2)
Sodium: 140 mmol/L (ref 134–144)
Total Protein: 7.6 g/dL (ref 6.0–8.5)
eGFR: 33 mL/min/{1.73_m2} — ABNORMAL LOW (ref >59)

## 2021-05-13 LAB — CBC
Hematocrit: 41.7 % (ref 37.5–51.0)
Hemoglobin: 13.9 g/dL (ref 13.0–17.7)
MCH: 29 pg (ref 26.6–33.0)
MCHC: 33.3 g/dL (ref 31.5–35.7)
MCV: 87 fL (ref 79–97)
Platelets: 288 10*3/uL (ref 150–450)
RBC: 4.79 x10E6/uL (ref 4.14–5.80)
RDW: 11.7 % (ref 11.6–15.4)
WBC: 7.1 10*3/uL (ref 3.4–10.8)

## 2021-05-13 LAB — PSA: Prostate Specific Ag, Serum: 0.9 ng/mL (ref 0.0–4.0)

## 2021-05-13 LAB — MICROALBUMIN / CREATININE URINE RATIO
Creatinine, Urine: 114.9 mg/dL
Microalb/Creat Ratio: 1254 mg/g creat — ABNORMAL HIGH (ref 0–29)
Microalbumin, Urine: 1441.4 ug/mL

## 2021-05-13 LAB — BRAIN NATRIURETIC PEPTIDE: BNP: 3.8 pg/mL (ref 0.0–100.0)

## 2021-05-13 LAB — LIPID PANEL
Chol/HDL Ratio: 9 ratio — ABNORMAL HIGH (ref 0.0–5.0)
Cholesterol, Total: 270 mg/dL — ABNORMAL HIGH (ref 100–199)
HDL: 30 mg/dL — ABNORMAL LOW (ref >39)
LDL Chol Calc (NIH): 187 mg/dL — ABNORMAL HIGH (ref 0–99)
Triglycerides: 269 mg/dL — ABNORMAL HIGH (ref 0–149)
VLDL Cholesterol Cal: 53 mg/dL — ABNORMAL HIGH (ref 5–40)

## 2021-05-13 NOTE — Telephone Encounter (Signed)
  CARE PLAN ENTRY (see longitudinal plan of care for additional care plan information)  Current Barriers:  Financial Barriers: patient has no insurance and reports copay for [name] is cost prohibitive at this time  Pharmacist Clinical Goal(s):  Over the next 30 days, patient will work with PharmD and providers to relieve medication access concerns  Interventions: Comprehensive medication review completed; medication list updated in electronic medical record.  Inter-disciplinary care team collaboration (see longitudinal plan of care) Patient meets income/out of pocket spend criteria for this medication's patient assistance program. Reviewed application process. Patient will provide proof of income, out of pocket spend report, and will sign application. Will collaborate with primary care provider [name] for their portion of application. Once completed, will submit to [employer] patient assistance program. Patient will need to sign paper work and provide income eligibility documentation.   Patient Self Care Activities:  Patient will provide necessary portions of application   Pharmacist Follow Up: I spoke with Chase Walsh and he agreed with this plan. Patient to bring in income documentation and sign paper work next week in office at 8:30 AM.   Cherylin Mylar, CPP, PharmD Clinical Pharmacist Practitioner [facility] [phone]

## 2021-05-16 ENCOUNTER — Encounter: Payer: Self-pay | Admitting: Nurse Practitioner

## 2021-05-24 ENCOUNTER — Other Ambulatory Visit: Payer: Self-pay

## 2021-05-24 ENCOUNTER — Ambulatory Visit: Payer: Self-pay

## 2021-05-24 ENCOUNTER — Other Ambulatory Visit: Payer: Self-pay | Admitting: Nurse Practitioner

## 2021-05-24 DIAGNOSIS — E782 Mixed hyperlipidemia: Secondary | ICD-10-CM

## 2021-05-24 MED ORDER — ATORVASTATIN CALCIUM 10 MG PO TABS: 10.0000 mg | ORAL_TABLET | Freq: Every day | ORAL | 11 refills | Status: DC

## 2021-05-24 MED ORDER — DAPAGLIFLOZIN PROPANEDIOL 10 MG PO TABS: 10.0000 mg | ORAL_TABLET | Freq: Every day | ORAL | 1 refills | Status: DC

## 2021-05-24 NOTE — Progress Notes (Signed)
Non CCM Patient Provider Consultation  Pharmacy Note  05/24/2021 Name:  Chase Walsh MRN:  [id number] DOB:  [birth year]  Summary: PCP guided referral for patient assistance for patient who can not afford current medication regimen due to lack of insurance.   Recommendations/Changes made from today's visit: Recommend patient be started on statin medication, Atorvastatin 10 mg tablet once per day.  Complete patient assistance application for Chase Walsh and Chase Walsh because of financial concerns and approval process.   Plan: Patient to start Atorvastatin 10 mg tablet once per day, would prefer for patient to have labs completed with in 8 weeks of starting of medication regimen, however, due to financial concerns at this point would suggest patient coming for labs during next office visit.  Patient came in today to complete application process for patient assistance programs, he brought his tax forms for 2022.  Collaborated with PCP team to start patient on Atorvastatin 10 mg tablet once per day to be sent to [facility] pharmacy due to medication cost.    Subjective: Chase Walsh is an 51 y.o. year old male who is a primary patient of Chase Felts, FNP.  The CCM team was consulted for assistance with disease management and care coordination needs.    Engaged with patient face to face for  patient assistance application process  in response to provider referral for pharmacy case management  Consent to Services:  Patient understands that he does not qualify for CCM services this is being completed for him due to his lack of insurance.   Patient Care Team: Chase Felts, FNP as PCP - General (General Practice)  Recent office visits: 05/12/2021 PCP OV  Recent consult visits: None noted, patient was to be seen by specialist,   Hospital visits: None in previous 6 months   Objective:  Lab Results  Component Value Date   CREATININE 2.34 (H) 05/12/2021   BUN 21 05/12/2021   EGFR 33  (L) 05/12/2021   GFRNONAA 41 (L) 02/04/2019   GFRAA 48 (L) 02/04/2019   NA 140 05/12/2021   K 4.6 05/12/2021   CALCIUM 9.7 05/12/2021   CO2 22 05/12/2021   GLUCOSE 83 05/12/2021    Lab Results  Component Value Date/Time   HGBA1C 5.6 01/26/2021 05:19 PM   HGBA1C 5.6 05/12/2020 11:42 AM   MICROALBUR 150 05/12/2020 02:06 PM   MICROALBUR 150 12/17/2017 12:18 PM    Last diabetic Eye exam: No results found for: HMDIABEYEEXA  Last diabetic Foot exam: No results found for: HMDIABFOOTEX   Lab Results  Component Value Date   CHOL 270 (H) 05/12/2021   HDL 30 (L) 05/12/2021   LDLCALC 187 (H) 05/12/2021   TRIG 269 (H) 05/12/2021   CHOLHDL 9.0 (H) 05/12/2021       Latest Ref Rng & Units 05/12/2021    4:48 PM 01/26/2021    5:19 PM 07/20/2020    4:56 PM  Hepatic Function  Total Protein 6.0 - 8.5 g/dL 7.6   7.6   7.5    Albumin 3.8 - 4.9 g/dL 4.3   4.7   4.6    AST 0 - 40 IU/L 46   45   20    ALT 0 - 44 IU/L 53   54   20    Alk Phosphatase 44 - 121 IU/L 81   78   104    Total Bilirubin 0.0 - 1.2 mg/dL 0.6   0.5   0.8      No results  found for: TSH, FREET4     Latest Ref Rng & Units 05/12/2021    4:48 PM 01/26/2021    5:19 PM 05/12/2020   11:42 AM  CBC  WBC 3.4 - 10.8 x10E3/uL 7.1   5.4   5.9    Hemoglobin 13.0 - 17.7 g/dL 13.9   14.1   15.0    Hematocrit 37.5 - 51.0 % 41.7   42.9   45.1    Platelets 150 - 450 x10E3/uL 288   218   250      No results found for: VD25OH  Clinical ASCVD: No  The 10-year ASCVD risk score (Arnett DK, et al., 2019) is: 14.3%   Values used to calculate the score:     Age: 63 years     Sex: Male     Is Non-Hispanic African American: Yes     Diabetic: No     Tobacco smoker: No     Systolic Blood Pressure: 644 mmHg     Is BP treated: Yes     HDL Cholesterol: 30 mg/dL     Total Cholesterol: 270 mg/dL       05/12/2021    3:36 PM 05/12/2020   11:15 AM 02/04/2019    3:40 PM  Depression screen PHQ 2/9  Decreased Interest 0 0 0  Down, Depressed,  Hopeless 0 0 0  PHQ - 2 Score 0 0 0     Social History   Tobacco Use  Smoking Status Never  Smokeless Tobacco Never   BP Readings from Last 3 Encounters:  05/12/21 140/90  01/26/21 130/70  07/20/20 (!) 140/96   Pulse Readings from Last 3 Encounters:  05/12/21 65  01/26/21 73  07/20/20 88   Wt Readings from Last 3 Encounters:  05/12/21 238 lb (108 kg)  01/26/21 237 lb 9.6 oz (107.8 kg)  07/20/20 223 lb 6.4 oz (101.3 kg)   BMI Readings from Last 3 Encounters:  05/12/21 36.19 kg/m  01/26/21 36.13 kg/m  07/20/20 33.97 kg/m    Assessment/Interventions: Review of patient past medical history, allergies, medications, health status, including review of consultants reports, laboratory and other test data, was performed as part of comprehensive evaluation and provision of chronic care management services.   SDOH:  (Social Determinants of Health) assessments and interventions performed: Yes  SDOH Screenings   Alcohol Screen: Not on file  Depression (PHQ2-9): Low Risk    PHQ-2 Score: 0  Financial Resource Strain: Not on file  Food Insecurity: Not on file  Housing: Not on file  Physical Activity: Not on file  Social Connections: Not on file  Stress: Not on file  Tobacco Use: Low Risk    Smoking Tobacco Use: Never   Smokeless Tobacco Use: Never   Passive Exposure: Not on file  Transportation Needs: Not on file    [place]  Allergies  Allergen Reactions   Allopurinol Rash    Medications Reviewed Today     Reviewed by Chase Brine, FNP (Family Nurse Practitioner) on 05/12/21 at [place] List Status: <None>   Medication Order Taking? Sig Documenting Provider Last Dose Status Informant  amLODipine (NORVASC) 10 MG tablet [id number] Yes Take 1 tablet (10 mg total) by mouth daily. Chase Brine, FNP Taking Active   colchicine 0.6 MG tablet [id number] No Take 1 tablet (0.6 mg total) by mouth daily.  Patient not taking: Reported on 05/12/2021   Chase Brine, FNP  Not Taking Active   fluocinonide (LIDEX) 0.05 % external  solution [id number] Yes Apply 1 application topically as needed. [provider] Taking Active   hydrocortisone cream 1 % [id number] Yes Apply to affected area 2 times daily Chase Brine, FNP Taking Active   olmesartan (BENICAR) 40 MG tablet [id number] Yes Take 1 tablet (40 mg total) by mouth daily. Bary Castilla, NP Taking Active             Patient Active Problem List   Diagnosis Date Noted   History of COVID-19 02/05/2019   Fall 11/21/2018   Stage 3 chronic kidney disease ([place]) 07/08/2018   Class 1 obesity due to excess calories with serious comorbidity and body mass index (BMI) of 33.0 to 33.9 in adult 07/08/2018   Exposure to COVID-19 virus 06/05/2018   Chronic gout of left knee 03/18/2018   Diverticulosis of colon with hemorrhage    Hematochezia 04/21/2016   Vitiligo    Essential hypertension     Immunization History  Administered Date(s) Administered   PFIZER(Purple Top)SARS-COV-2 Vaccination 08/13/2019, 09/03/2019    Conditions to be addressed/monitored:  Patient assistance, hyperlipidemia    -Patient assistance completed for patient. We thoroughly discussed the application process.   Medication Assistance: Application for Farxiga  medication assistance program. in process.  Anticipated assistance start date 07/2021.  See plan of care for additional detail.  Compliance/Adherence/Medication fill history: Care Gaps: Shingrix COVID-19 Vaccine   Star-Rating Drugs: Olmesartan 40 mg tablet   Patient's preferred pharmacy is:  [id number] - [place], [place] - [address] [place] [address] Phone: [phone] Fax: [phone]  [place]), [place] - [address] [place]) [place] [address] Phone: [phone] Fax: [phone]  Pt endorses 90% compliance  Care Plan and  Follow Up Patient Decision:  Patient to follow up with PCP team with any concerns .  Plan:  At this time patient will not have a regular follow up.   Orlando Penner, CPP, PharmD Clinical Pharmacist Practitioner [facility] [phone]

## 2021-06-01 ENCOUNTER — Telehealth: Payer: Self-pay

## 2021-06-01 NOTE — Chronic Care Management (AMB) (Signed)
Non-CCM patient:  Faxed patient assistance applications to [facility] for [employer] for [employer]  [name], CMA Clinical Pharmacist Assistant [phone]

## 2021-06-14 ENCOUNTER — Telehealth: Payer: Self-pay

## 2021-06-14 NOTE — Chronic Care Management (AMB) (Signed)
Non CCM patient:  AZ&Me notification:  Patient denied patient assistance enrollment for AZ&Me for Wilder Glade, per fax, Coverage may be available through patients insurance. Appeal request form attach, will check with providers if they want to move forward with appeal.   [employer] Cares notification:  Patient approved in the [place] patient assistance program starting May 31,2023 - Jun 01, 2022 for Jardiance. When new supply is needed patient can either reorder through contacting [employer] cares at [phone] or completing order form included in shipment of medication and mail to [facility] cares or fax to [phone].   Pattricia Boss, [place] Pharmacist Assistant [phone]

## 2021-07-03 DIAGNOSIS — I10 Essential (primary) hypertension: Secondary | ICD-10-CM | POA: Insufficient documentation

## 2021-08-12 ENCOUNTER — Other Ambulatory Visit: Payer: Self-pay | Admitting: Nephrology

## 2021-08-12 DIAGNOSIS — M109 Gout, unspecified: Secondary | ICD-10-CM

## 2021-08-12 DIAGNOSIS — N1832 Chronic kidney disease, stage 3b: Secondary | ICD-10-CM

## 2021-08-12 DIAGNOSIS — R3589 Other polyuria: Secondary | ICD-10-CM

## 2021-08-12 DIAGNOSIS — I129 Hypertensive chronic kidney disease with stage 1 through stage 4 chronic kidney disease, or unspecified chronic kidney disease: Secondary | ICD-10-CM

## 2021-08-17 ENCOUNTER — Telehealth: Payer: Self-pay

## 2021-08-18 NOTE — Chronic Care Management (AMB) (Signed)
Faxed appeal form to [employer] for reconsideration on Farxiga patient assistance. Patient does not have any medical insurance.  [name], CMA Clinical Pharmacist Assistant [phone]

## 2021-09-01 IMAGING — CT CT ABD-PELV W/O CM
2 of 4 series · 16 of 46 positions shown, 18 images · non-contrast
Comparison: 04/21/2016

CLINICAL DATA: Nausea and vomiting. Left upper quadrant pain.

EXAM:
CT ABDOMEN AND PELVIS WITHOUT CONTRAST
TECHNIQUE: Multidetector CT imaging of the abdomen and pelvis was performed
following the standard protocol without IV contrast.

[Series 3: a/p w/o 5mm · axial · non-contrast · 0.79mm/px · z∈[+792,+1217]mm · 13 of 93 slices shown, 15 images]
[im 4/93  soft-tissue]
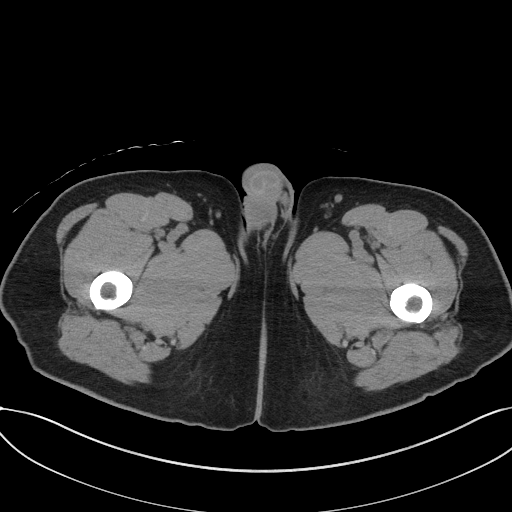
[im 4/93  bone]
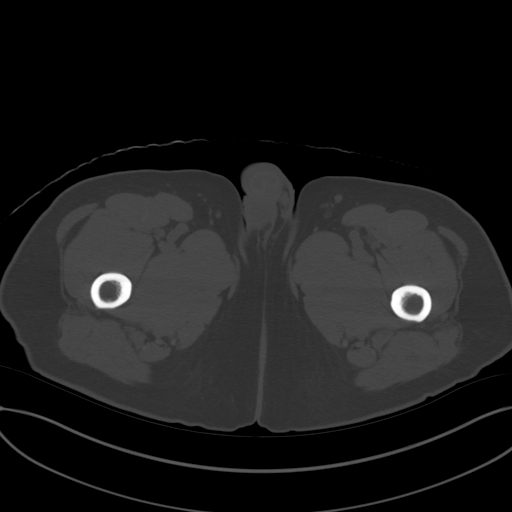
[im 12/93  soft-tissue]
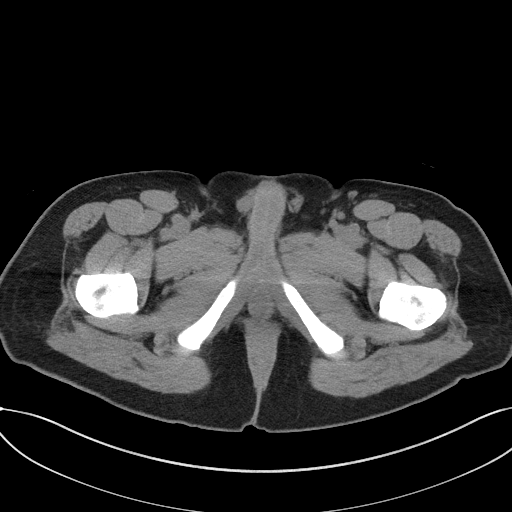
[im 19/93  soft-tissue]
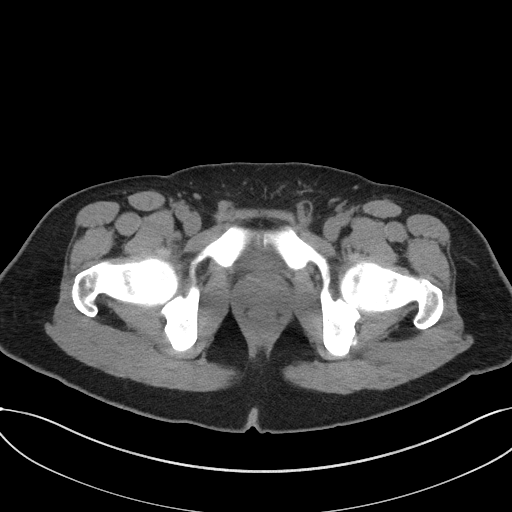
[im 26/93  soft-tissue]
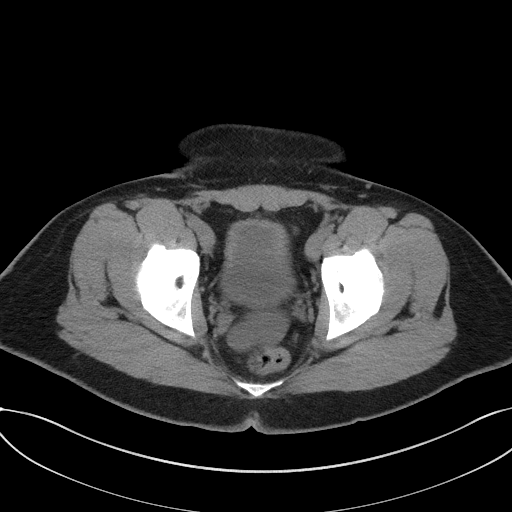
[im 34/93  soft-tissue]
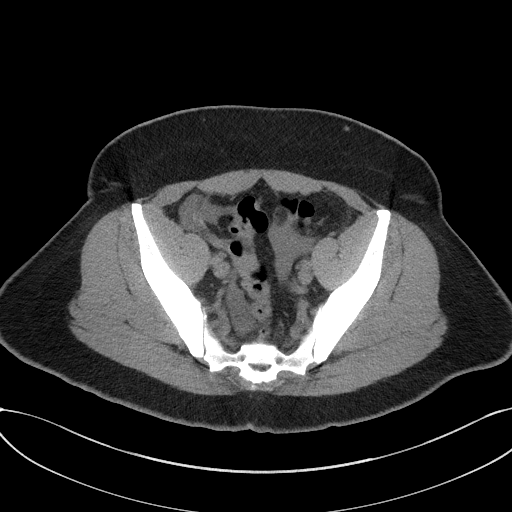
[im 41/93  soft-tissue]
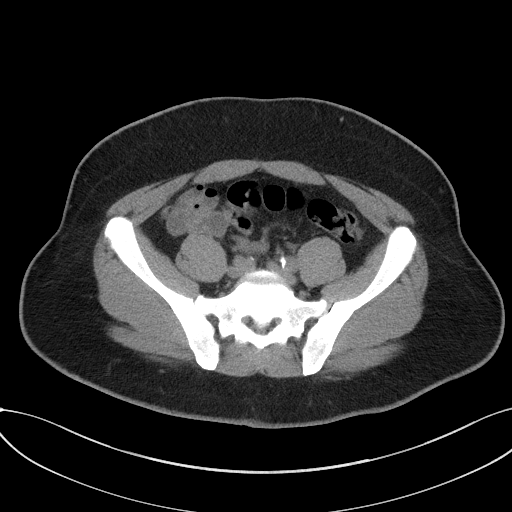
[im 48/93  soft-tissue]
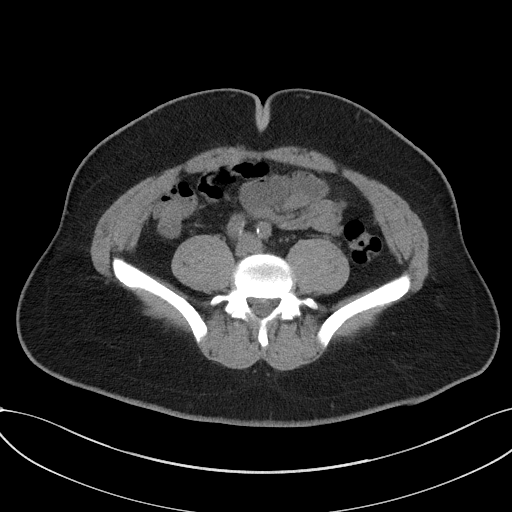
[im 52/93  soft-tissue]
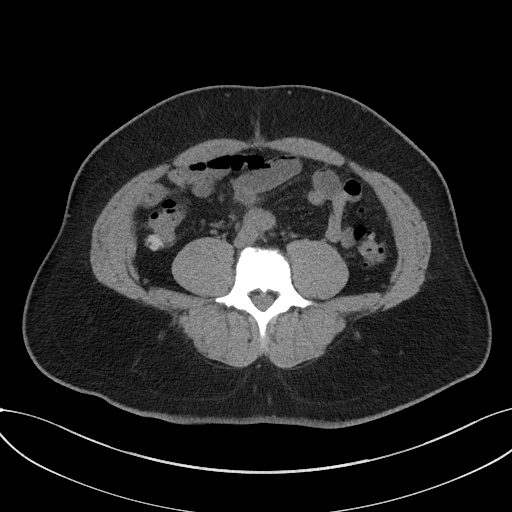
[im 59/93  soft-tissue]
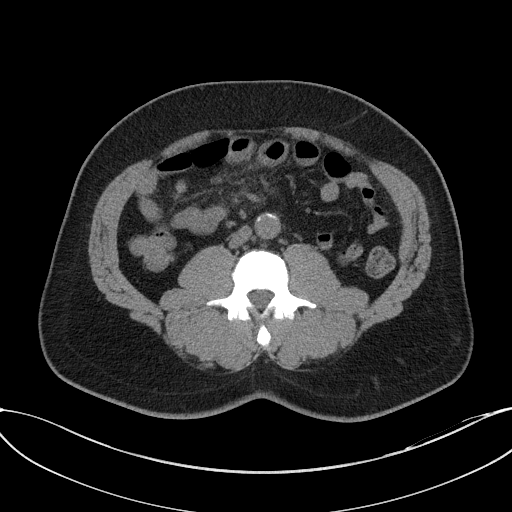
[im 59/93  bone]
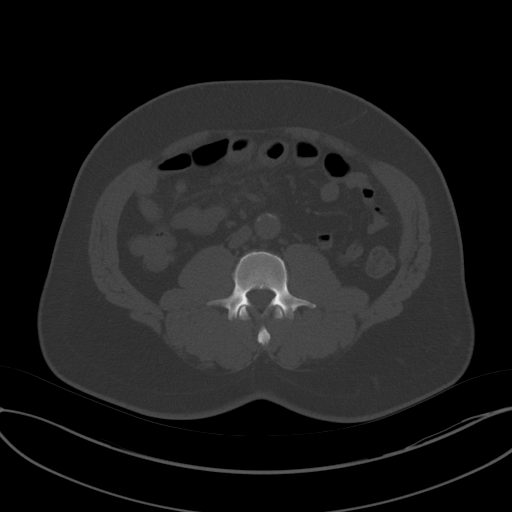
[im 67/93  soft-tissue]
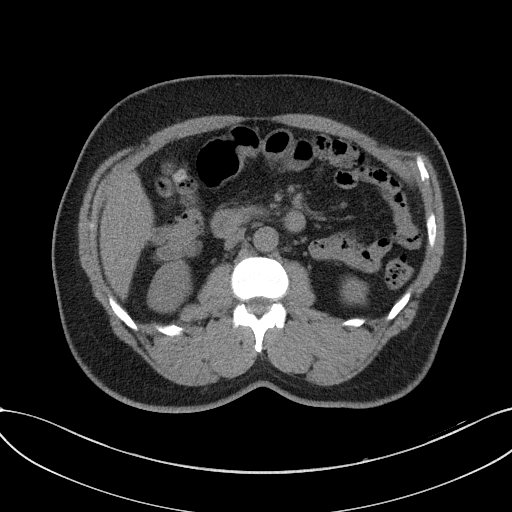
[im 74/93  soft-tissue]
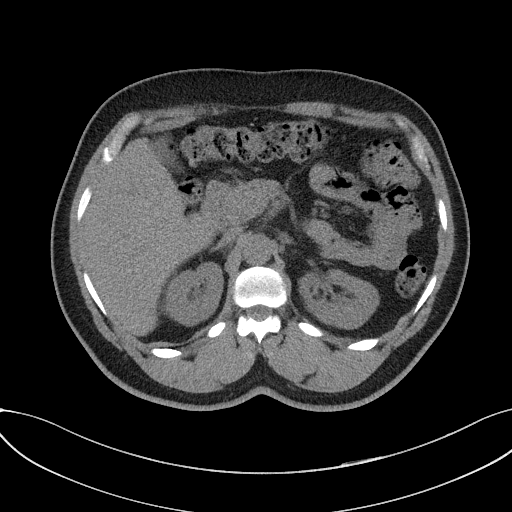
[im 81/93  soft-tissue]
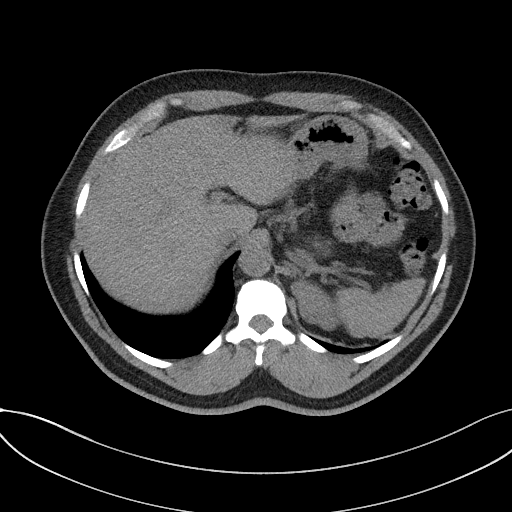
[im 89/93  soft-tissue]
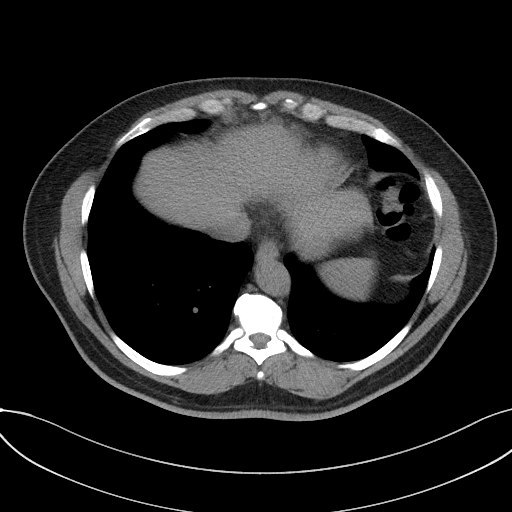

[Series 6: a/p w/o cor · coronal · non-contrast · 0.90mm/px · 3 of 154 slices shown]
[im 52/154  soft-tissue]
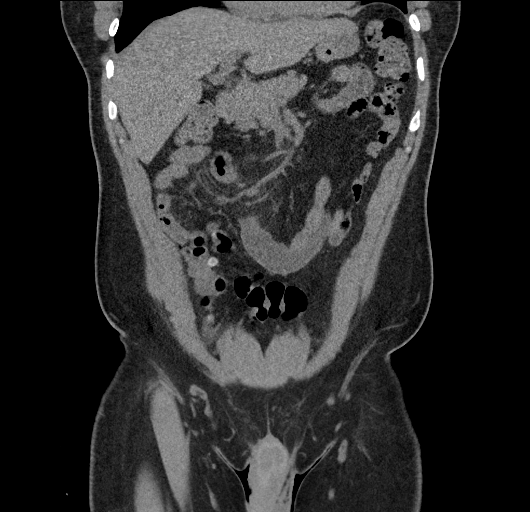
[im 69/154  soft-tissue]
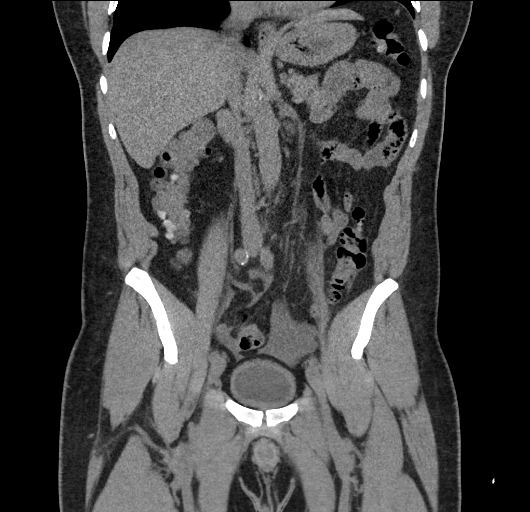
[im 86/154  soft-tissue]
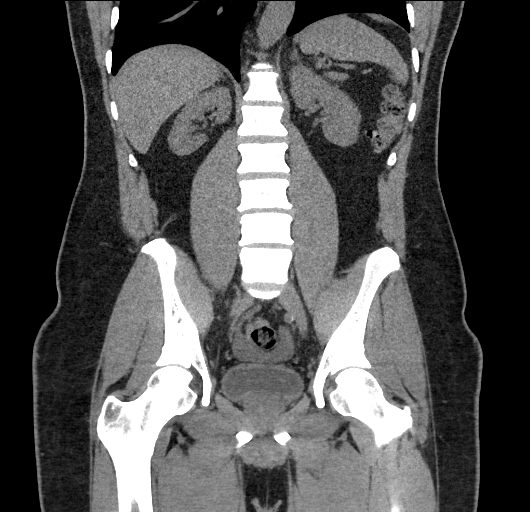

[16 of 46 positions shown; findings below may reference images not displayed]

FINDINGS: Lower chest: Lung bases are clear.

Hepatobiliary: Mild hepatic steatosis. Previous low-density lesion
in the right lobe of the liver is not well-defined in the absence of
IV contrast. Gallbladder physiologically distended, no calcified
stone. No biliary dilatation.

Pancreas: No ductal dilatation or inflammation.

Spleen: Normal in size without focal abnormality.

Adrenals/Urinary Tract: Normal adrenal glands. No hydronephrosis. No
perinephric edema. No renal stones. Both ureters are decompressed
without stones along the course. Urinary bladder is partially
distended. Mild bladder wall thickening about the dome.

Stomach/Bowel: Bowel evaluation is limited in the absence of enteric
contrast. Stomach is nondistended. Duodenum and proximal small bowel
are unremarkable. There are few fluid-filled prominent small bowel
loops in the lower abdomen and pelvis, nonobstructive pattern.
Normal appendix, series 3, image 52. Colonic diverticulosis without
focal diverticulitis. Small volume of colonic stool.

Vascular/Lymphatic: Aorta bi-iliac atherosclerosis. No aneurysm. No
enlarged lymph nodes in the abdomen or pelvis.

Reproductive: Prostate is unremarkable.

Other: Small to moderate free fluid in the lower abdomen and pelvis,
simple fluid density. No free air.

Musculoskeletal: Chronic sclerosis about the right sacroiliac joint.
There are no acute or suspicious osseous abnormalities.
IMPRESSION: 1. Suggestion of mild enteritis with few fluid-filled prominent
small bowel loops in the central abdomen.
2. Small to moderate free fluid in the lower abdomen and pelvis,
simple fluid density. Underlying etiology is uncertain, likely
reactive.
3. Mild bladder wall thickening about the dome, recommend
correlation with urinalysis to exclude urinary tract infection.
4. Colonic diverticulosis without diverticulitis.
5. Hepatic steatosis. Previous low-density lesion in the right lobe
of the liver is not well-defined in the absence of IV contrast.

Aortic Atherosclerosis ([id number]).

## 2021-09-06 ENCOUNTER — Telehealth: Payer: Self-pay

## 2021-09-06 NOTE — Chronic Care Management (AMB) (Signed)
AZ&Me Notification:  Patient approved to receive Farxiga 10 mg with [employer] patient assistance program, starting 08/22/2021 ending 08/23/2022. [name]  was shipped on 08/29/2021, allow 1-2 business days for shipment to arrive. Shipment was sent via [employer], Tracking number [id number].   [name], CMA Clinical Pharmacist Assistant [phone]

## 2021-09-15 ENCOUNTER — Encounter: Payer: Self-pay | Admitting: Nurse Practitioner

## 2021-09-15 ENCOUNTER — Ambulatory Visit (INDEPENDENT_AMBULATORY_CARE_PROVIDER_SITE_OTHER): Payer: Self-pay | Admitting: Nurse Practitioner

## 2021-09-15 VITALS — BP 130/78 | HR 66 | Temp 98.4°F | Ht 68.0 in | Wt 232.0 lb

## 2021-09-15 DIAGNOSIS — N183 Chronic kidney disease, stage 3 unspecified: Secondary | ICD-10-CM

## 2021-09-15 DIAGNOSIS — E782 Mixed hyperlipidemia: Secondary | ICD-10-CM

## 2021-09-15 DIAGNOSIS — I129 Hypertensive chronic kidney disease with stage 1 through stage 4 chronic kidney disease, or unspecified chronic kidney disease: Secondary | ICD-10-CM

## 2021-09-15 NOTE — Progress Notes (Signed)
I,Tianna Badgett,acting as a Neurosurgeon for [employer], FNP.,have documented all relevant documentation on the behalf of [name], FNP,as directed by  [name], FNP while in the presence of [name], FNP.  Subjective:     Patient ID: Chase Walsh , male    DOB: Oct 23, 1970 , 51 y.o.   MRN: [id number]   Chief Complaint  Patient presents with   Hypertension    HPI  Patient is here for HTN follow up. Patient would like to discuss when he should switch to [place] from [place] since approved with PAP. He reports when taking the jardiance he had side effects of joint pain. He has changed his eating habits.    Wt Readings from Last 3 Encounters: 09/15/21 : 232 lb (105.2 kg) 05/12/21 : 238 lb (108 kg) 01/26/21 : 237 lb 9.6 oz (107.8 kg)  He plans to get insurance with his job when open enrollment  Hypertension This is a chronic problem. The current episode started more than 1 year ago. The problem is unchanged. The problem is controlled. Pertinent negatives include no anxiety, chest pain, headaches or palpitations. There are no associated agents to hypertension. Risk factors for coronary artery disease include obesity. Past treatments include calcium channel blockers and angiotensin blockers. The current treatment provides no improvement. There are no compliance problems.  There is no history of angina. There is no history of chronic renal disease.     Past Medical History:  Diagnosis Date   Diverticulosis    Gout    Hypertension    Lower GI bleed    Vitiligo      Family History  Problem Relation Age of Onset   Breast cancer Mother    Arthritis Mother    Cancer Paternal Uncle        maybe colon   Inflammatory bowel disease Neg Hx      Current Outpatient Medications:    amLODipine (NORVASC) 10 MG tablet, Take 1 tablet (10 mg total) by mouth daily., Disp: 90 tablet, Rfl: 1   atorvastatin (LIPITOR) 10 MG tablet, Take 1 tablet (10 mg total) by mouth daily., Disp: 30  tablet, Rfl: 11   colchicine 0.6 MG tablet, Take 1 tablet (0.6 mg total) by mouth daily. (Patient not taking: Reported on 05/12/2021), Disp: 90 tablet, Rfl: 1   dapagliflozin propanediol (FARXIGA) 10 MG TABS tablet, Take 1 tablet (10 mg total) by mouth daily before breakfast., Disp: 90 tablet, Rfl: 1   fluocinonide (LIDEX) 0.05 % external solution, Apply 1 application topically as needed., Disp: , Rfl:    olmesartan (BENICAR) 40 MG tablet, Take 1 tablet (40 mg total) by mouth daily., Disp: 90 tablet, Rfl: 1   omeprazole (PRILOSEC OTC) 20 MG tablet, Take one tablet daily x 2 weeks then as needed, Disp: 30 tablet, Rfl: 1   Allergies  Allergen Reactions   Allopurinol Rash     Review of Systems  Constitutional: Negative.   Respiratory: Negative.    Cardiovascular: Negative.  Negative for chest pain and palpitations.  Gastrointestinal: Negative.   Neurological: Negative.  Negative for headaches.  Psychiatric/Behavioral: Negative.       Today's Vitals   09/15/21 0923  BP: 130/78  Pulse: 66  Temp: 98.4 F (36.9 C)  TempSrc: Oral  Weight: 232 lb (105.2 kg)  Height: 5\' 8"  (1.727 m)   Body mass index is 35.28 kg/m.   Objective:  Physical Exam Vitals reviewed.  Constitutional:      General: He is not in  acute distress.    Appearance: Normal appearance. He is obese.  Cardiovascular:     Rate and Rhythm: Normal rate and regular rhythm.     Pulses: Normal pulses.     Heart sounds: Normal heart sounds. No murmur heard. Pulmonary:     Effort: Pulmonary effort is normal. No respiratory distress.     Breath sounds: Normal breath sounds.  Skin:    General: Skin is warm and dry.  Neurological:     General: No focal deficit present.     Mental Status: He is alert.         Assessment And Plan:     1. Benign hypertension with CKD (chronic kidney disease) stage III (HCC) Comments: Blood pressure is better controlled. Continue current medications, advised taking the [name] may help  his blood pressure as well. Continue f/u with Nephrology  2. Mixed hyperlipidemia Comments: Stable, continue current medications     Patient was given opportunity to ask questions. Patient verbalized understanding of the plan and was able to repeat key elements of the plan. All questions were answered to their satisfaction.  [name], FNP   I, [name], FNP, have reviewed all documentation for this visit. The documentation on 09/15/21 for the exam, diagnosis, procedures, and orders are all accurate and complete.   IF YOU HAVE BEEN REFERRED TO A SPECIALIST, IT MAY TAKE 1-2 WEEKS TO SCHEDULE/PROCESS THE REFERRAL. IF YOU HAVE NOT HEARD FROM US/SPECIALIST IN TWO WEEKS, PLEASE GIVE [place] A CALL AT [phone].   THE PATIENT IS ENCOURAGED TO PRACTICE SOCIAL DISTANCING DUE TO THE COVID-19 PANDEMIC.

## 2021-09-15 NOTE — Patient Instructions (Signed)

## 2021-09-29 ENCOUNTER — Encounter: Payer: Self-pay | Admitting: Nurse Practitioner

## 2021-11-02 ENCOUNTER — Ambulatory Visit
Admission: RE | Admit: 2021-11-02 | Discharge: 2021-11-02 | Disposition: A | Discharge status: Home / Self Care | Payer: No Typology Code available for payment source | Source: Ambulatory Visit | Attending: Nephrology | Admitting: Nephrology

## 2021-11-02 DIAGNOSIS — R3589 Other polyuria: Secondary | ICD-10-CM

## 2021-11-02 DIAGNOSIS — M109 Gout, unspecified: Secondary | ICD-10-CM

## 2021-11-02 DIAGNOSIS — I129 Hypertensive chronic kidney disease with stage 1 through stage 4 chronic kidney disease, or unspecified chronic kidney disease: Secondary | ICD-10-CM

## 2021-11-02 DIAGNOSIS — N1832 Chronic kidney disease, stage 3b: Secondary | ICD-10-CM

## 2021-12-19 ENCOUNTER — Encounter: Payer: Self-pay | Admitting: Nurse Practitioner

## 2021-12-19 ENCOUNTER — Ambulatory Visit (INDEPENDENT_AMBULATORY_CARE_PROVIDER_SITE_OTHER): Payer: Self-pay | Admitting: Nurse Practitioner

## 2021-12-19 VITALS — BP 138/80 | HR 70 | Temp 98.4°F | Ht 68.0 in | Wt 233.0 lb

## 2021-12-19 DIAGNOSIS — M10442 Other secondary gout, left hand: Secondary | ICD-10-CM

## 2021-12-19 MED ORDER — PREDNISONE 10 MG (21) PO TBPK: ORAL_TABLET | ORAL | 0 refills | Status: DC

## 2021-12-19 NOTE — Progress Notes (Signed)
I,Tianna Badgett,acting as a Neurosurgeon for [employer], FNP.,have documented all relevant documentation on the behalf of [name], FNP,as directed by  [name], FNP while in the presence of [name], FNP.  Subjective:     Patient ID: Chase Walsh , male    DOB: 1970-05-23 , 51 y.o.   MRN: [id number]   Chief Complaint  Patient presents with   Gout    HPI  Here for gout flare. Left hand swelling since Wednesday he is unsure of what may have flared the gout. He has been taking tylenol 650mg . He is not taking colchicine due to not effective.      Past Medical History:  Diagnosis Date   Diverticulosis    Gout    Hypertension    Lower GI bleed    Vitiligo      Family History  Problem Relation Age of Onset   Breast cancer Mother    Arthritis Mother    Cancer Paternal Uncle        maybe colon   Inflammatory bowel disease Neg Hx      Current Outpatient Medications:    predniSONE (STERAPRED UNI-PAK 21 TAB) 10 MG (21) TBPK tablet, Take as directed, Disp: 21 tablet, Rfl: 0   amLODipine (NORVASC) 10 MG tablet, Take 1 tablet (10 mg total) by mouth daily., Disp: 90 tablet, Rfl: 1   atorvastatin (LIPITOR) 10 MG tablet, Take 1 tablet (10 mg total) by mouth daily., Disp: 30 tablet, Rfl: 11   dapagliflozin propanediol (FARXIGA) 10 MG TABS tablet, Take 1 tablet (10 mg total) by mouth daily before breakfast., Disp: 90 tablet, Rfl: 1   fluocinonide (LIDEX) 0.05 % external solution, Apply 1 application topically as needed., Disp: , Rfl:    olmesartan (BENICAR) 40 MG tablet, Take 1 tablet (40 mg total) by mouth daily., Disp: 90 tablet, Rfl: 1   omeprazole (PRILOSEC OTC) 20 MG tablet, Take one tablet daily x 2 weeks then as needed, Disp: 30 tablet, Rfl: 1   Allergies  Allergen Reactions   Allopurinol Rash     Review of Systems  Constitutional: Negative.   Respiratory: Negative.    Cardiovascular: Negative.   Gastrointestinal: Negative.   Musculoskeletal:  Positive for  arthralgias (left hand pain).  Neurological: Negative.      Today's Vitals   12/19/21 1508  BP: 138/80  Pulse: 70  Temp: 98.4 F (36.9 C)  TempSrc: Oral  Weight: 233 lb (105.7 kg)  Height: 5\' 8"  (1.727 m)   Body mass index is 35.43 kg/m.   Objective:  Physical Exam Vitals reviewed.  Constitutional:      General: He is not in acute distress.    Appearance: Normal appearance. He is obese.  Cardiovascular:     Rate and Rhythm: Normal rate and regular rhythm.     Pulses: Normal pulses.     Heart sounds: Normal heart sounds. No murmur heard. Pulmonary:     Effort: Pulmonary effort is normal. No respiratory distress.     Breath sounds: Normal breath sounds. No wheezing.  Musculoskeletal:        General: Swelling (left hand) and tenderness (left hand/fingers) present.     Right wrist: Normal. No tenderness.     Left wrist: Swelling and tenderness (posterior wrist) present.  Skin:    General: Skin is warm and dry.  Neurological:     General: No focal deficit present.     Mental Status: He is alert.     Cranial Nerves:  No cranial nerve deficit.     Motor: No weakness.         Assessment And Plan:     1. Other secondary acute gout of left hand Comments: Left hand is swollen, will treat with prednisone and encouraged to avoid triggers. - predniSONE (STERAPRED UNI-PAK 21 TAB) 10 MG (21) TBPK tablet; Take as directed  Dispense: 21 tablet; Refill: 0     Patient was given opportunity to ask questions. Patient verbalized understanding of the plan and was able to repeat key elements of the plan. All questions were answered to their satisfaction.  [name], FNP   I, [name], FNP, have reviewed all documentation for this visit. The documentation on 12/19/21 for the exam, diagnosis, procedures, and orders are all accurate and complete.   IF YOU HAVE BEEN REFERRED TO A SPECIALIST, IT MAY TAKE 1-2 WEEKS TO SCHEDULE/PROCESS THE REFERRAL. IF YOU HAVE NOT HEARD FROM  US/SPECIALIST IN TWO WEEKS, PLEASE GIVE [place] A CALL AT [phone].   THE PATIENT IS ENCOURAGED TO PRACTICE SOCIAL DISTANCING DUE TO THE COVID-19 PANDEMIC.

## 2021-12-19 NOTE — Patient Instructions (Signed)
Gout  Gout is painful swelling of your joints. Gout is a type of arthritis. It is caused by having too much uric acid in your body. Uric acid is a chemical that is made when your body breaks down substances called purines. If your body has too much uric acid, sharp crystals can form and build up in your joints. This causes pain and swelling. Gout attacks can happen quickly and be very painful (acute gout). Over time, the attacks can affect more joints and happen more often (chronic gout). What are the causes? Gout is caused by too much uric acid in your blood. This can happen because: Your kidneys do not remove enough uric acid from your blood. Your body makes too much uric acid. You eat too many foods that are high in purines. These foods include organ meats, some seafood, and beer. Trauma or stress can bring on an attack. What increases the risk? Having a family history of gout. Being male and middle-aged. Being male and having gone through menopause. Having an organ transplant. Taking certain medicines. Having certain conditions, such as: Being very overweight (obese). Lead poisoning. Kidney disease. A skin condition called psoriasis. Other risks include: Losing weight too quickly. Not having enough water in the body (being dehydrated). Drinking alcohol, especially beer. Drinking beverages that are sweetened with a type of sugar called fructose. What are the signs or symptoms? An attack of acute gout often starts at night and usually happens in just one joint. The most common place is the big toe. Other joints that may be affected include joints of the feet, ankle, knee, fingers, wrist, or elbow. Symptoms may include: Very bad pain. Warmth. Swelling. Stiffness. Tenderness. The affected joint may be very painful to touch. Shiny, red, or purple skin. Chills and fever. Chronic gout may cause symptoms more often. More joints may be involved. You may also have white or yellow lumps  (tophi) on your hands or feet or in other areas near your joints. How is this treated? Treatment for an acute attack may include medicines for pain and swelling, such as: NSAIDs, such as ibuprofen. Steroids taken by mouth or injected into a joint. Colchicine. This can be given by mouth or through an IV tube. Treatment to prevent future attacks may include: Taking small doses of NSAIDs or colchicine daily. Using a medicine that reduces uric acid levels in your blood, such as allopurinol. Making changes to your diet. You may need to see a food expert (dietitian) about what to eat and drink to prevent gout. Follow these instructions at home: During a gout attack  If told, put ice on the painful area. To do this: Put ice in a plastic bag. Place a towel between your skin and the bag. Leave the ice on for 20 minutes, 2-3 times a day. Take off the ice if your skin turns bright red. This is very important. If you cannot feel pain, heat, or cold, you have a greater risk of damage to the area. Raise the painful joint above the level of your heart as often as you can. Rest the joint as much as possible. If the joint is in your leg, you may be given crutches. Follow instructions from your doctor about what you cannot eat or drink. Avoiding future gout attacks Eat a low-purine diet. Avoid foods and drinks such as: Liver. Kidney. Anchovies. Asparagus. Herring. Mushrooms. Mussels. Beer. Stay at a healthy weight. If you want to lose weight, talk with your doctor. Do not   lose weight too fast. Start or continue an exercise plan as told by your doctor. Eating and drinking Avoid drinks sweetened by fructose. Drink enough fluids to keep your pee (urine) pale yellow. If you drink alcohol: Limit how much you have to: 0-1 drink a day for women who are not pregnant. 0-2 drinks a day for men. Know how much alcohol is in a drink. In the [place] one drink equals one 12 oz bottle of beer (355 mL), one 5 oz  glass of wine (148 mL), or one 1 oz glass of hard liquor (44 mL). General instructions Take over-the-counter and prescription medicines only as told by your doctor. Ask your doctor if you should avoid driving or using machines while you are taking your medicine. Return to your normal activities when your doctor says that it is safe. Keep all follow-up visits. Where to find more information National Institutes of Health: www.niams.nih.gov Contact a doctor if: You have another gout attack. You still have symptoms of a gout attack after 10 days of treatment. You have problems (side effects) because of your medicines. You have chills or a fever. You have burning pain when you pee (urinate). You have pain in your lower back or belly. Get help right away if: You have very bad pain. Your pain cannot be controlled. You cannot pee. Summary Gout is painful swelling of the joints. The most common site of pain is the big toe, but it can affect other joints. Medicines and avoiding some foods can help to prevent and treat gout attacks. This information is not intended to replace advice given to you by your health care provider. Make sure you discuss any questions you have with your health care provider. Document Revised: 09/22/2020 Document Reviewed: 09/22/2020 Elsevier Patient Education  2023 Elsevier Inc.  

## 2021-12-20 ENCOUNTER — Ambulatory Visit: Payer: Self-pay | Admitting: Nurse Practitioner

## 2022-01-16 ENCOUNTER — Ambulatory Visit: Payer: Self-pay | Admitting: Nurse Practitioner

## 2022-02-16 ENCOUNTER — Ambulatory Visit: Payer: Self-pay | Admitting: Nurse Practitioner

## 2022-02-28 ENCOUNTER — Encounter: Payer: Self-pay | Admitting: Nurse Practitioner

## 2022-02-28 ENCOUNTER — Ambulatory Visit (INDEPENDENT_AMBULATORY_CARE_PROVIDER_SITE_OTHER): Payer: No Typology Code available for payment source | Admitting: Nurse Practitioner

## 2022-02-28 VITALS — BP 152/100 | HR 76 | Temp 98.6°F | Ht 68.0 in | Wt 237.6 lb

## 2022-02-28 DIAGNOSIS — M79672 Pain in left foot: Secondary | ICD-10-CM

## 2022-02-28 DIAGNOSIS — E782 Mixed hyperlipidemia: Secondary | ICD-10-CM | POA: Diagnosis not present

## 2022-02-28 DIAGNOSIS — I129 Hypertensive chronic kidney disease with stage 1 through stage 4 chronic kidney disease, or unspecified chronic kidney disease: Secondary | ICD-10-CM | POA: Diagnosis not present

## 2022-02-28 DIAGNOSIS — N1832 Chronic kidney disease, stage 3b: Secondary | ICD-10-CM

## 2022-02-28 DIAGNOSIS — Z2821 Immunization not carried out because of patient refusal: Secondary | ICD-10-CM

## 2022-02-28 DIAGNOSIS — I10 Essential (primary) hypertension: Secondary | ICD-10-CM

## 2022-02-28 DIAGNOSIS — N183 Chronic kidney disease, stage 3 unspecified: Secondary | ICD-10-CM

## 2022-02-28 DIAGNOSIS — Z8739 Personal history of other diseases of the musculoskeletal system and connective tissue: Secondary | ICD-10-CM

## 2022-02-28 NOTE — Progress Notes (Signed)
Barnet Glasgow Martin,acting as a Education administrator for Minette Brine, FNP.,have documented all relevant documentation on the behalf of Minette Brine, FNP,as directed by  Minette Brine, FNP while in the presence of Minette Brine, [place].    Subjective:     Patient ID: Chase Walsh , male    DOB: Dec 01, 1970 , 52 y.o.   MRN: [id number]   Chief Complaint  Patient presents with   Hypertension    HPI  Patient presents today for bp check, patient states compliance with medications. Patient states he is not sleeping good at night. Patient states taking both amLODipine and olmesartan at night. He is also taking farxiga at night. He had been getting up at 2 am for his 3 am prayer that he feels his body has gotten used to.   He is to see his Nephrologist next month.   BP Readings from Last 3 Encounters: 02/28/22 : (!) 152/116 12/19/21 : 138/80 09/15/21 : 130/78    Hypertension This is a chronic problem. The current episode started more than 1 year ago. The problem is uncontrolled. Pertinent negatives include no anxiety.     Past Medical History:  Diagnosis Date   Diverticulosis    Gout    Hypertension    Lower GI bleed    Vitiligo      Family History  Problem Relation Age of Onset   Breast cancer Mother    Arthritis Mother    Cancer Paternal Uncle        maybe colon   Inflammatory bowel disease Neg Hx      Current Outpatient Medications:    amLODipine (NORVASC) 10 MG tablet, Take 1 tablet (10 mg total) by mouth daily., Disp: 90 tablet, Rfl: 1   atorvastatin (LIPITOR) 10 MG tablet, Take 1 tablet (10 mg total) by mouth daily., Disp: 30 tablet, Rfl: 11   dapagliflozin propanediol (FARXIGA) 10 MG TABS tablet, Take 1 tablet (10 mg total) by mouth daily before breakfast., Disp: 90 tablet, Rfl: 1   fluocinonide (LIDEX) 0.05 % external solution, Apply 1 application topically as needed., Disp: , Rfl:    olmesartan (BENICAR) 40 MG tablet, Take 1 tablet (40 mg total) by mouth daily., Disp: 90 tablet,  Rfl: 1   omeprazole (PRILOSEC OTC) 20 MG tablet, Take one tablet daily x 2 weeks then as needed, Disp: 30 tablet, Rfl: 1   predniSONE (STERAPRED UNI-PAK 21 TAB) 10 MG (21) TBPK tablet, Take as directed, Disp: 21 tablet, Rfl: 0   Allergies  Allergen Reactions   Allopurinol Rash     Review of Systems  Constitutional: Negative.   HENT: Negative.    Eyes: Negative.   Respiratory: Negative.    Cardiovascular: Negative.   Gastrointestinal: Negative.      Today's Vitals   02/28/22 1612 02/28/22 1656  BP: (!) 152/116 (!) 152/100  Pulse: 76   Temp: 98.6 F (37 C)   TempSrc: Oral   Weight: 237 lb 9.6 oz (107.8 kg)   Height: 5\' 8"  (1.727 m)   PainSc: 0-No pain    Body mass index is 36.13 kg/m.  Wt Readings from Last 3 Encounters:  02/28/22 237 lb 9.6 oz (107.8 kg)  12/19/21 233 lb (105.7 kg)  09/15/21 232 lb (105.2 kg)    Objective:  Physical Exam Vitals reviewed.  Constitutional:      General: He is not in acute distress.    Appearance: Normal appearance. He is obese.  Cardiovascular:     Rate and Rhythm: Normal rate  and regular rhythm.     Pulses: Normal pulses.     Heart sounds: Normal heart sounds. No murmur heard. Pulmonary:     Effort: Pulmonary effort is normal. No respiratory distress.     Breath sounds: Normal breath sounds. No wheezing.  Musculoskeletal:        General: Tenderness (left tood) present. No swelling. Normal range of motion.     Right lower leg: No edema.     Left lower leg: No edema.  Skin:    General: Skin is warm and dry.  Neurological:     General: No focal deficit present.     Mental Status: He is alert.         Assessment And Plan:     1. Benign hypertension with CKD (chronic kidney disease) stage III (HCC) Comments: Blood pressure is elevated, advised to take his olmesartan in am and amlodipine in evening. Also avoid high salt diet. - BMP8+eGFR  2. Mixed hyperlipidemia Comments: Cholesterol levels are stable, continue current  medications, tolerating statin well - Lipid panel  3. Herpes zoster vaccination declined Declines shingrix, educated on disease process and is aware if he changes his mind to notify office  4. Immunization declined Patient declined influenza vaccination at this time. Patient is aware that influenza vaccine prevents illness in 70% of healthy people, and reduces hospitalizations to 30-70% in elderly. This vaccine is recommended annually. Education has been provided regarding the importance of this vaccine but patient still declined. Advised may receive this vaccine at local pharmacy or Health Dept.or vaccine clinic. Aware to provide a copy of the vaccination record if obtained from local pharmacy or Health Dept.  Pt is willing to accept risk associated with refusing vaccination.  5. COVID-19 vaccination declined Declines covid 19 vaccine. Discussed risk of covid 14 and if he changes her mind about the vaccine to call the office. Education has been provided regarding the importance of this vaccine but patient still declined. Advised may receive this vaccine at local pharmacy or Health Dept.or vaccine clinic. Aware to provide a copy of the vaccination record if obtained from local pharmacy or Health Dept.  Encouraged to take multivitamin, vitamin d, vitamin c and zinc to increase immune system. Aware can call office if would like to have vaccine here at office. Verbalized acceptance and understanding.  6. History of gout - Uric acid  7. Left foot pain Comments: Will check uric acid level, negative swelling - Uric acid     Patient was given opportunity to ask questions. Patient verbalized understanding of the plan and was able to repeat key elements of the plan. All questions were answered to their satisfaction.  Minette Brine, FNP   I, Minette Brine, FNP, have reviewed all documentation for this visit. The documentation on 02/28/22 for the exam, diagnosis, procedures, and orders are all accurate  and complete.   IF YOU HAVE BEEN REFERRED TO A SPECIALIST, IT MAY TAKE 1-2 WEEKS TO SCHEDULE/PROCESS THE REFERRAL. IF YOU HAVE NOT HEARD FROM US/SPECIALIST IN TWO WEEKS, PLEASE GIVE [place] A CALL AT [phone].   THE PATIENT IS ENCOURAGED TO PRACTICE SOCIAL DISTANCING DUE TO THE COVID-19 PANDEMIC.

## 2022-02-28 NOTE — Patient Instructions (Addendum)
You need to take your olmesartan in the morning in addition to your [place]. Then take you amlodipine at night time.  Today take olmesartan with your amlodipine then tomorrow take the olmesartan midday and then on Thursday take in morning.

## 2022-03-01 ENCOUNTER — Encounter: Payer: Self-pay | Admitting: Nurse Practitioner

## 2022-03-01 ENCOUNTER — Other Ambulatory Visit: Payer: Self-pay | Admitting: Nurse Practitioner

## 2022-03-01 LAB — BMP8+EGFR
BUN/Creatinine Ratio: 8 — ABNORMAL LOW (ref 9–20)
BUN: 18 mg/dL (ref 6–24)
CO2: 22 mmol/L (ref 20–29)
Calcium: 10.1 mg/dL (ref 8.7–10.2)
Chloride: 100 mmol/L (ref 96–106)
Creatinine, Ser: 2.39 mg/dL — ABNORMAL HIGH (ref 0.76–1.27)
Glucose: 81 mg/dL (ref 70–99)
Potassium: 4.5 mmol/L (ref 3.5–5.2)
Sodium: 138 mmol/L (ref 134–144)
eGFR: 32 mL/min/{1.73_m2} — ABNORMAL LOW (ref >59)

## 2022-03-01 LAB — LIPID PANEL
Chol/HDL Ratio: 5.8 ratio — ABNORMAL HIGH (ref 0.0–5.0)
Cholesterol, Total: 179 mg/dL (ref 100–199)
HDL: 31 mg/dL — ABNORMAL LOW (ref >39)
LDL Chol Calc (NIH): 111 mg/dL — ABNORMAL HIGH (ref 0–99)
Triglycerides: 210 mg/dL — ABNORMAL HIGH (ref 0–149)
VLDL Cholesterol Cal: 37 mg/dL (ref 5–40)

## 2022-03-01 LAB — URIC ACID: Uric Acid: 10.2 mg/dL — ABNORMAL HIGH (ref 3.8–8.4)

## 2022-03-01 MED ORDER — PREDNISONE 10 MG (21) PO TBPK: ORAL_TABLET | ORAL | 0 refills | Status: DC

## 2022-04-27 ENCOUNTER — Encounter: Payer: Self-pay | Admitting: Nurse Practitioner

## 2022-05-01 ENCOUNTER — Ambulatory Visit (INDEPENDENT_AMBULATORY_CARE_PROVIDER_SITE_OTHER): Payer: No Typology Code available for payment source | Admitting: Nurse Practitioner

## 2022-05-01 VITALS — BP 120/86 | HR 66 | Temp 98.4°F | Ht 68.0 in | Wt 235.6 lb

## 2022-05-01 DIAGNOSIS — Z6835 Body mass index (BMI) 35.0-35.9, adult: Secondary | ICD-10-CM

## 2022-05-01 DIAGNOSIS — E782 Mixed hyperlipidemia: Secondary | ICD-10-CM

## 2022-05-01 DIAGNOSIS — M10472 Other secondary gout, left ankle and foot: Secondary | ICD-10-CM | POA: Diagnosis not present

## 2022-05-01 DIAGNOSIS — Z8739 Personal history of other diseases of the musculoskeletal system and connective tissue: Secondary | ICD-10-CM

## 2022-05-01 DIAGNOSIS — M109 Gout, unspecified: Secondary | ICD-10-CM

## 2022-05-01 MED ORDER — ATORVASTATIN CALCIUM 10 MG PO TABS: 10.0000 mg | ORAL_TABLET | Freq: Every day | ORAL | 1 refills | Status: DC

## 2022-05-01 MED ORDER — FEBUXOSTAT 40 MG PO TABS: 40.0000 mg | ORAL_TABLET | Freq: Every day | ORAL | 2 refills | Status: DC

## 2022-05-01 MED ORDER — PREDNISONE 10 MG (21) PO TBPK: ORAL_TABLET | ORAL | 0 refills | Status: DC

## 2022-05-01 NOTE — Progress Notes (Signed)
Chase Walsh,acting as a Neurosurgeon for Chase Felts, FNP.,have documented all relevant documentation on the behalf of Chase Felts, FNP,as directed by  Chase Felts, FNP while in the presence of Chase Felts, FNP.    Subjective:     Patient ID: Chase Walsh , male    DOB: 03-Mar-1970 , 52 y.o.   MRN: [id number]   Chief Complaint  Patient presents with   Gout    HPI  Patient presents today for gout in his left ankle. Patient reports pain started over 2 weeks ago. Patient reports the pain has stayed about the same. He noticed he had boneless chicken and he feels like this may have caused the flare. He has been limping on his foot for the last 2 weeks. The colchicine has been ineffective and he is not able to take the allopurinol due to having a rash.   BP Readings from Last 3 Encounters: 05/01/22 : 120/86 02/28/22 : (!) 152/100 12/19/21 : 138/80       Past Medical History:  Diagnosis Date   Diverticulosis    Gout    Hypertension    Lower GI bleed    Vitiligo      Family History  Problem Relation Age of Onset   Breast cancer Mother    Arthritis Mother    Cancer Paternal Uncle        maybe colon   Inflammatory bowel disease Neg Hx      Current Outpatient Medications:    amLODipine (NORVASC) 10 MG tablet, Take 1 tablet (10 mg total) by mouth daily., Disp: 90 tablet, Rfl: 1   dapagliflozin propanediol (FARXIGA) 10 MG TABS tablet, Take 1 tablet (10 mg total) by mouth daily before breakfast., Disp: 90 tablet, Rfl: 1   febuxostat (ULORIC) 40 MG tablet, Take 1 tablet (40 mg total) by mouth daily., Disp: 30 tablet, Rfl: 2   fluocinonide (LIDEX) 0.05 % external solution, Apply 1 application topically as needed., Disp: , Rfl:    olmesartan (BENICAR) 40 MG tablet, Take 1 tablet (40 mg total) by mouth daily., Disp: 90 tablet, Rfl: 1   omeprazole (PRILOSEC OTC) 20 MG tablet, Take one tablet daily x 2 weeks then as needed, Disp: 30 tablet, Rfl: 1   predniSONE (STERAPRED UNI-PAK  21 TAB) 10 MG (21) TBPK tablet, Take as directed, Disp: 21 tablet, Rfl: 0   atorvastatin (LIPITOR) 10 MG tablet, Take 1 tablet (10 mg total) by mouth daily., Disp: 90 tablet, Rfl: 1   Allergies  Allergen Reactions   Allopurinol Rash     Review of Systems  Constitutional: Negative.   Respiratory: Negative.    Cardiovascular: Negative.   Gastrointestinal: Negative.   Musculoskeletal:  Positive for arthralgias (left hand pain).  Neurological: Negative.      Today's Vitals   05/01/22 1137  BP: 120/86  Pulse: 66  Temp: 98.4 F (36.9 C)  TempSrc: Oral  Weight: 235 lb 9.6 oz (106.9 kg)  Height: 5\' 8"  (1.727 m)  PainSc: 6   PainLoc: Ankle   Body mass index is 35.82 kg/m.  Wt Readings from Last 3 Encounters:  05/01/22 235 lb 9.6 oz (106.9 kg)  02/28/22 237 lb 9.6 oz (107.8 kg)  12/19/21 233 lb (105.7 kg)    Objective:  Physical Exam Vitals reviewed.  Constitutional:      General: He is not in acute distress.    Appearance: Normal appearance. He is obese.  Cardiovascular:     Rate and Rhythm: Normal rate and  regular rhythm.     Pulses: Normal pulses.     Heart sounds: Normal heart sounds. No murmur heard. Pulmonary:     Effort: Pulmonary effort is normal. No respiratory distress.     Breath sounds: Normal breath sounds. No wheezing.  Musculoskeletal:        General: Swelling (left ankle) and tenderness (left hand/fingers) present.     Right wrist: Normal.     Left wrist: Normal. Tenderness: posterior wrist.  Skin:    General: Skin is warm and dry.  Neurological:     General: No focal deficit present.     Mental Status: He is alert.     Cranial Nerves: No cranial nerve deficit.     Motor: No weakness.         Assessment And Plan:     1. Other secondary acute gout of left ankle Comments: Has pain and swelling to left ankle, will treat with steroid. He is advised to avoid triggers. will try him on uloric which is better for his kidneys - febuxostat (ULORIC) 40  MG tablet; Take 1 tablet (40 mg total) by mouth daily.  Dispense: 30 tablet; Refill: 2 - predniSONE (STERAPRED UNI-PAK 21 TAB) 10 MG (21) TBPK tablet; Take as directed  Dispense: 21 tablet; Refill: 0  2. Mixed hyperlipidemia Comments: Cholesterol levels are stable, continue statin. - atorvastatin (LIPITOR) 10 MG tablet; Take 1 tablet (10 mg total) by mouth daily.  Dispense: 90 tablet; Refill: 1  3. History of gout - febuxostat (ULORIC) 40 MG tablet; Take 1 tablet (40 mg total) by mouth daily.  Dispense: 30 tablet; Refill: 2 - predniSONE (STERAPRED UNI-PAK 21 TAB) 10 MG (21) TBPK tablet; Take as directed  Dispense: 21 tablet; Refill: 0  4. Class 2 severe obesity due to excess calories with serious comorbidity and body mass index (BMI) of 35.0 to 35.9 in adult [facility]) He is encouraged to strive for BMI less than 30 to decrease cardiac risk. Advised to aim for at least 150 minutes of exercise per week.    Patient was given opportunity to ask questions. Patient verbalized understanding of the plan and was able to repeat key elements of the plan. All questions were answered to their satisfaction.  Chase Felts, FNP   I, Chase Felts, FNP, have reviewed all documentation for this visit. The documentation on 05/01/22 for the exam, diagnosis, procedures, and orders are all accurate and complete.   IF YOU HAVE BEEN REFERRED TO A SPECIALIST, IT MAY TAKE 1-2 WEEKS TO SCHEDULE/PROCESS THE REFERRAL. IF YOU HAVE NOT HEARD FROM US/SPECIALIST IN TWO WEEKS, PLEASE GIVE [place] A CALL AT [phone].   THE PATIENT IS ENCOURAGED TO PRACTICE SOCIAL DISTANCING DUE TO THE COVID-19 PANDEMIC.

## 2022-05-01 NOTE — Patient Instructions (Signed)
Gout  Gout is painful swelling of your joints. Gout is a type of arthritis. It is caused by having too much uric acid in your body. Uric acid is a chemical that is made when your body breaks down substances called purines. If your body has too much uric acid, sharp crystals can form and build up in your joints. This causes pain and swelling. Gout attacks can happen quickly and be very painful (acute gout). Over time, the attacks can affect more joints and happen more often (chronic gout). What are the causes? Gout is caused by too much uric acid in your blood. This can happen because: Your kidneys do not remove enough uric acid from your blood. Your body makes too much uric acid. You eat too many foods that are high in purines. These foods include organ meats, some seafood, and beer. Trauma or stress can bring on an attack. What increases the risk? Having a family history of gout. Being male and middle-aged. Being male and having gone through menopause. Having an organ transplant. Taking certain medicines. Having certain conditions, such as: Being very overweight (obese). Lead poisoning. Kidney disease. A skin condition called psoriasis. Other risks include: Losing weight too quickly. Not having enough water in the body (being dehydrated). Drinking alcohol, especially beer. Drinking beverages that are sweetened with a type of sugar called fructose. What are the signs or symptoms? An attack of acute gout often starts at night and usually happens in just one joint. The most common place is the big toe. Other joints that may be affected include joints of the feet, ankle, knee, fingers, wrist, or elbow. Symptoms may include: Very bad pain. Warmth. Swelling. Stiffness. Tenderness. The affected joint may be very painful to touch. Shiny, red, or purple skin. Chills and fever. Chronic gout may cause symptoms more often. More joints may be involved. You may also have white or yellow lumps  (tophi) on your hands or feet or in other areas near your joints. How is this treated? Treatment for an acute attack may include medicines for pain and swelling, such as: NSAIDs, such as ibuprofen. Steroids taken by mouth or injected into a joint. Colchicine. This can be given by mouth or through an IV tube. Treatment to prevent future attacks may include: Taking small doses of NSAIDs or colchicine daily. Using a medicine that reduces uric acid levels in your blood, such as allopurinol. Making changes to your diet. You may need to see a food expert (dietitian) about what to eat and drink to prevent gout. Follow these instructions at home: During a gout attack  If told, put ice on the painful area. To do this: Put ice in a plastic bag. Place a towel between your skin and the bag. Leave the ice on for 20 minutes, 2-3 times a day. Take off the ice if your skin turns bright red. This is very important. If you cannot feel pain, heat, or cold, you have a greater risk of damage to the area. Raise the painful joint above the level of your heart as often as you can. Rest the joint as much as possible. If the joint is in your leg, you may be given crutches. Follow instructions from your doctor about what you cannot eat or drink. Avoiding future gout attacks Eat a low-purine diet. Avoid foods and drinks such as: Liver. Kidney. Anchovies. Asparagus. Herring. Mushrooms. Mussels. Beer. Stay at a healthy weight. If you want to lose weight, talk with your doctor. Do not   lose weight too fast. Start or continue an exercise plan as told by your doctor. Eating and drinking Avoid drinks sweetened by fructose. Drink enough fluids to keep your pee (urine) pale yellow. If you drink alcohol: Limit how much you have to: 0-1 drink a day for women who are not pregnant. 0-2 drinks a day for men. Know how much alcohol is in a drink. In the [place] one drink equals one 12 oz bottle of beer (355 mL), one 5 oz  glass of wine (148 mL), or one 1 oz glass of hard liquor (44 mL). General instructions Take over-the-counter and prescription medicines only as told by your doctor. Ask your doctor if you should avoid driving or using machines while you are taking your medicine. Return to your normal activities when your doctor says that it is safe. Keep all follow-up visits. Where to find more information National Institutes of Health: www.niams.nih.gov Contact a doctor if: You have another gout attack. You still have symptoms of a gout attack after 10 days of treatment. You have problems (side effects) because of your medicines. You have chills or a fever. You have burning pain when you pee (urinate). You have pain in your lower back or belly. Get help right away if: You have very bad pain. Your pain cannot be controlled. You cannot pee. Summary Gout is painful swelling of the joints. The most common site of pain is the big toe, but it can affect other joints. Medicines and avoiding some foods can help to prevent and treat gout attacks. This information is not intended to replace advice given to you by your health care provider. Make sure you discuss any questions you have with your health care provider. Document Revised: 09/22/2020 Document Reviewed: 09/22/2020 Elsevier Patient Education  2023 Elsevier Inc.  

## 2022-05-14 ENCOUNTER — Encounter: Payer: Self-pay | Admitting: Nurse Practitioner

## 2022-05-14 DIAGNOSIS — E782 Mixed hyperlipidemia: Secondary | ICD-10-CM | POA: Insufficient documentation

## 2022-05-14 DIAGNOSIS — M109 Gout, unspecified: Secondary | ICD-10-CM | POA: Insufficient documentation

## 2022-05-14 DIAGNOSIS — Z8739 Personal history of other diseases of the musculoskeletal system and connective tissue: Secondary | ICD-10-CM | POA: Insufficient documentation

## 2022-05-25 ENCOUNTER — Encounter: Payer: Self-pay | Admitting: Nurse Practitioner

## 2022-08-28 NOTE — Progress Notes (Signed)
Madelaine Bhat, CMA,acting as a Neurosurgeon for [name], FNP.,have documented all relevant documentation on the behalf of [name], FNP,as directed by  [name], FNP while in the presence of [name], FNP.  Subjective:  Patient ID: Chase Walsh , male    DOB: 15-Nov-1970 , 52 y.o.   MRN: [id number]  Chief Complaint  Patient presents with   Hypertension    HPI  Patient presents today a bp and chol follow up, patient reports compliance with medications. Patient denies any chest pain, SOB, and headaches. Patient has no concerns today. He continues to get [place] with patient assistance. He will see Nephrology in October.  He has been staying hydrated has drank 32 oz water so far today. He has not been going to the gym recently - he was going early in the morning. He is resisting foods. He is trying to stop eating pork - ribs, bacon. When he eats boneless chicken he will have a runny nose. He will eat bone in chicken.   BP Readings from Last 3 Encounters: 08/29/22 : 130/80 05/01/22 : 120/86 02/28/22 : (!) 152/100   Wt Readings from Last 3 Encounters: 08/29/22 : 230 lb 3.2 oz (104.4 kg) 05/01/22 : 235 lb 9.6 oz (106.9 kg) 02/28/22 : 237 lb 9.6 oz (107.8 kg)       Past Medical History:  Diagnosis Date   Class 1 obesity due to excess calories with serious comorbidity and body mass index (BMI) of 33.0 to 33.9 in adult 07/08/2018   Diverticulosis    Fall 11/21/2018   Gout    Hematochezia 04/21/2016   History of COVID-19 02/05/2019   Hypertension    Lower GI bleed    Vitiligo      Family History  Problem Relation Age of Onset   Breast cancer Mother    Arthritis Mother    Cancer Paternal Uncle        maybe colon   Inflammatory bowel disease Neg Hx      Current Outpatient Medications:    atorvastatin (LIPITOR) 10 MG tablet, Take 1 tablet (10 mg total) by mouth daily., Disp: 90 tablet, Rfl: 1   dapagliflozin propanediol (FARXIGA) 10 MG TABS tablet, Take 1 tablet  (10 mg total) by mouth daily before breakfast., Disp: 90 tablet, Rfl: 1   febuxostat (ULORIC) 40 MG tablet, Take 1 tablet (40 mg total) by mouth daily., Disp: 30 tablet, Rfl: 2   fluocinonide (LIDEX) 0.05 % external solution, Apply 1 application topically as needed., Disp: , Rfl:    amLODipine (NORVASC) 10 MG tablet, Take 1 tablet (10 mg total) by mouth daily., Disp: 90 tablet, Rfl: 1   olmesartan (BENICAR) 40 MG tablet, Take 1 tablet (40 mg total) by mouth daily., Disp: 90 tablet, Rfl: 1   Allergies  Allergen Reactions   Allopurinol Rash     Review of Systems  Constitutional: Negative.   HENT: Negative.    Eyes: Negative.   Respiratory: Negative.    Cardiovascular: Negative.   Gastrointestinal: Negative.   Neurological: Negative.   Psychiatric/Behavioral: Negative.       Today's Vitals   08/29/22 1427  BP: 130/80  Pulse: 68  Temp: 98.3 F (36.8 C)  TempSrc: Oral  Weight: 230 lb 3.2 oz (104.4 kg)  Height: 5\' 8"  (1.727 m)  PainSc: 0-No pain   Body mass index is 35 kg/m.  Wt Readings from Last 3 Encounters:  08/29/22 230 lb 3.2 oz (104.4 kg)  05/01/22 235 lb  9.6 oz (106.9 kg)  02/28/22 237 lb 9.6 oz (107.8 kg)     Objective:  Physical Exam Vitals reviewed.  Constitutional:      General: He is not in acute distress.    Appearance: Normal appearance. He is obese.  Cardiovascular:     Rate and Rhythm: Normal rate and regular rhythm.     Pulses: Normal pulses.     Heart sounds: Normal heart sounds. No murmur heard. Pulmonary:     Effort: Pulmonary effort is normal. No respiratory distress.     Breath sounds: Normal breath sounds. No wheezing.  Musculoskeletal:        General: No swelling or tenderness. Normal range of motion.     Right lower leg: No edema.     Left lower leg: No edema.  Skin:    General: Skin is warm and dry.  Neurological:     General: No focal deficit present.     Mental Status: He is alert and oriented to person, place, and time.      Cranial Nerves: No cranial nerve deficit.     Motor: No weakness.  Psychiatric:        Mood and Affect: Mood normal.        Behavior: Behavior normal.        Thought Content: Thought content normal.        Judgment: Judgment normal.         Assessment And Plan:  Mixed hyperlipidemia Assessment & Plan: Cholesterol levels are improved at last visit, will recheck lipid panel today. Continue statin.  Orders: -     Lipid panel -     BMP8+eGFR  Benign hypertension with CKD (chronic kidney disease) stage III (HCC) Assessment & Plan: Blood pressure is fairly controlled, continue current medications  Orders: -     amLODIPine Besylate; Take 1 tablet (10 mg total) by mouth daily.  Dispense: 90 tablet; Refill: 1 -     Olmesartan Medoxomil; Take 1 tablet (40 mg total) by mouth daily.  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    Herpes zoster vaccination declined Assessment & Plan: Declines shingrix, educated on disease process and is aware if he changes his mind to notify office    Influenza vaccination declined Assessment & Plan: Patient declined influenza vaccination at this time. Patient is aware that influenza vaccine prevents illness in 70% of healthy people, and reduces hospitalizations to 30-70% in elderly. This vaccine is recommended annually. Education has been provided regarding the importance of this vaccine but patient still declined. Advised may receive this vaccine at local pharmacy or Health Dept.or vaccine clinic. Aware to provide a copy of the vaccination record if obtained from local pharmacy or Health Dept.  Pt is willing to accept risk associated with refusing vaccination.     Class 2 severe obesity due to excess calories with serious comorbidity and body mass index (BMI) of 35.0 to 35.9 in  adult [facility]) Assessment & Plan: He is encouraged to strive for BMI less than 30 to decrease cardiac risk. Advised to aim for at least 150 minutes of exercise per week.   Orders: -     Hemoglobin A1c    Return for 6 month bp check.  Patient was given opportunity to ask questions. Patient verbalized understanding of the plan and was  able to repeat key elements of the plan. All questions were answered to their satisfaction.   Jeanell Sparrow, FNP, have reviewed all documentation for this visit. The documentation on 08/29/22 for the exam, diagnosis, procedures, and orders are all accurate and complete.   IF YOU HAVE BEEN REFERRED TO A SPECIALIST, IT MAY TAKE 1-2 WEEKS TO SCHEDULE/PROCESS THE REFERRAL. IF YOU HAVE NOT HEARD FROM US/SPECIALIST IN TWO WEEKS, PLEASE GIVE [place] A CALL AT [phone].

## 2022-08-29 ENCOUNTER — Encounter: Payer: Self-pay | Admitting: Nurse Practitioner

## 2022-08-29 ENCOUNTER — Ambulatory Visit (INDEPENDENT_AMBULATORY_CARE_PROVIDER_SITE_OTHER): Payer: No Typology Code available for payment source | Admitting: Nurse Practitioner

## 2022-08-29 ENCOUNTER — Other Ambulatory Visit: Payer: Self-pay | Admitting: Pharmacist

## 2022-08-29 VITALS — BP 130/80 | HR 68 | Temp 98.3°F | Ht 68.0 in | Wt 230.2 lb

## 2022-08-29 DIAGNOSIS — Z2821 Immunization not carried out because of patient refusal: Secondary | ICD-10-CM

## 2022-08-29 DIAGNOSIS — I129 Hypertensive chronic kidney disease with stage 1 through stage 4 chronic kidney disease, or unspecified chronic kidney disease: Secondary | ICD-10-CM

## 2022-08-29 DIAGNOSIS — Z6835 Body mass index (BMI) 35.0-35.9, adult: Secondary | ICD-10-CM

## 2022-08-29 DIAGNOSIS — E782 Mixed hyperlipidemia: Secondary | ICD-10-CM

## 2022-08-29 DIAGNOSIS — N1832 Chronic kidney disease, stage 3b: Secondary | ICD-10-CM | POA: Diagnosis not present

## 2022-08-29 DIAGNOSIS — N183 Chronic kidney disease, stage 3 unspecified: Secondary | ICD-10-CM

## 2022-08-29 MED ORDER — AMLODIPINE BESYLATE 10 MG PO TABS: 10.0000 mg | ORAL_TABLET | Freq: Every day | ORAL | 1 refills | Status: DC

## 2022-08-29 MED ORDER — OLMESARTAN MEDOXOMIL 40 MG PO TABS: 40.0000 mg | ORAL_TABLET | Freq: Every day | ORAL | 1 refills | Status: DC

## 2022-08-29 NOTE — Progress Notes (Signed)
Care Coordination Call  Received messaged from [employer] that patient is due to renew [place] assistance through [place] and [place]. Will collaborate with technician team to do so.   Catie Eppie Gibson, PharmD, BCACP, CPP Clinical Pharmacist [facility] [phone]

## 2022-08-30 ENCOUNTER — Telehealth: Payer: Self-pay

## 2022-08-30 LAB — BMP8+EGFR
BUN/Creatinine Ratio: 9 (ref 9–20)
BUN: 23 mg/dL (ref 6–24)
CO2: 24 mmol/L (ref 20–29)
Calcium: 9.6 mg/dL (ref 8.7–10.2)
Chloride: 103 mmol/L (ref 96–106)
Creatinine, Ser: 2.49 mg/dL — ABNORMAL HIGH (ref 0.76–1.27)
Glucose: 89 mg/dL (ref 70–99)
Potassium: 4.7 mmol/L (ref 3.5–5.2)
Sodium: 137 mmol/L (ref 134–144)
eGFR: 30 mL/min/{1.73_m2} — ABNORMAL LOW (ref >59)

## 2022-08-30 LAB — HEMOGLOBIN A1C
Est. average glucose Bld gHb Est-mCnc: 108 mg/dL
Hgb A1c MFr Bld: 5.4 % (ref 4.8–5.6)

## 2022-08-30 LAB — LIPID PANEL
Chol/HDL Ratio: 6.4 ratio — ABNORMAL HIGH (ref 0.0–5.0)
Cholesterol, Total: 174 mg/dL (ref 100–199)
HDL: 27 mg/dL — ABNORMAL LOW (ref >39)
LDL Chol Calc (NIH): 111 mg/dL — ABNORMAL HIGH (ref 0–99)
Triglycerides: 203 mg/dL — ABNORMAL HIGH (ref 0–149)
VLDL Cholesterol Cal: 36 mg/dL (ref 5–40)

## 2022-08-30 NOTE — Telephone Encounter (Signed)
Mailed [place] application to patients home for Farxiga PAP re-enrollment.

## 2022-08-30 NOTE — Telephone Encounter (Signed)
-----   Message from [employer] sent at 08/29/2022  2:57 PM EDT ----- Please assist with [place]ment in [place] Farxiga - expired last week. Thanks!

## 2022-09-10 ENCOUNTER — Encounter: Payer: Self-pay | Admitting: Nurse Practitioner

## 2022-09-10 DIAGNOSIS — N183 Chronic kidney disease, stage 3 unspecified: Secondary | ICD-10-CM | POA: Insufficient documentation

## 2022-09-10 DIAGNOSIS — Z2821 Immunization not carried out because of patient refusal: Secondary | ICD-10-CM | POA: Insufficient documentation

## 2022-09-10 MED ORDER — ATORVASTATIN CALCIUM 20 MG PO TABS: 20.0000 mg | ORAL_TABLET | Freq: Every day | ORAL | 1 refills | Status: DC

## 2022-09-10 NOTE — Assessment & Plan Note (Signed)
Blood pressure is fairly controlled, continue current medications.  

## 2022-09-10 NOTE — Assessment & Plan Note (Signed)
Continue f/u with Nephrology and continue avoiding nephrotoxic medications.

## 2022-09-10 NOTE — Assessment & Plan Note (Signed)

## 2022-09-10 NOTE — Assessment & Plan Note (Signed)
He is encouraged to strive for BMI less than 30 to decrease cardiac risk. Advised to aim for at least 150 minutes of exercise per week.  

## 2022-09-10 NOTE — Assessment & Plan Note (Addendum)
Cholesterol levels are improved at last visit, will recheck lipid panel today. Continue statin.

## 2022-09-10 NOTE — Assessment & Plan Note (Signed)
Declines shingrix, educated on disease process and is aware if he changes his mind to notify office  

## 2022-09-14 NOTE — Telephone Encounter (Signed)
Rec'd completed pt pages.   Faxed pcp pages to provider

## 2022-09-19 NOTE — Telephone Encounter (Signed)
Submitted application for FARXIGA to [place] for patient assistance.   Phone: [phone]  

## 2022-09-22 NOTE — Telephone Encounter (Signed)
Received notification from [place] regarding approval for [facility]. Patient assistance approved from 09/09/22 to 09/19/23.  Medication will ship to [facility].  Pt ID: [id number]  Company phone: [phone]

## 2022-10-01 DIAGNOSIS — I213 ST elevation (STEMI) myocardial infarction of unspecified site: Secondary | ICD-10-CM | POA: Insufficient documentation

## 2022-10-04 ENCOUNTER — Other Ambulatory Visit: Payer: Self-pay | Admitting: Nurse Practitioner

## 2022-10-04 DIAGNOSIS — I2121 ST elevation (STEMI) myocardial infarction involving left circumflex coronary artery: Secondary | ICD-10-CM

## 2022-10-05 ENCOUNTER — Other Ambulatory Visit: Payer: Self-pay | Admitting: Nurse Practitioner

## 2022-10-11 ENCOUNTER — Ambulatory Visit: Payer: No Typology Code available for payment source | Admitting: Nurse Practitioner

## 2022-10-11 ENCOUNTER — Encounter: Payer: Self-pay | Admitting: Nurse Practitioner

## 2022-10-11 VITALS — BP 120/80 | HR 69 | Temp 98.5°F | Ht 68.0 in | Wt 222.8 lb

## 2022-10-11 DIAGNOSIS — N183 Chronic kidney disease, stage 3 unspecified: Secondary | ICD-10-CM | POA: Diagnosis not present

## 2022-10-11 DIAGNOSIS — I129 Hypertensive chronic kidney disease with stage 1 through stage 4 chronic kidney disease, or unspecified chronic kidney disease: Secondary | ICD-10-CM | POA: Diagnosis not present

## 2022-10-11 DIAGNOSIS — I2121 ST elevation (STEMI) myocardial infarction involving left circumflex coronary artery: Secondary | ICD-10-CM

## 2022-10-11 DIAGNOSIS — Z09 Encounter for follow-up examination after completed treatment for conditions other than malignant neoplasm: Secondary | ICD-10-CM

## 2022-10-11 DIAGNOSIS — E6609 Other obesity due to excess calories: Secondary | ICD-10-CM

## 2022-10-11 DIAGNOSIS — R5383 Other fatigue: Secondary | ICD-10-CM

## 2022-10-11 DIAGNOSIS — Z8679 Personal history of other diseases of the circulatory system: Secondary | ICD-10-CM | POA: Insufficient documentation

## 2022-10-11 DIAGNOSIS — Z6833 Body mass index (BMI) 33.0-33.9, adult: Secondary | ICD-10-CM

## 2022-10-11 DIAGNOSIS — E66811 Obesity, class 1: Secondary | ICD-10-CM

## 2022-10-11 NOTE — Progress Notes (Addendum)
Madelaine Bhat, CMA,acting as a Neurosurgeon for [name], FNP.,have documented all relevant documentation on the behalf of [name], FNP,as directed by  [name], FNP while in the presence of [name], FNP.  Subjective:  Patient ID: Chase Walsh , male    DOB: [birth year] , 52 y.o.   MRN: [id number]  Chief Complaint  Patient presents with   Hospitalization Follow-up    HPI  Patient presents today for a hospital follow up, Patient reports compliance with medication. Patient denies any chest pain, SOB, or headaches. Patient went to hospital on 10/01/2022 and discharged 10/04/2022. Patient was diagnosed with CAD (coronary artery disease) ST elevation myocardial infarction (STEMI) involving other coronary artery of inferior wall. He went into vfib arrest secondary to the STEMI. CPR was started immediately. He is to have an appt with Dr. Jacinto Halim later this week. He will likely need cardiac rehab. Will see what happens at his appt. Today patient is feeling fatigue, he reports he believes it is his medications. Yesterday was the first day he coughed, did not fully expand  I have recommended to not drive until he is seen by Cardiology  BP Readings from Last 3 Encounters: 10/11/22 : (!) 140/100 08/29/22 : 130/80 05/01/22 : 120/86        Past Medical History:  Diagnosis Date   Allergy    Class 1 obesity due to excess calories with serious comorbidity and body mass index (BMI) of 33.0 to 33.9 in adult 07/08/2018   Diverticulosis    Fall 11/21/2018   Gout    Hematochezia 04/21/2016   History of COVID-19 02/05/2019   Hypertension    Lower GI bleed    Myocardial infarction (HCC) 10/01/22   Vitiligo      Family History  Problem Relation Age of Onset   Breast cancer Mother    Arthritis Mother    Cancer Mother    Obesity Mother    Cancer Paternal Uncle        maybe colon   Stroke Father    Diabetes Sister    Obesity Sister    Stroke Sister    Diabetes Brother    Kidney  disease Brother    Obesity Brother    Kidney disease Brother    Inflammatory bowel disease Neg Hx      Current Outpatient Medications:    fluocinonide (LIDEX) 0.05 % external solution, Apply 1 application  topically as needed (breakout)., Disp: , Rfl:    nitroGLYCERIN (NITROSTAT) 0.4 MG SL tablet, Place 0.4 mg under the tongue every 5 (five) minutes as needed for chest pain., Disp: , Rfl:    Ascorbic Acid (VITAMIN C) 1000 MG tablet, Take 1,000 mg by mouth every other day., Disp: , Rfl:    aspirin 81 MG chewable tablet, Chew 1 tablet (81 mg total) by mouth daily., Disp: 90 tablet, Rfl: 2   atorvastatin (LIPITOR) 80 MG tablet, Take 1 tablet (80 mg total) by mouth daily., Disp: 90 tablet, Rfl: 2   magnesium oxide (MAG-OX) 400 (240 Mg) MG tablet, Take 400 mg by mouth daily., Disp: , Rfl:    metoprolol succinate (TOPROL-XL) 25 MG 24 hr tablet, Take 1 tablet (25 mg total) by mouth daily., Disp: 90 tablet, Rfl: 2   ticagrelor (BRILINTA) 90 MG TABS tablet, Take 1 tablet (90 mg total) by mouth 2 (two) times daily., Disp: 60 tablet, Rfl: 2   Allergies  Allergen Reactions   Allopurinol Rash     Review of Systems  Constitutional: Negative.   HENT: Negative.    Eyes: Negative.   Respiratory: Negative.    Cardiovascular:  Positive for chest pain.  Gastrointestinal: Negative.   Neurological: Negative.   Psychiatric/Behavioral: Negative.       Today's Vitals   10/11/22 0930 10/11/22 0952  BP: (!) 140/100 120/80  Pulse: 69   Temp: 98.5 F (36.9 C)   TempSrc: Oral   Weight: 222 lb 12.8 oz (101.1 kg)   Height: 5\' 8"  (1.727 m)   PainSc: 0-No pain    Body mass index is 33.88 kg/m.  Wt Readings from Last 3 Encounters:  11/01/22 214 lb 6.4 oz (97.3 kg)  10/22/22 225 lb 15.5 oz (102.5 kg)  10/19/22 226 lb (102.5 kg)    The ASCVD Risk score (Arnett DK, et al., 2019) failed to calculate for the following reasons:   The patient has a prior MI or stroke diagnosis  Objective:  Physical  Exam Vitals reviewed.  Constitutional:      General: He is not in acute distress.    Appearance: Normal appearance. He is obese.  Cardiovascular:     Rate and Rhythm: Normal rate and regular rhythm.     Pulses: Normal pulses.     Heart sounds: Normal heart sounds. No murmur heard. Pulmonary:     Effort: Pulmonary effort is normal. No respiratory distress.     Breath sounds: Normal breath sounds. No wheezing.  Skin:    General: Skin is warm and dry.  Neurological:     General: No focal deficit present.     Mental Status: He is alert and oriented to person, place, and time.     Cranial Nerves: No cranial nerve deficit.     Motor: No weakness.  Psychiatric:        Mood and Affect: Mood normal.        Behavior: Behavior normal.        Thought Content: Thought content normal.        Judgment: Judgment normal.         Assessment And Plan:  ST elevation myocardial infarction involving left circumflex coronary artery [facility]) Assessment & Plan: TCM Performed. A member of the clinical team spoke with the patient upon dischare. Discharge summary was reviewed in full detail during the visit. Meds reconciled and compared to discharge meds. Medication list is updated and reviewed with the patient.  Greater than 50% face to face time was spent in counseling an coordination of care.  All questions were answered to the satisfaction of the patient.   He went into vfib arrest and was found to have an MI. He is feeling better today but still fatigued.   Orders: -     Ambulatory referral to Home Health -     Amb ref to Medical Nutrition Therapy-MNT  Fatigue, unspecified type Assessment & Plan: Likely related to his recent hospitalization. Explained this will take some time to improve.   Benign hypertension with CKD (chronic kidney disease) stage III (HCC) Assessment & Plan: Blood pressure was elevated initially repeat is better. Continue current medications and he will be following up with  Cardiology.   Orders: -     BMP8+eGFR  Class 1 obesity due to excess calories with body mass index (BMI) of 33.0 to 33.9 in adult, unspecified whether serious comorbidity present Assessment & Plan: he is encouraged to strive for BMI less than 30 to decrease cardiac risk. Advised to aim for at least 150 minutes of exercise  per week.    Hx of ventricular fibrillation -     BMP8+eGFR    No follow-ups on file.  Patient was given opportunity to ask questions. Patient verbalized understanding of the plan and was able to repeat key elements of the plan. All questions were answered to their satisfaction.    Jeanell Sparrow, FNP, have reviewed all documentation for this visit. The documentation on 10/11/22 for the exam, diagnosis, procedures, and orders are all accurate and complete.   IF YOU HAVE BEEN REFERRED TO A SPECIALIST, IT MAY TAKE 1-2 WEEKS TO SCHEDULE/PROCESS THE REFERRAL. IF YOU HAVE NOT HEARD FROM US/SPECIALIST IN TWO WEEKS, PLEASE GIVE [place] A CALL AT [phone] X [phone].

## 2022-10-11 NOTE — Patient Instructions (Addendum)
[facility] for cardiac disease - novonordisk take a look at it and think about it. This will help with your cardiac health.   Dr. Jacinto Halim [phone] he is the Cardiologist  Take atorvastatin at bedtime.

## 2022-10-12 LAB — BMP8+EGFR
BUN/Creatinine Ratio: 10 (ref 9–20)
BUN: 27 mg/dL — ABNORMAL HIGH (ref 6–24)
CO2: 20 mmol/L (ref 20–29)
Calcium: 10 mg/dL (ref 8.7–10.2)
Chloride: 102 mmol/L (ref 96–106)
Creatinine, Ser: 2.66 mg/dL — ABNORMAL HIGH (ref 0.76–1.27)
Glucose: 87 mg/dL (ref 70–99)
Potassium: 5 mmol/L (ref 3.5–5.2)
Sodium: 139 mmol/L (ref 134–144)
eGFR: 28 mL/min/{1.73_m2} — ABNORMAL LOW (ref >59)

## 2022-10-18 NOTE — Progress Notes (Signed)
Cardiology Office Note:    Date:  10/22/2022   ID:  Chase Walsh, DOB December 05, 1970, MRN [id number]  PCP:  [name], FNP  Cardiologist:  Little Ishikawa, MD  Electrophysiologist:  None   Referring MD: [name], FNP   Chief Complaint  Patient presents with   New Patient (Initial Visit)        Shortness of Breath   Chest Pain    Pressure when coughing. Pain in ribs.    History of Present Illness:    Chase Walsh is a 52 y.o. male with a hx of CAD s/p STEMI 09/2022 complicated by VF arrest, hypertension, hyperlipidemia, CKD stage IV who is referred by [name], NP for evaluation of CAD.  He was admitted 10/01/2022 in [place] with VF arrest due to STEMI.  Underwent LHC which showed distal LCx occlusion status post DES, also noted to have moderate disease in LAD and RCA.  Echocardiogram showed normal LVEF, no significant valvular disease.    Denies any chest pain, lightheadedness, syncope, lower extremity edema, or palpitations.  He does report some shortness of breath.  Has not been exercising.  Denies any bleeding on DAPT.  No smoking history.  No known family history of heart disease.   Past Medical History:  Diagnosis Date   Allergy    Class 1 obesity due to excess calories with serious comorbidity and body mass index (BMI) of 33.0 to 33.9 in adult 07/08/2018   Diverticulosis    Fall 11/21/2018   Gout    Hematochezia 04/21/2016   History of COVID-19 02/05/2019   Hypertension    Lower GI bleed    Myocardial infarction (HCC) 10/01/22   Vitiligo     Past Surgical History:  Procedure Laterality Date   COLONOSCOPY N/A 04/22/2016   Procedure: COLONOSCOPY;  Surgeon: Sherrilyn Rist, MD;  Location: [facility] ENDOSCOPY;  Service: Endoscopy;  Laterality: N/A;   HERNIA REPAIR      Current Medications: Current Meds  Medication Sig   aspirin 81 MG chewable tablet Chew 1 tablet by mouth daily.   atorvastatin (LIPITOR) 80 MG tablet Take 1 tablet by mouth  daily.   fluocinonide (LIDEX) 0.05 % external solution Apply 1 application topically as needed.   metoprolol succinate (TOPROL-XL) 25 MG 24 hr tablet Take 25 mg by mouth daily.   nitroGLYCERIN (NITROSTAT) 0.4 MG SL tablet Place 0.4 mg under the tongue every 5 (five) minutes as needed for chest pain.   ticagrelor (BRILINTA) 90 MG TABS tablet Take 90 mg by mouth 2 (two) times daily.   [DISCONTINUED] febuxostat (ULORIC) 40 MG tablet Take 1 tablet (40 mg total) by mouth daily.     Allergies:   Allopurinol   Social History   Socioeconomic History   Marital status: Married    Spouse name: Not on file   Number of children: Not on file   Years of education: Not on file   Highest education level: 12th grade  Occupational History   Not on file  Tobacco Use   Smoking status: Never   Smokeless tobacco: Never  Substance and Sexual Activity   Alcohol use: No   Drug use: No   Sexual activity: Yes    Partners: Female    Comment: married, monogamous  Other Topics Concern   Not on file  Social History Narrative   Not on file   Social Determinants of Health   Financial Resource Strain: Low Risk  (05/01/2022)   Overall Financial  Resource Strain (CARDIA)    Difficulty of Paying Living Expenses: Not very hard  Food Insecurity: No Food Insecurity (10/22/2022)   Hunger Vital Sign    Worried About Running Out of Food in the Last Year: Never true    Ran Out of Food in the Last Year: Never true  Transportation Needs: No Transportation Needs (10/22/2022)   PRAPARE - Administrator, Civil Service (Medical): No    Lack of Transportation (Non-Medical): No  Physical Activity: Insufficiently Active (05/01/2022)   Exercise Vital Sign    Days of Exercise per Week: 3 days    Minutes of Exercise per Session: 40 min  Stress: No Stress Concern Present (05/01/2022)   [employer] of Occupational Health - Occupational Stress Questionnaire    Feeling of Stress : Not at all  Social  Connections: Socially Integrated (05/01/2022)   Social Connection and Isolation Panel [NHANES]    Frequency of Communication with Friends and Family: More than three times a week    Frequency of Social Gatherings with Friends and Family: Once a week    Attends Religious Services: More than 4 times per year    Active Member of [employer] or Organizations: Yes    Attends Engineer, structural: More than 4 times per year    Marital Status: Married     Family History: The patient's family history includes Arthritis in his mother; Breast cancer in his mother; Cancer in his mother and paternal uncle; Diabetes in his brother and sister; Kidney disease in his brother and brother; Obesity in his brother, mother, and sister; Stroke in his father and sister. There is no history of Inflammatory bowel disease.  ROS:   Please see the history of present illness.     All other systems reviewed and are negative.  EKGs/Labs/Other Studies Reviewed:    The following studies were reviewed today:   EKG:   10/19/22: Sinus bradycardia, rate 56, Q waves in leads III, aVF  Recent Labs: 10/22/2022: ALT 36; BUN 20; Creatinine, Ser 2.54; Hemoglobin 15.4; Magnesium 2.3; Platelets 299; Potassium 4.4; Sodium 137  Recent Lipid Panel    Component Value Date/Time   CHOL 174 08/29/2022 1501   TRIG 203 (H) 08/29/2022 1501   HDL 27 (L) 08/29/2022 1501   CHOLHDL 6.4 (H) 08/29/2022 1501   LDLCALC 111 (H) 08/29/2022 1501    Physical Exam:    VS:  BP 126/84 (BP Location: Right Arm, Patient Position: Sitting, Cuff Size: Large)   Pulse (!) 56   Ht 5\' 8"  (1.727 m)   Wt 226 lb (102.5 kg)   BMI 34.36 kg/m     Wt Readings from Last 3 Encounters:  10/22/22 225 lb 15.5 oz (102.5 kg)  10/19/22 226 lb (102.5 kg)  10/11/22 222 lb 12.8 oz (101.1 kg)     GEN:  Well nourished, well developed in no acute distress HEENT: Normal NECK: No JVD; No carotid bruits LYMPHATICS: No lymphadenopathy CARDIAC: RRR, no murmurs,  rubs, gallops RESPIRATORY:  Clear to auscultation without rales, wheezing or rhonchi  ABDOMEN: Soft, non-tender, non-distended MUSCULOSKELETAL:  No edema; No deformity  SKIN: Warm and dry NEUROLOGIC:  Alert and oriented x 3 PSYCHIATRIC:  Normal affect   ASSESSMENT:    1. Coronary artery disease involving native coronary artery of native heart without angina pectoris   2. Hx of ventricular fibrillation   3. Essential hypertension   4. Mixed hyperlipidemia   5. Chronic kidney disease (CKD), stage IV (severe) (HCC)  PLAN:    CAD: admitted 10/01/2022 in [place] with VF arrest due to STEMI.  Underwent LHC which showed distal LCx occlusion status post DES, also noted to have moderate disease in LAD and RCA.  Echocardiogram showed normal LVEF, no significant valvular disease.   -Continue aspirin 81 mg daily, ticagrelor 90 mg twice daily.  Continue DAPT through 09/2023, aspirin indefinitely -Continue atorvastatin 80 mg daily -Continue Toprol-XL 25 mg daily -Refer to cardiac rehab  Hyperlipidemia: On atorvastatin 80 mg daily.  LDL 56 on 09/2022  Hypertension: On Toprol-XL 25 mg daily.  Appears controlled  CKD stage IV: Creatinine 2.66 on 10/11/2022.  Follows with nephrology  RTC in 3 months   Medication Adjustments/Labs and Tests Ordered: Current medicines are reviewed at length with the patient today.  Concerns regarding medicines are outlined above.  Orders Placed This Encounter  Procedures   AMB referral to cardiac rehabilitation   EKG 12-Lead   No orders of the defined types were placed in this encounter.   Patient Instructions  Medication Instructions:  Continue all current medications *If you need a refill on your cardiac medications before your next appointment, please call your pharmacy*   Lab Work: none  Testing/Procedures: none   Follow-Up: At [facility], you and your health needs are our priority.  As part of our continuing mission to  provide you with exceptional heart care, we have created designated Provider Care Teams.  These Care Teams include your primary Cardiologist (physician) and Advanced Practice Providers (APPs -  Physician Assistants and Nurse Practitioners) who all work together to provide you with the care you need, when you need it.  Your next appointment:   3 month(s)  Provider:   Little Ishikawa, MD    Other Instructions Referral to Cardiac Rehab   Signed, Little Ishikawa, MD  10/22/2022 10:17 PM    [facility]

## 2022-10-19 ENCOUNTER — Ambulatory Visit: Payer: No Typology Code available for payment source | Attending: Cardiology | Admitting: Cardiology

## 2022-10-19 ENCOUNTER — Encounter: Payer: Self-pay | Admitting: *Deleted

## 2022-10-19 ENCOUNTER — Telehealth (HOSPITAL_COMMUNITY): Payer: Self-pay

## 2022-10-19 ENCOUNTER — Encounter: Payer: Self-pay | Admitting: Cardiology

## 2022-10-19 VITALS — BP 126/84 | HR 56 | Ht 68.0 in | Wt 226.0 lb

## 2022-10-19 DIAGNOSIS — Z8679 Personal history of other diseases of the circulatory system: Secondary | ICD-10-CM | POA: Diagnosis not present

## 2022-10-19 DIAGNOSIS — E782 Mixed hyperlipidemia: Secondary | ICD-10-CM | POA: Diagnosis not present

## 2022-10-19 DIAGNOSIS — I10 Essential (primary) hypertension: Secondary | ICD-10-CM

## 2022-10-19 DIAGNOSIS — I2510 Atherosclerotic heart disease of native coronary artery without angina pectoris: Secondary | ICD-10-CM

## 2022-10-19 DIAGNOSIS — N184 Chronic kidney disease, stage 4 (severe): Secondary | ICD-10-CM

## 2022-10-19 NOTE — Telephone Encounter (Signed)
Attempted to call pt in regards to Cardiac rehab. LM on VM

## 2022-10-19 NOTE — Patient Instructions (Signed)
Medication Instructions:  Continue all current medications *If you need a refill on your cardiac medications before your next appointment, please call your pharmacy*   Lab Work: none  Testing/Procedures: none   Follow-Up: At [facility], you and your health needs are our priority.  As part of our continuing mission to provide you with exceptional heart care, we have created designated Provider Care Teams.  These Care Teams include your primary Cardiologist (physician) and Advanced Practice Providers (APPs -  Physician Assistants and Nurse Practitioners) who all work together to provide you with the care you need, when you need it.  Your next appointment:   3 month(s)  Provider:   Little Ishikawa, MD    Other Instructions Referral to Cardiac Rehab

## 2022-10-19 NOTE — Telephone Encounter (Signed)
Called [employer] insurance plan and spoke with Ellie H. She expressed that we are not a in-network location. We would have to go through all the steps to become in-network. [place] is in-network. Chase Walsh's plan also does not cover cardiac rehab. They only cover PCP, urgent care, and 3 specialist visits per year. The only outpatient service they cover is CT and MRI scans.    Ellie H/[place] 10/19/2022@1245  Ref# [id number]

## 2022-10-19 NOTE — Telephone Encounter (Signed)
Office referral recv'ed, printed and given to RN for review. 

## 2022-10-20 ENCOUNTER — Telehealth (HOSPITAL_COMMUNITY): Payer: Self-pay

## 2022-10-20 NOTE — Telephone Encounter (Signed)
Chase Walsh returned the VM that I had left yesterday. He is in the process of trying to get medicaid due to his " bad insurance plan" as he stated. He would like to do cardiac rehab. Explained scheduling process and went over insurance, patient verbalized understanding. Someone from our cardiac rehab staff will contact pt at a later time.

## 2022-10-22 ENCOUNTER — Inpatient Hospital Stay (HOSPITAL_COMMUNITY)
Admission: EM | Admit: 2022-10-22 | Discharge: 2022-10-25 | DRG: 388 | Disposition: A | Discharge status: Home / Self Care | Payer: No Typology Code available for payment source | Attending: Internal Medicine | Admitting: Internal Medicine

## 2022-10-22 ENCOUNTER — Emergency Department (HOSPITAL_COMMUNITY): Payer: No Typology Code available for payment source

## 2022-10-22 ENCOUNTER — Other Ambulatory Visit: Payer: Self-pay

## 2022-10-22 ENCOUNTER — Observation Stay (HOSPITAL_COMMUNITY): Payer: No Typology Code available for payment source

## 2022-10-22 ENCOUNTER — Encounter (HOSPITAL_COMMUNITY): Payer: Self-pay

## 2022-10-22 DIAGNOSIS — Z79899 Other long term (current) drug therapy: Secondary | ICD-10-CM

## 2022-10-22 DIAGNOSIS — K5730 Diverticulosis of large intestine without perforation or abscess without bleeding: Secondary | ICD-10-CM | POA: Diagnosis present

## 2022-10-22 DIAGNOSIS — Z955 Presence of coronary angioplasty implant and graft: Secondary | ICD-10-CM

## 2022-10-22 DIAGNOSIS — Z7902 Long term (current) use of antithrombotics/antiplatelets: Secondary | ICD-10-CM

## 2022-10-22 DIAGNOSIS — R070 Pain in throat: Secondary | ICD-10-CM | POA: Diagnosis present

## 2022-10-22 DIAGNOSIS — M109 Gout, unspecified: Secondary | ICD-10-CM | POA: Diagnosis present

## 2022-10-22 DIAGNOSIS — Z833 Family history of diabetes mellitus: Secondary | ICD-10-CM

## 2022-10-22 DIAGNOSIS — I129 Hypertensive chronic kidney disease with stage 1 through stage 4 chronic kidney disease, or unspecified chronic kidney disease: Secondary | ICD-10-CM | POA: Diagnosis present

## 2022-10-22 DIAGNOSIS — I2121 ST elevation (STEMI) myocardial infarction involving left circumflex coronary artery: Secondary | ICD-10-CM | POA: Diagnosis present

## 2022-10-22 DIAGNOSIS — Z888 Allergy status to other drugs, medicaments and biological substances status: Secondary | ICD-10-CM

## 2022-10-22 DIAGNOSIS — I2510 Atherosclerotic heart disease of native coronary artery without angina pectoris: Secondary | ICD-10-CM

## 2022-10-22 DIAGNOSIS — Z8739 Personal history of other diseases of the musculoskeletal system and connective tissue: Secondary | ICD-10-CM

## 2022-10-22 DIAGNOSIS — R112 Nausea with vomiting, unspecified: Secondary | ICD-10-CM | POA: Diagnosis not present

## 2022-10-22 DIAGNOSIS — N1832 Chronic kidney disease, stage 3b: Secondary | ICD-10-CM | POA: Diagnosis present

## 2022-10-22 DIAGNOSIS — R748 Abnormal levels of other serum enzymes: Secondary | ICD-10-CM

## 2022-10-22 DIAGNOSIS — K56600 Partial intestinal obstruction, unspecified as to cause: Principal | ICD-10-CM | POA: Diagnosis present

## 2022-10-22 DIAGNOSIS — E782 Mixed hyperlipidemia: Secondary | ICD-10-CM | POA: Diagnosis present

## 2022-10-22 DIAGNOSIS — E6609 Other obesity due to excess calories: Secondary | ICD-10-CM | POA: Diagnosis present

## 2022-10-22 DIAGNOSIS — I10 Essential (primary) hypertension: Secondary | ICD-10-CM | POA: Diagnosis present

## 2022-10-22 DIAGNOSIS — R188 Other ascites: Secondary | ICD-10-CM | POA: Diagnosis present

## 2022-10-22 DIAGNOSIS — K56609 Unspecified intestinal obstruction, unspecified as to partial versus complete obstruction: Secondary | ICD-10-CM | POA: Diagnosis not present

## 2022-10-22 DIAGNOSIS — Z841 Family history of disorders of kidney and ureter: Secondary | ICD-10-CM

## 2022-10-22 DIAGNOSIS — Z7982 Long term (current) use of aspirin: Secondary | ICD-10-CM

## 2022-10-22 DIAGNOSIS — Z6834 Body mass index (BMI) 34.0-34.9, adult: Secondary | ICD-10-CM

## 2022-10-22 DIAGNOSIS — Z8674 Personal history of sudden cardiac arrest: Secondary | ICD-10-CM

## 2022-10-22 DIAGNOSIS — Z8616 Personal history of COVID-19: Secondary | ICD-10-CM

## 2022-10-22 DIAGNOSIS — Z823 Family history of stroke: Secondary | ICD-10-CM

## 2022-10-22 DIAGNOSIS — Z803 Family history of malignant neoplasm of breast: Secondary | ICD-10-CM

## 2022-10-22 DIAGNOSIS — Z8261 Family history of arthritis: Secondary | ICD-10-CM

## 2022-10-22 LAB — COMPREHENSIVE METABOLIC PANEL
ALT: 36 U/L (ref 0–44)
AST: 32 U/L (ref 15–41)
Albumin: 4.2 g/dL (ref 3.5–5.0)
Alkaline Phosphatase: 99 U/L (ref 38–126)
Anion gap: 11 (ref 5–15)
BUN: 20 mg/dL (ref 6–20)
CO2: 24 mmol/L (ref 22–32)
Calcium: 10 mg/dL (ref 8.9–10.3)
Chloride: 102 mmol/L (ref 98–111)
Creatinine, Ser: 2.54 mg/dL — ABNORMAL HIGH (ref 0.61–1.24)
GFR, Estimated: 29 mL/min — ABNORMAL LOW (ref >60)
Glucose, Bld: 119 mg/dL — ABNORMAL HIGH (ref 70–99)
Potassium: 4.4 mmol/L (ref 3.5–5.1)
Sodium: 137 mmol/L (ref 135–145)
Total Bilirubin: 1.3 mg/dL — ABNORMAL HIGH (ref 0.3–1.2)
Total Protein: 8.3 g/dL — ABNORMAL HIGH (ref 6.5–8.1)

## 2022-10-22 LAB — CBC WITH DIFFERENTIAL/PLATELET
Abs Immature Granulocytes: 0.05 10*3/uL (ref 0.00–0.07)
Basophils Absolute: 0 10*3/uL (ref 0.0–0.1)
Basophils Relative: 0 %
Eosinophils Absolute: 0.2 10*3/uL (ref 0.0–0.5)
Eosinophils Relative: 2 %
HCT: 48.5 % (ref 39.0–52.0)
Hemoglobin: 15.4 g/dL (ref 13.0–17.0)
Immature Granulocytes: 1 %
Lymphocytes Relative: 12 %
Lymphs Abs: 1.1 10*3/uL (ref 0.7–4.0)
MCH: 28.2 pg (ref 26.0–34.0)
MCHC: 31.8 g/dL (ref 30.0–36.0)
MCV: 88.7 fL (ref 80.0–100.0)
Monocytes Absolute: 0.4 10*3/uL (ref 0.1–1.0)
Monocytes Relative: 4 %
Neutro Abs: 7.7 10*3/uL (ref 1.7–7.7)
Neutrophils Relative %: 81 %
Platelets: 299 10*3/uL (ref 150–400)
RBC: 5.47 MIL/uL (ref 4.22–5.81)
RDW: 12.1 % (ref 11.5–15.5)
WBC: 9.5 10*3/uL (ref 4.0–10.5)
nRBC: 0 % (ref 0.0–0.2)

## 2022-10-22 LAB — TROPONIN I (HIGH SENSITIVITY)
Troponin I (High Sensitivity): 14 ng/L (ref <18)
Troponin I (High Sensitivity): 13 ng/L (ref <18)

## 2022-10-22 LAB — LIPASE, BLOOD: Lipase: 122 U/L — ABNORMAL HIGH (ref 11–51)

## 2022-10-22 LAB — HIV ANTIBODY (ROUTINE TESTING W REFLEX): HIV Screen 4th Generation wRfx: NONREACTIVE

## 2022-10-22 LAB — MAGNESIUM: Magnesium: 2.3 mg/dL (ref 1.7–2.4)

## 2022-10-22 MED ORDER — ONDANSETRON 4 MG PO TBDP
4.0000 mg | ORAL_TABLET | Freq: Once | ORAL | Status: AC
  Administered 2022-10-22: 4 mg via ORAL
  Filled 2022-10-22: qty 1

## 2022-10-22 MED ORDER — ASPIRIN 81 MG PO CHEW
324.0000 mg | CHEWABLE_TABLET | Freq: Once | ORAL | Status: AC
  Administered 2022-10-22: 324 mg via ORAL
  Filled 2022-10-22: qty 4

## 2022-10-22 MED ORDER — HYDROMORPHONE HCL 1 MG/ML IJ SOLN
1.0000 mg | Freq: Once | INTRAMUSCULAR | Status: AC
  Administered 2022-10-23: 1 mg via INTRAVENOUS
  Filled 2022-10-22: qty 1

## 2022-10-22 MED ORDER — SODIUM CHLORIDE 0.9 % IV SOLN: INTRAVENOUS | Status: AC

## 2022-10-22 MED ORDER — LIDOCAINE HCL URETHRAL/MUCOSAL 2 % EX GEL
1.0000 | Freq: Once | CUTANEOUS | Status: AC
  Administered 2022-10-22: 1 via TOPICAL
  Filled 2022-10-22: qty 11

## 2022-10-22 MED ORDER — ONDANSETRON HCL 4 MG PO TABS: 4.0000 mg | ORAL_TABLET | Freq: Four times a day (QID) | ORAL | Status: DC | PRN

## 2022-10-22 MED ORDER — ONDANSETRON HCL 4 MG/2ML IJ SOLN: 4.0000 mg | Freq: Four times a day (QID) | INTRAMUSCULAR | Status: DC | PRN

## 2022-10-22 MED ORDER — SODIUM CHLORIDE 0.9 % IV SOLN: INTRAVENOUS | Status: DC

## 2022-10-22 MED ORDER — HYDROMORPHONE HCL 1 MG/ML IJ SOLN
1.0000 mg | Freq: Once | INTRAMUSCULAR | Status: AC
  Administered 2022-10-22: 1 mg via INTRAVENOUS
  Filled 2022-10-22: qty 1

## 2022-10-22 MED ORDER — METOPROLOL TARTRATE 5 MG/5ML IV SOLN: 5.0000 mg | Freq: Four times a day (QID) | INTRAVENOUS | Status: DC | PRN

## 2022-10-22 MED ORDER — MORPHINE SULFATE (PF) 2 MG/ML IV SOLN
2.0000 mg | INTRAVENOUS | Status: DC | PRN
  Administered 2022-10-22: 2 mg via INTRAVENOUS
  Filled 2022-10-22: qty 1

## 2022-10-22 NOTE — ED Provider Notes (Signed)
Patient signed out to me at 3 PM.  Awaiting CT abdomen and pelvis.  Concern for enteritis versus pancreatitis versus bowel obstruction.  Cardiac workup unremarkable.  Here with abdominal pain diarrhea.  Per radiology report patient does appear to have a small bowel obstruction.  He still feeling very bloated not passing gas not having bowel movements.  Ultimately will put an NG tube I think that will help him.  I will talk to general surgery, Dr. Hillery Hunter to make them aware the patient is here but will admit to hospitalist for further care.  This chart was dictated using voice recognition software.  Despite best efforts to proofread,  errors can occur which can change the documentation meaning.    Virgina Norfolk, DO 10/22/22 1705

## 2022-10-22 NOTE — ED Provider Triage Note (Signed)
Emergency Medicine Provider Triage Evaluation Note  Dontrel Spraker , a 52 y.o. male  was evaluated in triage.  Pt complains of epigastric pain w/associated N/V since last night. Pain is sharp and stabbing. Initially had a little diarrhea but none since then. Reports diaphoresis with vomiting. Denies runny nose, cough, sore throat.   MI less than 1 month ago presented as gas symptoms.  Review of Systems  Positive: Abd pain, N/V Negative: fevers  Physical Exam  There were no vitals taken for this visit. Gen:   Awake, no distress   Resp:  Normal effort  MSK:   Moves extremities without difficulty  Other:  Ttp of epigastric region, no guarding.  Medical Decision Making  Medically screening exam initiated at 1:14 PM.  Appropriate orders placed.  Davonta Posthuma was informed that the remainder of the evaluation will be completed by another provider, this initial triage assessment does not replace that evaluation, and the importance of remaining in the ED until their evaluation is complete.   Pete Pelt, [place] 10/22/22 1318

## 2022-10-22 NOTE — ED Notes (Signed)
Attempted to call report and called numerous times from work phone and personal cell phone with no answer.  Patient has had a bed for so tech is taking patient upstairs.

## 2022-10-22 NOTE — H&P (Signed)
History and Physical    Patient: Chase Walsh DOB: December 11, 1970 DOA: 10/22/2022 DOS: the patient was seen and examined on 10/22/2022 PCP: [name], FNP  Patient coming from: Home - lives with his wife   Chief Complaint: stomach pain and vomiting  HPI: Chase Walsh is a 51 y.o. male with medical history significant of CAD s/p VF arrest secondary to STEMI in 09/2022 s/p DES to distal Lcx, CKD stage 3, HTN, HLD, gout who presented to ED with complaints of stomach pain and vomiting that started last night.  Pain is epigastric/superior periumbilical area and does not radiate. Pain rated as a 9/10 and is sharp in nature.  Sitting up makes it better. He  has not had any gas and last BM was yesterday afternoon.  He had diarrhea twice before the start of the pain/vomiting.  He started vomiting last night and  has had 3 episodes of vomiting.  No hematemesis .  Hx of right inguinal hernia surgery as a child and revision later on. Hx of colonoscopy in 2018 secondary to lower GIB: normal except for diverticulosis. 10 year recall.   Denies any fever, but has had chills and sweats. No vision changes/headaches, chest pain or palpitations,+ shortness of breath, no cough,  dysuria or leg swelling.    He does not smoke or drink alcohol  ER Course:  vitals: afebrile, bp: 166/111, HR :78, RR 16, oxygen: 100%RA Pertinent labs: lipase: 122, creatinine: 2.54,  CXR:  no acute disease CT abdo/pelvis: low grade SBO with transition point within the right hemiabdomen. Small volume ascites, likely reactive.   In ED: NG tube ordered, ASA given. General surgery consulted and TRH asked to admit.     Review of Systems: As mentioned in the history of present illness. All other systems reviewed and are negative. Past Medical History:  Diagnosis Date   Allergy    Class 1 obesity due to excess calories with serious comorbidity and body mass index (BMI) of 33.0 to 33.9 in adult 07/08/2018    Diverticulosis    Fall 11/21/2018   Gout    Hematochezia 04/21/2016   History of COVID-19 02/05/2019   Hypertension    Lower GI bleed    Myocardial infarction (HCC) 10/01/22   Vitiligo    Past Surgical History:  Procedure Laterality Date   COLONOSCOPY N/A 04/22/2016   Procedure: COLONOSCOPY;  Surgeon: Sherrilyn Rist, MD;  Location: [facility] ENDOSCOPY;  Service: Endoscopy;  Laterality: N/A;   HERNIA REPAIR     Social History:  reports that he has never smoked. He has never used smokeless tobacco. He reports that he does not drink alcohol and does not use drugs.  Allergies  Allergen Reactions   Allopurinol Rash    Family History  Problem Relation Age of Onset   Breast cancer Mother    Arthritis Mother    Cancer Mother    Obesity Mother    Cancer Paternal Uncle        maybe colon   Stroke Father    Diabetes Sister    Obesity Sister    Stroke Sister    Diabetes Brother    Kidney disease Brother    Obesity Brother    Kidney disease Brother    Inflammatory bowel disease Neg Hx     Prior to Admission medications   Medication Sig Start Date End Date Taking? Authorizing Provider  aspirin 81 MG chewable tablet Chew 1 tablet by mouth daily. 10/04/22   [provider]  atorvastatin (LIPITOR) 80 MG tablet Take 1 tablet by mouth daily. 10/04/22 10/05/23  [provider]  fluocinonide (LIDEX) 0.05 % external solution Apply 1 application topically as needed.    [provider]  metoprolol succinate (TOPROL-XL) 25 MG 24 hr tablet Take 25 mg by mouth daily. 10/04/22   [provider]  nitroGLYCERIN (NITROSTAT) 0.4 MG SL tablet Place 0.4 mg under the tongue every 5 (five) minutes as needed for chest pain. 10/04/22 11/03/22  [provider]  ticagrelor (BRILINTA) 90 MG TABS tablet Take 90 mg by mouth 2 (two) times daily. 10/04/22   [provider]    Physical Exam: Vitals:   10/22/22 1545 10/22/22 1626 10/22/22 1630 10/22/22 1645  BP: (!)  136/105  (!) 131/97 (!) 141/105  Pulse: 80  77 72  Resp: 18  16 16   Temp:    98.7 F (37.1 C)  TempSrc:    Oral  SpO2: 93%  94% 97%  Weight:  102.5 kg    Height:  5\' 8"  (1.727 m)     General:  Appears calm and comfortable and is in NAD Eyes:  PERRL, EOMI, normal lids, iris ENT:  grossly normal hearing, lips & tongue, mmm; appropriate dentition Neck:  no LAD, masses or thyromegaly; no carotid bruits Cardiovascular:  RRR, no m/r/g. No LE edema.  Respiratory:   CTA bilaterally with no wheezes/rales/rhonchi.  Normal respiratory effort. Abdomen:  soft, TTP in epigastric/periumbilical area. No BS, no rebound or guarding  Back:   normal alignment, no CVAT Skin:  no rash or induration seen on limited exam Musculoskeletal:  grossly normal tone BUE/BLE, good ROM, no bony abnormality Lower extremity:  No LE edema.  Limited foot exam with no ulcerations.  2+ distal pulses. Psychiatric:  grossly normal mood and affect, speech fluent and appropriate, AOx3 Neurologic:  CN 2-12 grossly intact, moves all extremities in coordinated fashion, sensation intact   Radiological Exams on Admission: Independently reviewed - see discussion in A/P where applicable  CT ABDOMEN PELVIS WO CONTRAST  Result Date: 10/22/2022 CLINICAL DATA:  Abdominal pain.  Elevated serum lipase EXAM: CT ABDOMEN AND PELVIS WITHOUT CONTRAST TECHNIQUE: Multidetector CT imaging of the abdomen and pelvis was performed following the standard protocol without IV contrast. RADIATION DOSE REDUCTION: This exam was performed according to the departmental dose-optimization program which includes automated exposure control, adjustment of the mA and/or kV according to patient size and/or use of iterative reconstruction technique. COMPARISON:  01/24/2019 FINDINGS: Lower chest: Coronary artery atherosclerosis.  Gynecomastia. Hepatobiliary: Unremarkable unenhanced appearance of the liver. No focal liver lesion identified. Gallbladder within normal  limits. No hyperdense gallstone. No biliary dilatation. Pancreas: Unremarkable. No pancreatic ductal dilatation or surrounding inflammatory changes. Spleen: Normal in size without focal abnormality. Adrenals/Urinary Tract: Adrenal glands are unremarkable. Kidneys are normal, without renal calculi, focal lesion, or hydronephrosis. Bladder is unremarkable. Stomach/Bowel: Stomach is within normal limits. There are several fluid-filled loops of small bowel throughout the abdomen, some of which are mildly dilated. Transition point to decompressed bowel within the mid right hemiabdomen with fecalization of the small bowel content immediately upstream from the site of transition (series 3, images 44-46). Distal small bowel is decompressed. Normal appendix in the right lower quadrant. Diverticulosis throughout the colon. No focal colonic wall thickening or inflammatory changes. Vascular/Lymphatic: Aortic atherosclerosis. No enlarged abdominal or pelvic lymph nodes. Reproductive: Prostate is unremarkable. Other: Small volume ascites, likely reactive. No organized abdominopelvic fluid collections. No pneumoperitoneum. No abdominal wall hernia. Musculoskeletal:  Chronic findings of bilateral sacroiliitis. No new or acute bony findings. IMPRESSION: 1. Low-grade small-bowel obstruction with transition point within the mid right hemiabdomen. 2. Small volume ascites, likely reactive. 3. Colonic diverticulosis without evidence of acute diverticulitis. 4. Chronic findings of bilateral sacroiliitis. 5. Aortic and coronary artery atherosclerosis (ICD10-I70.0). Electronically Signed   By: Duanne Guess D.O.   On: 10/22/2022 16:32   DG Chest 2 View  Result Date: 10/22/2022 CLINICAL DATA:  Epigastric pain. EXAM: CHEST - 2 VIEW COMPARISON:  None Available. FINDINGS: Bilateral lung fields are clear. Bilateral costophrenic angles are clear. Normal cardio-mediastinal silhouette. No acute osseous abnormalities. No free air under the  domes of diaphragm. The soft tissues are within normal limits. IMPRESSION: No active cardiopulmonary disease. Electronically Signed   By: Jules Schick M.D.   On: 10/22/2022 14:57    EKG: Independently reviewed.  NSR with rate 79; nonspecific ST changes with no evidence of acute ischemia   Labs on Admission: I have personally reviewed the available labs and imaging studies at the time of the admission.  Pertinent labs:   lipase: 122,  creatinine: 2.54  Assessment and Plan: Principal Problem:   Small bowel obstruction (HCC) Active Problems:   Elevated lipase   CAD S/P percutaneous coronary angioplasty   CKD stage G3b/A3, GFR 30-44 and albumin creatinine ratio >300 mg/g (HCC)   Essential hypertension   Mixed hyperlipidemia    Assessment and Plan: * Small bowel obstruction (HCC) 52 year old male presenting with complaints of acute onset of abdominal pain and vomiting yesterday evening found to have a low grade SBO with transition point in the right hemi-abdomen.  -obs to tele  -no clinical findings for emergent surgery at this time  -general surgery consulted and will follow  -no SIRS criteria, afebrile, no leukocytosis  -NG tube protocol  -Gentle IV fluid hydration -Pain medication -encourage ambulation -Monitor electrolytes -hx of right inguinal hernia repair as a child with revision as an adult. No other abdominal surgeries -colonoscopy in 2018. Diverticulosis otherwise wnl. Recall 10 years    Elevated lipase Pancreas wnl on imaging Likely secondary to SBO IVF and trend   CAD S/P percutaneous coronary angioplasty Hx of VF arrest secondary to STEMI in 09/2022 s/p DES to distal Lcx On DAPT with ASA and brillinta-last took this AM, but thinks he may have thrown up  Continue lipitor, toprol-xl  Tele x 24 hours  Need to start back DAPT as soon as possible with recent stent placement   CKD stage G3b/A3, GFR 30-44 and albumin creatinine ratio >300 mg/g (HCC) Baseline  creatinine appears to be 2.3-2.4 (3.12 at hospital d/c on 10/2)  Slightly bumped likely pre renal secondary to exogenous losses from vomiting  Will do gentle IVF while NPO Followed by nephrology  Strict I/O Check UA  Trend   Essential hypertension PRN labetalol while NPO   Mixed hyperlipidemia Continue high intensity statin when no longer NPO     Advance Care Planning:   Code Status: Full Code   Consults: general surgery: Dr Hillery Hunter  DVT Prophylaxis: SCDs  Family Communication: wife at bedside   Severity of Illness: The appropriate patient status for this patient is OBSERVATION. Observation status is judged to be reasonable and necessary in order to provide the required intensity of service to ensure the patient's safety. The patient's presenting symptoms, physical exam findings, and initial radiographic and laboratory data in the context of their medical condition is felt to place them at decreased risk for  further clinical deterioration. Furthermore, it is anticipated that the patient will be medically stable for discharge from the hospital within 2 midnights of admission.   Author: Orland Mustard, MD 10/22/2022 6:08 PM  For on call review [email or web address].

## 2022-10-22 NOTE — Assessment & Plan Note (Signed)
PRN labetalol while NPO

## 2022-10-22 NOTE — ED Triage Notes (Signed)
Patient complains of abdominal cramping that started yesterday initially with diarrhea, that has since resolved and now nausea and vomiting. Chills with same.

## 2022-10-22 NOTE — Assessment & Plan Note (Signed)
Pancreas wnl on imaging Likely secondary to SBO IVF and trend

## 2022-10-22 NOTE — Assessment & Plan Note (Signed)
Continue high intensity statin when no longer NPO

## 2022-10-22 NOTE — ED Notes (Signed)
Xray at bedside confirming placement for NG tube

## 2022-10-22 NOTE — Assessment & Plan Note (Addendum)
Hx of VF arrest secondary to STEMI in 09/2022 s/p DES to distal Lcx On DAPT with ASA and brillinta-last took this AM, but thinks he may have thrown up  Continue lipitor, toprol-xl  Tele x 24 hours  Need to start back DAPT as soon as possible with recent stent placement

## 2022-10-22 NOTE — Consult Note (Signed)
Chase Walsh May 06, 1970  [id number].    Requesting MD: Lockie Mola Chief Complaint/Reason for Consult: SBO  HPI:  52 y/o M w/ a hx of HTN, CKD, and CAD s/p recent VF arrest 2/2 STEMI s/p DES 10/02/22 who presents with abdominal pain, nausea, and emesis x 1 day.  He has been taking Miralax since discharge after his STEMI and reports daily Bms.  Yesterday he had sudden onset and significant abdominal pain followed by diarrhea.  Since then he has not had a BM or flatus but has continued to have pain. CT was performed and shows mild small bowel dilation with possible pSBO in the right mid-abdomen.  He does have a history of GIB and in 2018 he underwent a colonoscopy that showed diverticula but was otherwise normal.  WBC 10.  He is AF and HDS.    He is on Brilinta and ASA  ROS: Review of Systems  Constitutional: Negative.   HENT: Negative.    Eyes: Negative.   Respiratory: Negative.    Cardiovascular: Negative.   Gastrointestinal:  Positive for abdominal pain and nausea.  Genitourinary: Negative.   Musculoskeletal: Negative.   Skin: Negative.   Neurological: Negative.   Endo/Heme/Allergies: Negative.   Psychiatric/Behavioral: Negative.      Family History  Problem Relation Age of Onset   Breast cancer Mother    Arthritis Mother    Cancer Mother    Obesity Mother    Cancer Paternal Uncle        maybe colon   Stroke Father    Diabetes Sister    Obesity Sister    Stroke Sister    Diabetes Brother    Kidney disease Brother    Obesity Brother    Kidney disease Brother    Inflammatory bowel disease Neg Hx     Past Medical History:  Diagnosis Date   Allergy    Class 1 obesity due to excess calories with serious comorbidity and body mass index (BMI) of 33.0 to 33.9 in adult 07/08/2018   Diverticulosis    Fall 11/21/2018   Gout    Hematochezia 04/21/2016   History of COVID-19 02/05/2019   Hypertension    Lower GI bleed    Myocardial infarction (HCC) 10/01/22   Vitiligo      Past Surgical History:  Procedure Laterality Date   COLONOSCOPY N/A 04/22/2016   Procedure: COLONOSCOPY;  Surgeon: Sherrilyn Rist, MD;  Location: [facility] ENDOSCOPY;  Service: Endoscopy;  Laterality: N/A;   HERNIA REPAIR      Social History:  reports that he has never smoked. He has never used smokeless tobacco. He reports that he does not drink alcohol and does not use drugs.  Allergies:  Allergies  Allergen Reactions   Allopurinol Rash    (Not in a hospital admission)   Physical Exam: Blood pressure (!) 141/105, pulse 72, temperature 98.7 F (37.1 C), temperature source Oral, resp. rate 16, height 5\' 8"  (1.727 m), weight 102.5 kg, SpO2 97%. Gen: male resting in bed, NAD Abd: soft, non-distended, mild TTP in the mid-abdomen, no rebound/guading, no peritoneal signs Neuro: moving all extremities  Results for orders placed or performed during the hospital encounter of 10/22/22 (from the past 48 hour(s))  Lipase, blood     Status: Abnormal   Collection Time: 10/22/22  1:21 PM  Result Value Ref Range   Lipase 122 (H) 11 - 51 U/L    Comment: Performed at [facility], [address],  [place], [place] [address]  CBC with Differential     Status: None   Collection Time: 10/22/22  1:21 PM  Result Value Ref Range   WBC 9.5 4.0 - 10.5 K/uL   RBC 5.47 4.22 - 5.81 MIL/uL   Hemoglobin 15.4 13.0 - 17.0 g/dL   HCT 30.8 65.7 - 84.6 %   MCV 88.7 80.0 - 100.0 fL   MCH 28.2 26.0 - 34.0 pg   MCHC 31.8 30.0 - 36.0 g/dL   RDW 96.2 95.2 - 84.1 %   Platelets 299 150 - 400 K/uL   nRBC 0.0 0.0 - 0.2 %   Neutrophils Relative % 81 %   Neutro Abs 7.7 1.7 - 7.7 K/uL   Lymphocytes Relative 12 %   Lymphs Abs 1.1 0.7 - 4.0 K/uL   Monocytes Relative 4 %   Monocytes Absolute 0.4 0.1 - 1.0 K/uL   Eosinophils Relative 2 %   Eosinophils Absolute 0.2 0.0 - 0.5 K/uL   Basophils Relative 0 %   Basophils Absolute 0.0 0.0 - 0.1 K/uL   Immature Granulocytes 1 %   Abs Immature Granulocytes 0.05  0.00 - 0.07 K/uL    Comment: Performed at [facility], [address], [place], [place] [address]  Comprehensive metabolic panel     Status: Abnormal   Collection Time: 10/22/22  1:21 PM  Result Value Ref Range   Sodium 137 135 - 145 mmol/L   Potassium 4.4 3.5 - 5.1 mmol/L   Chloride 102 98 - 111 mmol/L   CO2 24 22 - 32 mmol/L   Glucose, Bld 119 (H) 70 - 99 mg/dL    Comment: Glucose reference range applies only to samples taken after fasting for at least 8 hours.   BUN 20 6 - 20 mg/dL   Creatinine, Ser 1.02 (H) 0.61 - 1.24 mg/dL   Calcium 72.5 8.9 - 36.6 mg/dL   Total Protein 8.3 (H) 6.5 - 8.1 g/dL   Albumin 4.2 3.5 - 5.0 g/dL   AST 32 15 - 41 U/L   ALT 36 0 - 44 U/L   Alkaline Phosphatase 99 38 - 126 U/L   Total Bilirubin 1.3 (H) 0.3 - 1.2 mg/dL   GFR, Estimated 29 (L) >60 mL/min    Comment: (NOTE) Calculated using the CKD-EPI Creatinine Equation (2021)    Anion gap 11 5 - 15    Comment: Performed at [facility], [address], [place], [place] [address]  Troponin I (High Sensitivity)     Status: None   Collection Time: 10/22/22  1:21 PM  Result Value Ref Range   Troponin I (High Sensitivity) 14 <18 ng/L    Comment: (NOTE) Elevated high sensitivity troponin I (hsTnI) values and significant  changes across serial measurements may suggest ACS but many other  chronic and acute conditions are known to elevate hsTnI results.  Refer to the "Links" section for chest pain algorithms and additional  guidance. Performed at [facility], [address], [place], [place] [address]   Troponin I (High Sensitivity)     Status: None   Collection Time: 10/22/22  3:48 PM  Result Value Ref Range   Troponin I (High Sensitivity) 13 <18 ng/L    Comment: (NOTE) Elevated high sensitivity troponin I (hsTnI) values and significant  changes across serial measurements may suggest ACS but many other  chronic and acute conditions are known to elevate hsTnI results.  Refer to  the "Links" section for chest  pain algorithms and additional  guidance. Performed at [facility], [address], [place], [place] [address]    CT ABDOMEN PELVIS WO CONTRAST  Result Date: 10/22/2022 CLINICAL DATA:  Abdominal pain.  Elevated serum lipase EXAM: CT ABDOMEN AND PELVIS WITHOUT CONTRAST TECHNIQUE: Multidetector CT imaging of the abdomen and pelvis was performed following the standard protocol without IV contrast. RADIATION DOSE REDUCTION: This exam was performed according to the departmental dose-optimization program which includes automated exposure control, adjustment of the mA and/or kV according to patient size and/or use of iterative reconstruction technique. COMPARISON:  01/24/2019 FINDINGS: Lower chest: Coronary artery atherosclerosis.  Gynecomastia. Hepatobiliary: Unremarkable unenhanced appearance of the liver. No focal liver lesion identified. Gallbladder within normal limits. No hyperdense gallstone. No biliary dilatation. Pancreas: Unremarkable. No pancreatic ductal dilatation or surrounding inflammatory changes. Spleen: Normal in size without focal abnormality. Adrenals/Urinary Tract: Adrenal glands are unremarkable. Kidneys are normal, without renal calculi, focal lesion, or hydronephrosis. Bladder is unremarkable. Stomach/Bowel: Stomach is within normal limits. There are several fluid-filled loops of small bowel throughout the abdomen, some of which are mildly dilated. Transition point to decompressed bowel within the mid right hemiabdomen with fecalization of the small bowel content immediately upstream from the site of transition (series 3, images 44-46). Distal small bowel is decompressed. Normal appendix in the right lower quadrant. Diverticulosis throughout the colon. No focal colonic wall thickening or inflammatory changes. Vascular/Lymphatic: Aortic atherosclerosis. No enlarged abdominal or pelvic lymph nodes. Reproductive: Prostate is unremarkable. Other: Small  volume ascites, likely reactive. No organized abdominopelvic fluid collections. No pneumoperitoneum. No abdominal wall hernia. Musculoskeletal: Chronic findings of bilateral sacroiliitis. No new or acute bony findings. IMPRESSION: 1. Low-grade small-bowel obstruction with transition point within the mid right hemiabdomen. 2. Small volume ascites, likely reactive. 3. Colonic diverticulosis without evidence of acute diverticulitis. 4. Chronic findings of bilateral sacroiliitis. 5. Aortic and coronary artery atherosclerosis (ICD10-I70.0). Electronically Signed   By: Duanne Guess D.O.   On: 10/22/2022 16:32   DG Chest 2 View  Result Date: 10/22/2022 CLINICAL DATA:  Epigastric pain. EXAM: CHEST - 2 VIEW COMPARISON:  None Available. FINDINGS: Bilateral lung fields are clear. Bilateral costophrenic angles are clear. Normal cardio-mediastinal silhouette. No acute osseous abnormalities. No free air under the domes of diaphragm. The soft tissues are within normal limits. IMPRESSION: No active cardiopulmonary disease. Electronically Signed   By: Jules Schick M.D.   On: 10/22/2022 14:57    Assessment/Plan 52 y/o M w/ a hx HTN, CKD, and CAD s/p recent VF arrest 2/2 STEMI s/p DES 10/02/22 who presents with abdominal pain, nausea, and emesis x 1 day and has a CT c/f possible pSBO  - No indication for urgent surgical intervention.  He is not toxic on exam and CT does not show signs of bowel compromise - Admit to medicine - NGT to LWS - Continue DAPT.  He is high risk for any intervention given his recent cardiac arrest/stent - Surgery will continue to follow  I reviewed last 24 h vitals and pain scores, last 24 h labs and trends, and last 24 h imaging results.  T[facility] 10/22/2022, 5:22 PM Please see Amion for pager number during day hours 7:00am-4:30pm or 7:00am -11:30am on weekends

## 2022-10-22 NOTE — ED Notes (Signed)
Patient transported to CT 

## 2022-10-22 NOTE — ED Provider Notes (Signed)
[place] EMERGENCY DEPARTMENT AT [facility] Provider Note   CSN: [id number] Arrival date & time: 10/22/22  1305     History  No chief complaint on file.   Lavarr Durnin is a 52 y.o. male.  HPI 52 year old male with a history of obesity, hypertension, gout, CKD, and an MI with arrest last month presents with abdominal pain.  Pain started last night and has had vomiting as well as a little bit of diarrhea.  No fevers but has had some chills and sweats.  No chest pain though he was presenting primarily due to concerned because when he had his MI last month he had more of a gas pain.  However this is sharp and different.  This is epigastric but does not radiate anywhere.  No shortness of breath except when the pain becomes severe.  He has been unable to swallow anything due to vomiting, even water.  Home Medications Prior to Admission medications   Medication Sig Start Date End Date Taking? Authorizing Provider  aspirin 81 MG chewable tablet Chew 1 tablet by mouth daily. 10/04/22   [provider]  atorvastatin (LIPITOR) 80 MG tablet Take 1 tablet by mouth daily. 10/04/22 10/05/23  [provider]  fluocinonide (LIDEX) 0.05 % external solution Apply 1 application topically as needed.    [provider]  metoprolol succinate (TOPROL-XL) 25 MG 24 hr tablet Take 25 mg by mouth daily. 10/04/22   [provider]  nitroGLYCERIN (NITROSTAT) 0.4 MG SL tablet Place 0.4 mg under the tongue every 5 (five) minutes as needed for chest pain. 10/04/22 11/03/22  [provider]  ticagrelor (BRILINTA) 90 MG TABS tablet Take 90 mg by mouth 2 (two) times daily. 10/04/22   [provider]      Allergies    Allopurinol    Review of Systems   Review of Systems  Physical Exam Updated Vital Signs BP (!) 155/104   Pulse 78   Temp 98.8 F (37.1 C)   Resp 14   SpO2 91%  Physical Exam  ED Results / Procedures / Treatments   Labs (all labs  ordered are listed, but only abnormal results are displayed) Labs Reviewed  LIPASE, BLOOD - Abnormal; Notable for the following components:      Result Value   Lipase 122 (*)    All other components within normal limits  COMPREHENSIVE METABOLIC PANEL - Abnormal; Notable for the following components:   Glucose, Bld 119 (*)    Creatinine, Ser 2.54 (*)    Total Protein 8.3 (*)    Total Bilirubin 1.3 (*)    GFR, Estimated 29 (*)    All other components within normal limits  CBC WITH DIFFERENTIAL/PLATELET  TROPONIN I (HIGH SENSITIVITY)  TROPONIN I (HIGH SENSITIVITY)    EKG EKG Interpretation Date/Time:  Sunday October 22 2022 13:26:41 EDT Ventricular Rate:  79 PR Interval:  168 QRS Duration:  78 QT Interval:  378 QTC Calculation: 433 R Axis:   16  Text Interpretation: Normal sinus rhythm Inferior infarct , age undetermined T wave abnormality, consider lateral ischemia  T wave changes similar to Oct 19 2022 Confirmed by Pricilla Loveless [phone]) on 10/22/2022 1:56:39 PM  Radiology DG Chest 2 View  Result Date: 10/22/2022 CLINICAL DATA:  Epigastric pain. EXAM: CHEST - 2 VIEW COMPARISON:  None Available. FINDINGS: Bilateral lung fields are clear. Bilateral costophrenic angles are clear. Normal cardio-mediastinal silhouette. No acute osseous abnormalities. No free air under the domes of diaphragm.  The soft tissues are within normal limits. IMPRESSION: No active cardiopulmonary disease. Electronically Signed   By: Jules Schick M.D.   On: 10/22/2022 14:57    Procedures Procedures    Medications Ordered in ED Medications  aspirin chewable tablet 324 mg (324 mg Oral Given 10/22/22 1326)  ondansetron (ZOFRAN-ODT) disintegrating tablet 4 mg (4 mg Oral Given 10/22/22 1326)  HYDROmorphone (DILAUDID) injection 1 mg (1 mg Intravenous Given 10/22/22 1427)    ED Course/ Medical Decision Making/ A&P                                 Medical Decision Making Amount and/or Complexity of Data  Reviewed Labs:     Details: Normal troponin, mildly elevated lipase at 122.  CKD is stable. Radiology: ordered and independent interpretation performed.    Details: No CHF ECG/medicine tests: ordered and independent interpretation performed.    Details: Unchanged from earlier in the month.  Risk Prescription drug management.   Patient did have a recent MI but the symptoms are a little different and he is point tender in the epigastrium.  No back pain or radiation to the back.  I think dissection is less likely.  Could be pancreatitis with his mildly elevated lipase.  He was given IV Dilaudid for pain.  CT currently pending. Care transferred to Dr. Lockie Mola.        Final Clinical Impression(s) / ED Diagnoses Final diagnoses:  None    Rx / DC Orders ED Discharge Orders     None         Pricilla Loveless, MD 10/22/22 1526

## 2022-10-22 NOTE — Assessment & Plan Note (Addendum)
52 year old male presenting with complaints of acute onset of abdominal pain and vomiting yesterday evening found to have a low grade SBO with transition point in the right hemi-abdomen.  -obs to tele  -no clinical findings for emergent surgery at this time  -general surgery consulted and will follow  -no SIRS criteria, afebrile, no leukocytosis  -NG tube protocol  -Gentle IV fluid hydration -Pain medication -encourage ambulation -Monitor electrolytes -hx of right inguinal hernia repair as a child with revision as an adult. No other abdominal surgeries -colonoscopy in 2018. Diverticulosis otherwise wnl. Recall 10 years

## 2022-10-22 NOTE — Assessment & Plan Note (Addendum)
Baseline creatinine appears to be 2.3-2.4 (3.12 at hospital d/c on 10/2)  Slightly bumped likely pre renal secondary to exogenous losses from vomiting  Will do gentle IVF while NPO Followed by nephrology  Strict I/O Check UA  Trend

## 2022-10-22 NOTE — ED Notes (Signed)
NG placement confirmed by XR, placed on low intermittent suction at . [place] securing nose clip placed.

## 2022-10-22 NOTE — ED Notes (Signed)
ED TO INPATIENT HANDOFF REPORT  ED Nurse Name and Phone #: Dahlia Client [phone]  S Name/Age/Gender Chase Walsh 52 y.o. male Room/Bed: 034C/034C  Code Status   Code Status: Full Code  Home/SNF/Other Home Patient oriented to: self, place, time, and situation Is this baseline? Yes   Triage Complete: Triage complete  Chief Complaint Small bowel obstruction (HCC) [K56.609]  Triage Note Patient complains of abdominal cramping that started yesterday initially with diarrhea, that has since resolved and now nausea and vomiting. Chills with same.   Allergies Allergies  Allergen Reactions   Allopurinol Rash    Level of Care/Admitting Diagnosis ED Disposition     ED Disposition  Admit   Condition  --   Comment  Hospital Area: [facility] [[id number]]  Level of Care: Telemetry Medical [104]  May place patient in observation at [facility] or [facility] if equivalent level of care is available:: Yes  Covid Evaluation: Asymptomatic - no recent exposure (last 10 days) testing not required  Diagnosis: Small bowel obstruction [facility]) [[id number]]  Admitting Physician: Orland Mustard [[id number]]  Attending Physician: Orland Mustard [[id number]]          B Medical/Surgery History Past Medical History:  Diagnosis Date   Allergy    Class 1 obesity due to excess calories with serious comorbidity and body mass index (BMI) of 33.0 to 33.9 in adult 07/08/2018   Diverticulosis    Fall 11/21/2018   Gout    Hematochezia 04/21/2016   History of COVID-19 02/05/2019   Hypertension    Lower GI bleed    Myocardial infarction (HCC) 10/01/22   Vitiligo    Past Surgical History:  Procedure Laterality Date   COLONOSCOPY N/A 04/22/2016   Procedure: COLONOSCOPY;  Surgeon: Sherrilyn Rist, MD;  Location: [facility] ENDOSCOPY;  Service: Endoscopy;  Laterality: N/A;   HERNIA REPAIR       A IV Location/Drains/Wounds Patient Lines/Drains/Airways Status     Active Line/Drains/Airways      Name Placement date Placement time Site Days   Peripheral IV 10/22/22 20 G Left Antecubital 10/22/22  1426  Antecubital  less than 1   NG/OG Vented/Dual Lumen 16 Fr. Left nare 10/22/22  1800  Left nare  less than 1            Intake/Output Last 24 hours No intake or output data in the 24 hours ending 10/22/22 1808  Labs/Imaging Results for orders placed or performed during the hospital encounter of 10/22/22 (from the past 48 hour(s))  Lipase, blood     Status: Abnormal   Collection Time: 10/22/22  1:21 PM  Result Value Ref Range   Lipase 122 (H) 11 - 51 U/L    Comment: Performed at [facility], [address], [place], [place] [address]  CBC with Differential     Status: None   Collection Time: 10/22/22  1:21 PM  Result Value Ref Range   WBC 9.5 4.0 - 10.5 K/uL   RBC 5.47 4.22 - 5.81 MIL/uL   Hemoglobin 15.4 13.0 - 17.0 g/dL   HCT 55.7 32.2 - 02.5 %   MCV 88.7 80.0 - 100.0 fL   MCH 28.2 26.0 - 34.0 pg   MCHC 31.8 30.0 - 36.0 g/dL   RDW 42.7 06.2 - 37.6 %   Platelets 299 150 - 400 K/uL   nRBC 0.0 0.0 - 0.2 %   Neutrophils Relative % 81 %   Neutro Abs 7.7 1.7 - 7.7 K/uL  Lymphocytes Relative 12 %   Lymphs Abs 1.1 0.7 - 4.0 K/uL   Monocytes Relative 4 %   Monocytes Absolute 0.4 0.1 - 1.0 K/uL   Eosinophils Relative 2 %   Eosinophils Absolute 0.2 0.0 - 0.5 K/uL   Basophils Relative 0 %   Basophils Absolute 0.0 0.0 - 0.1 K/uL   Immature Granulocytes 1 %   Abs Immature Granulocytes 0.05 0.00 - 0.07 K/uL    Comment: Performed at [facility], [address] [place], [place] [address]  Comprehensive metabolic panel     Status: Abnormal   Collection Time: 10/22/22  1:21 PM  Result Value Ref Range   Sodium 137 135 - 145 mmol/L   Potassium 4.4 3.5 - 5.1 mmol/L   Chloride 102 98 - 111 mmol/L   CO2 24 22 - 32 mmol/L   Glucose, Bld 119 (H) 70 - 99 mg/dL    Comment: Glucose reference range applies only to samples taken after fasting for at least 8 hours.   BUN  20 6 - 20 mg/dL   Creatinine, Ser 6.04 (H) 0.61 - 1.24 mg/dL   Calcium 54.0 8.9 - 98.1 mg/dL   Total Protein 8.3 (H) 6.5 - 8.1 g/dL   Albumin 4.2 3.5 - 5.0 g/dL   AST 32 15 - 41 U/L   ALT 36 0 - 44 U/L   Alkaline Phosphatase 99 38 - 126 U/L   Total Bilirubin 1.3 (H) 0.3 - 1.2 mg/dL   GFR, Estimated 29 (L) >60 mL/min    Comment: (NOTE) Calculated using the CKD-EPI Creatinine Equation (2021)    Anion gap 11 5 - 15    Comment: Performed at [facility], [address] [place], [place] [address]  Troponin I (High Sensitivity)     Status: None   Collection Time: 10/22/22  1:21 PM  Result Value Ref Range   Troponin I (High Sensitivity) 14 <18 ng/L    Comment: (NOTE) Elevated high sensitivity troponin I (hsTnI) values and significant  changes across serial measurements may suggest ACS but many other  chronic and acute conditions are known to elevate hsTnI results.  Refer to the "Links" section for chest pain algorithms and additional  guidance. Performed at [facility], [address], [place], [place] [address]   Troponin I (High Sensitivity)     Status: None   Collection Time: 10/22/22  3:48 PM  Result Value Ref Range   Troponin I (High Sensitivity) 13 <18 ng/L    Comment: (NOTE) Elevated high sensitivity troponin I (hsTnI) values and significant  changes across serial measurements may suggest ACS but many other  chronic and acute conditions are known to elevate hsTnI results.  Refer to the "Links" section for chest pain algorithms and additional  guidance. Performed at [facility], [address], [place], [place] [address]    CT ABDOMEN PELVIS WO CONTRAST  Result Date: 10/22/2022 CLINICAL DATA:  Abdominal pain.  Elevated serum lipase EXAM: CT ABDOMEN AND PELVIS WITHOUT CONTRAST TECHNIQUE: Multidetector CT imaging of the abdomen and pelvis was performed following the standard protocol without IV contrast. RADIATION DOSE REDUCTION: This exam was performed  according to the departmental dose-optimization program which includes automated exposure control, adjustment of the mA and/or kV according to patient size and/or use of iterative reconstruction technique. COMPARISON:  01/24/2019 FINDINGS: Lower chest: Coronary artery atherosclerosis.  Gynecomastia. Hepatobiliary: Unremarkable unenhanced appearance of the liver. No focal liver lesion identified. Gallbladder within  normal limits. No hyperdense gallstone. No biliary dilatation. Pancreas: Unremarkable. No pancreatic ductal dilatation or surrounding inflammatory changes. Spleen: Normal in size without focal abnormality. Adrenals/Urinary Tract: Adrenal glands are unremarkable. Kidneys are normal, without renal calculi, focal lesion, or hydronephrosis. Bladder is unremarkable. Stomach/Bowel: Stomach is within normal limits. There are several fluid-filled loops of small bowel throughout the abdomen, some of which are mildly dilated. Transition point to decompressed bowel within the mid right hemiabdomen with fecalization of the small bowel content immediately upstream from the site of transition (series 3, images 44-46). Distal small bowel is decompressed. Normal appendix in the right lower quadrant. Diverticulosis throughout the colon. No focal colonic wall thickening or inflammatory changes. Vascular/Lymphatic: Aortic atherosclerosis. No enlarged abdominal or pelvic lymph nodes. Reproductive: Prostate is unremarkable. Other: Small volume ascites, likely reactive. No organized abdominopelvic fluid collections. No pneumoperitoneum. No abdominal wall hernia. Musculoskeletal: Chronic findings of bilateral sacroiliitis. No new or acute bony findings. IMPRESSION: 1. Low-grade small-bowel obstruction with transition point within the mid right hemiabdomen. 2. Small volume ascites, likely reactive. 3. Colonic diverticulosis without evidence of acute diverticulitis. 4. Chronic findings of bilateral sacroiliitis. 5. Aortic and  coronary artery atherosclerosis (ICD10-I70.0). Electronically Signed   By: Duanne Guess D.O.   On: 10/22/2022 16:32   DG Chest 2 View  Result Date: 10/22/2022 CLINICAL DATA:  Epigastric pain. EXAM: CHEST - 2 VIEW COMPARISON:  None Available. FINDINGS: Bilateral lung fields are clear. Bilateral costophrenic angles are clear. Normal cardio-mediastinal silhouette. No acute osseous abnormalities. No free air under the domes of diaphragm. The soft tissues are within normal limits. IMPRESSION: No active cardiopulmonary disease. Electronically Signed   By: Jules Schick M.D.   On: 10/22/2022 14:57    Pending Labs Unresulted Labs (From admission, onward)     Start     Ordered   10/23/22 0500  Basic metabolic panel  Tomorrow morning,   R        10/22/22 1756   10/23/22 0500  CBC  Tomorrow morning,   R        10/22/22 1756   10/22/22 1809  Urinalysis, Routine w reflex microscopic -Urine, Clean Catch  Once,   R       Question:  Specimen Source  Answer:  Urine, Clean Catch   10/22/22 1808   10/22/22 1756  Magnesium  Once,   R        10/22/22 1756   10/22/22 1755  HIV Antibody (routine testing w rflx)  (HIV Antibody (Routine testing w reflex) panel)  Once,   R        10/22/22 1756            Vitals/Pain Today's Vitals   10/22/22 1545 10/22/22 1626 10/22/22 1630 10/22/22 1645  BP: (!) 136/105  (!) 131/97 (!) 141/105  Pulse: 80  77 72  Resp: 18  16 16   Temp:    98.7 F (37.1 C)  TempSrc:    Oral  SpO2: 93%  94% 97%  Weight:  102.5 kg    Height:  5\' 8"  (1.727 m)    PainSc:        Isolation Precautions No active isolations  Medications Medications  morphine (PF) 2 MG/ML injection 2 mg (has no administration in time range)  0.9 %  sodium chloride infusion (has no administration in time range)  ondansetron (ZOFRAN) tablet 4 mg (has no administration in time range)    Or  ondansetron (ZOFRAN) injection 4 mg (has no administration in  time range)  metoprolol tartrate (LOPRESSOR)  injection 5 mg (has no administration in time range)  aspirin chewable tablet 324 mg (324 mg Oral Given 10/22/22 1326)  ondansetron (ZOFRAN-ODT) disintegrating tablet 4 mg (4 mg Oral Given 10/22/22 1326)  HYDROmorphone (DILAUDID) injection 1 mg (1 mg Intravenous Given 10/22/22 1427)  lidocaine (XYLOCAINE) 2 % jelly 1 Application (1 Application Topical Given 10/22/22 1801)    Mobility walks     Focused Assessments     R Recommendations: See Admitting Provider Note  Report given to:   Additional Notes: Awaiting XR placement verification for NG tube.

## 2022-10-22 NOTE — ED Notes (Signed)
Awaiting patient from lobby 

## 2022-10-23 ENCOUNTER — Inpatient Hospital Stay (HOSPITAL_COMMUNITY): Payer: No Typology Code available for payment source

## 2022-10-23 DIAGNOSIS — E782 Mixed hyperlipidemia: Secondary | ICD-10-CM | POA: Diagnosis present

## 2022-10-23 DIAGNOSIS — Z6834 Body mass index (BMI) 34.0-34.9, adult: Secondary | ICD-10-CM | POA: Diagnosis not present

## 2022-10-23 DIAGNOSIS — I2121 ST elevation (STEMI) myocardial infarction involving left circumflex coronary artery: Secondary | ICD-10-CM | POA: Diagnosis present

## 2022-10-23 DIAGNOSIS — M109 Gout, unspecified: Secondary | ICD-10-CM | POA: Diagnosis present

## 2022-10-23 DIAGNOSIS — Z7902 Long term (current) use of antithrombotics/antiplatelets: Secondary | ICD-10-CM | POA: Diagnosis not present

## 2022-10-23 DIAGNOSIS — K5730 Diverticulosis of large intestine without perforation or abscess without bleeding: Secondary | ICD-10-CM | POA: Diagnosis present

## 2022-10-23 DIAGNOSIS — N1832 Chronic kidney disease, stage 3b: Secondary | ICD-10-CM | POA: Diagnosis present

## 2022-10-23 DIAGNOSIS — Z888 Allergy status to other drugs, medicaments and biological substances status: Secondary | ICD-10-CM | POA: Diagnosis not present

## 2022-10-23 DIAGNOSIS — K56609 Unspecified intestinal obstruction, unspecified as to partial versus complete obstruction: Principal | ICD-10-CM | POA: Diagnosis present

## 2022-10-23 DIAGNOSIS — E6609 Other obesity due to excess calories: Secondary | ICD-10-CM | POA: Diagnosis present

## 2022-10-23 DIAGNOSIS — Z7982 Long term (current) use of aspirin: Secondary | ICD-10-CM | POA: Diagnosis not present

## 2022-10-23 DIAGNOSIS — Z8674 Personal history of sudden cardiac arrest: Secondary | ICD-10-CM | POA: Diagnosis not present

## 2022-10-23 DIAGNOSIS — Z955 Presence of coronary angioplasty implant and graft: Secondary | ICD-10-CM | POA: Diagnosis not present

## 2022-10-23 DIAGNOSIS — Z841 Family history of disorders of kidney and ureter: Secondary | ICD-10-CM | POA: Diagnosis not present

## 2022-10-23 DIAGNOSIS — Z803 Family history of malignant neoplasm of breast: Secondary | ICD-10-CM | POA: Diagnosis not present

## 2022-10-23 DIAGNOSIS — Z823 Family history of stroke: Secondary | ICD-10-CM | POA: Diagnosis not present

## 2022-10-23 DIAGNOSIS — Z8261 Family history of arthritis: Secondary | ICD-10-CM | POA: Diagnosis not present

## 2022-10-23 DIAGNOSIS — I2510 Atherosclerotic heart disease of native coronary artery without angina pectoris: Secondary | ICD-10-CM

## 2022-10-23 DIAGNOSIS — Z79899 Other long term (current) drug therapy: Secondary | ICD-10-CM | POA: Diagnosis not present

## 2022-10-23 DIAGNOSIS — Z833 Family history of diabetes mellitus: Secondary | ICD-10-CM | POA: Diagnosis not present

## 2022-10-23 DIAGNOSIS — I129 Hypertensive chronic kidney disease with stage 1 through stage 4 chronic kidney disease, or unspecified chronic kidney disease: Secondary | ICD-10-CM | POA: Diagnosis present

## 2022-10-23 DIAGNOSIS — R070 Pain in throat: Secondary | ICD-10-CM | POA: Diagnosis present

## 2022-10-23 DIAGNOSIS — Z8616 Personal history of COVID-19: Secondary | ICD-10-CM | POA: Diagnosis not present

## 2022-10-23 DIAGNOSIS — R112 Nausea with vomiting, unspecified: Secondary | ICD-10-CM | POA: Diagnosis present

## 2022-10-23 DIAGNOSIS — R188 Other ascites: Secondary | ICD-10-CM | POA: Diagnosis present

## 2022-10-23 DIAGNOSIS — K56600 Partial intestinal obstruction, unspecified as to cause: Secondary | ICD-10-CM | POA: Diagnosis present

## 2022-10-23 LAB — CBC
HCT: 44.4 % (ref 39.0–52.0)
Hemoglobin: 14 g/dL (ref 13.0–17.0)
MCH: 28.3 pg (ref 26.0–34.0)
MCHC: 31.5 g/dL (ref 30.0–36.0)
MCV: 89.9 fL (ref 80.0–100.0)
Platelets: 222 10*3/uL (ref 150–400)
RBC: 4.94 MIL/uL (ref 4.22–5.81)
RDW: 12.2 % (ref 11.5–15.5)
WBC: 8.7 10*3/uL (ref 4.0–10.5)
nRBC: 0 % (ref 0.0–0.2)

## 2022-10-23 LAB — BASIC METABOLIC PANEL
Anion gap: 7 (ref 5–15)
BUN: 23 mg/dL — ABNORMAL HIGH (ref 6–20)
CO2: 22 mmol/L (ref 22–32)
Calcium: 8.4 mg/dL — ABNORMAL LOW (ref 8.9–10.3)
Chloride: 108 mmol/L (ref 98–111)
Creatinine, Ser: 2.35 mg/dL — ABNORMAL HIGH (ref 0.61–1.24)
GFR, Estimated: 32 mL/min — ABNORMAL LOW (ref >60)
Glucose, Bld: 133 mg/dL — ABNORMAL HIGH (ref 70–99)
Potassium: 4.3 mmol/L (ref 3.5–5.1)
Sodium: 137 mmol/L (ref 135–145)

## 2022-10-23 MED ORDER — SODIUM CHLORIDE 0.9 % IV SOLN: INTRAVENOUS | Status: AC

## 2022-10-23 MED ORDER — SODIUM CHLORIDE 0.9 % IV SOLN
0.7500 ug/kg/min | INTRAVENOUS | Status: AC
  Administered 2022-10-23 – 2022-10-24 (×3): 0.75 ug/kg/min via INTRAVENOUS
  Filled 2022-10-23 (×3): qty 50

## 2022-10-23 MED ORDER — HYDROMORPHONE HCL 1 MG/ML IJ SOLN
1.0000 mg | INTRAMUSCULAR | Status: DC | PRN
  Administered 2022-10-23 – 2022-10-24 (×2): 1 mg via INTRAVENOUS
  Filled 2022-10-23 (×2): qty 1

## 2022-10-23 MED ORDER — DIATRIZOATE MEGLUMINE & SODIUM 66-10 % PO SOLN
90.0000 mL | Freq: Once | ORAL | Status: AC
  Administered 2022-10-23: 90 mL via NASOGASTRIC
  Filled 2022-10-23: qty 90

## 2022-10-23 MED ORDER — ASPIRIN 300 MG RE SUPP
150.0000 mg | Freq: Every day | RECTAL | Status: DC
  Administered 2022-10-23 – 2022-10-24 (×2): 150 mg via RECTAL
  Filled 2022-10-23 (×2): qty 1

## 2022-10-23 NOTE — Progress Notes (Signed)
This nurse reached out to doctors about patient home meds for his heart and BP.

## 2022-10-23 NOTE — Progress Notes (Signed)
PROGRESS NOTE    Chase Walsh  WFU:[id number] DOB: [birth year] DOA: 10/22/2022 PCP: [name], FNP    Brief Narrative:    Chase Walsh is a 52 y.o. male with medical history significant of CAD s/p VF arrest secondary to STEMI in 09/2022 s/p DES to distal Lcx, CKD stage 3, HTN, HLD, gout who presented to ED with complaints of stomach pain and vomiting that started last night.  Found to have SBO.  NG tube in place.  Stay complicated by recent cardiac stent and NPO status.    Assessment and Plan: * Small bowel obstruction (HCC) -no clinical findings for emergent surgery at this time  -general surgery consulted and will follow  -NG tube protocol  -Gentle IV fluid hydration -Pain medication -encourage ambulation -Monitor electrolytes -hx of right inguinal hernia repair as a child with revision as an adult. No other abdominal surgeries -colonoscopy in 2018. Diverticulosis otherwise wnl. Recall 10 years   Elevated lipase Pancreas wnl on imaging Likely secondary to SBO IVF and trend   CAD S/P percutaneous coronary angioplasty Hx of VF arrest secondary to STEMI in 09/2022 s/p DES to distal Lcx On DAPT with ASA and brillinta-last took this AM, but thinks he may have thrown up  -tele Cards consult for possible IV meds if not able to quickly resume home med  CKD stage G3b/A3, GFR 30-44 and albumin creatinine ratio >300 mg/g (HCC) Baseline creatinine appears to be 2.3-2.4 (3.12 at hospital d/c on 10/2)  Labs pending today  Essential hypertension PRN labetalol while NPO   Mixed hyperlipidemia Continue high intensity statin when no longer NPO      DVT prophylaxis: SCDs Start: 10/22/22 1756    Code Status: Full Code   Disposition Plan:  Level of care: Telemetry Medical Status is: Inpatient     Consultants:  GS cards   Subjective: Denies chest pain  Objective: Vitals:   10/22/22 1955 10/22/22 2335 10/23/22 0622 10/23/22 0747  BP: (!) 152/96 (!) 153/100 118/84  117/85  Pulse: 70 74 80 68  Resp:    16  Temp: (!) 97.5 F (36.4 C) 98 F (36.7 C) 97.8 F (36.6 C) 97.7 F (36.5 C)  TempSrc: Oral Oral Oral Oral  SpO2: 100% 100% 99% 99%  Weight:      Height:        Intake/Output Summary (Last 24 hours) at 10/23/2022 1124 Last data filed at 10/23/2022 2025 Gross per 24 hour  Intake --  Output 200 ml  Net -200 ml   Filed Weights   10/22/22 1626  Weight: 102.5 kg    Examination:   General: Appearance:    Obese male in no acute distress     Lungs:     respirations unlabored  Heart:    Normal heart rate.    MS:   All extremities are intact.    Neurologic:   Awake, alert,       Data Reviewed: I have personally reviewed following labs and imaging studies  CBC: Recent Labs  Lab 10/22/22 1321  WBC 9.5  NEUTROABS 7.7  HGB 15.4  HCT 48.5  MCV 88.7  PLT 299   Basic Metabolic Panel: Recent Labs  Lab 10/22/22 1321 10/22/22 2033  NA 137  --   K 4.4  --   CL 102  --   CO2 24  --   GLUCOSE 119*  --   BUN 20  --   CREATININE 2.54*  --   CALCIUM 10.0  --  MG  --  2.3   GFR: Estimated Creatinine Clearance: 39 mL/min (A) (by C-G formula based on SCr of 2.54 mg/dL (H)). Liver Function Tests: Recent Labs  Lab 10/22/22 1321  AST 32  ALT 36  ALKPHOS 99  BILITOT 1.3*  PROT 8.3*  ALBUMIN 4.2   Recent Labs  Lab 10/22/22 1321  LIPASE 122*   No results for input(s): "AMMONIA" in the last 168 hours. Coagulation Profile: No results for input(s): "INR", "PROTIME" in the last 168 hours. Cardiac Enzymes: No results for input(s): "CKTOTAL", "CKMB", "CKMBINDEX", "TROPONINI" in the last 168 hours. BNP (last 3 results) No results for input(s): "PROBNP" in the last 8760 hours. HbA1C: No results for input(s): "HGBA1C" in the last 72 hours. CBG: No results for input(s): "GLUCAP" in the last 168 hours. Lipid Profile: No results for input(s): "CHOL", "HDL", "LDLCALC", "TRIG", "CHOLHDL", "LDLDIRECT" in the last 72  hours. Thyroid Function Tests: No results for input(s): "TSH", "T4TOTAL", "FREET4", "T3FREE", "THYROIDAB" in the last 72 hours. Anemia Panel: No results for input(s): "VITAMINB12", "FOLATE", "FERRITIN", "TIBC", "IRON", "RETICCTPCT" in the last 72 hours. Sepsis Labs: No results for input(s): "PROCALCITON", "LATICACIDVEN" in the last 168 hours.  No results found for this or any previous visit (from the past 240 hour(s)).       Radiology Studies: DG Abd 1 View  Result Date: 10/22/2022 CLINICAL DATA:  Injury tube placement. EXAM: ABDOMEN - 1 VIEW COMPARISON:  CT earlier today FINDINGS: Tip and side port of the enteric tube below the diaphragm in the stomach. There is mild gaseous gastric distension. IMPRESSION: Tip and side port of the enteric tube below the diaphragm in the stomach. Electronically Signed   By: Narda Rutherford M.D.   On: 10/22/2022 18:12   CT ABDOMEN PELVIS WO CONTRAST  Result Date: 10/22/2022 CLINICAL DATA:  Abdominal pain.  Elevated serum lipase EXAM: CT ABDOMEN AND PELVIS WITHOUT CONTRAST TECHNIQUE: Multidetector CT imaging of the abdomen and pelvis was performed following the standard protocol without IV contrast. RADIATION DOSE REDUCTION: This exam was performed according to the departmental dose-optimization program which includes automated exposure control, adjustment of the mA and/or kV according to patient size and/or use of iterative reconstruction technique. COMPARISON:  01/24/2019 FINDINGS: Lower chest: Coronary artery atherosclerosis.  Gynecomastia. Hepatobiliary: Unremarkable unenhanced appearance of the liver. No focal liver lesion identified. Gallbladder within normal limits. No hyperdense gallstone. No biliary dilatation. Pancreas: Unremarkable. No pancreatic ductal dilatation or surrounding inflammatory changes. Spleen: Normal in size without focal abnormality. Adrenals/Urinary Tract: Adrenal glands are unremarkable. Kidneys are normal, without renal calculi,  focal lesion, or hydronephrosis. Bladder is unremarkable. Stomach/Bowel: Stomach is within normal limits. There are several fluid-filled loops of small bowel throughout the abdomen, some of which are mildly dilated. Transition point to decompressed bowel within the mid right hemiabdomen with fecalization of the small bowel content immediately upstream from the site of transition (series 3, images 44-46). Distal small bowel is decompressed. Normal appendix in the right lower quadrant. Diverticulosis throughout the colon. No focal colonic wall thickening or inflammatory changes. Vascular/Lymphatic: Aortic atherosclerosis. No enlarged abdominal or pelvic lymph nodes. Reproductive: Prostate is unremarkable. Other: Small volume ascites, likely reactive. No organized abdominopelvic fluid collections. No pneumoperitoneum. No abdominal wall hernia. Musculoskeletal: Chronic findings of bilateral sacroiliitis. No new or acute bony findings. IMPRESSION: 1. Low-grade small-bowel obstruction with transition point within the mid right hemiabdomen. 2. Small volume ascites, likely reactive. 3. Colonic diverticulosis without evidence of acute diverticulitis. 4. Chronic findings of bilateral sacroiliitis.  5. Aortic and coronary artery atherosclerosis (ICD10-I70.0). Electronically Signed   By: Duanne Guess D.O.   On: 10/22/2022 16:32   DG Chest 2 View  Result Date: 10/22/2022 CLINICAL DATA:  Epigastric pain. EXAM: CHEST - 2 VIEW COMPARISON:  None Available. FINDINGS: Bilateral lung fields are clear. Bilateral costophrenic angles are clear. Normal cardio-mediastinal silhouette. No acute osseous abnormalities. No free air under the domes of diaphragm. The soft tissues are within normal limits. IMPRESSION: No active cardiopulmonary disease. Electronically Signed   By: Jules Schick M.D.   On: 10/22/2022 14:57        Scheduled Meds:  diatrizoate meglumine-sodium  90 mL Per NG tube Once   Continuous Infusions:  sodium  chloride       LOS: 0 days    Time spent: 45 minutes spent on chart review, discussion with nursing staff, consultants, updating family and interview/physical exam; more than 50% of that time was spent in counseling and/or coordination of care.    Joseph Art, DO [facility] Available via Epic secure chat 7am-7pm After these hours, please refer to coverage provider listed on [email or web address] 10/23/2022, 11:24 AM

## 2022-10-23 NOTE — Assessment & Plan Note (Signed)
he is encouraged to strive for BMI less than 30 to decrease cardiac risk. Advised to aim for at least 150 minutes of exercise per week.

## 2022-10-23 NOTE — Plan of Care (Signed)

## 2022-10-23 NOTE — Assessment & Plan Note (Signed)
Blood pressure was elevated initially repeat is better. Continue current medications and he will be following up with Cardiology.

## 2022-10-23 NOTE — Progress Notes (Addendum)
[facility] Progress Note     Subjective: CC:  States he feels better - abdominal cramping as nearly stopped. Denies flatus or BM. Last reported BM was diarrhea around the time his pain started on Saturday. States every since his DES he has been more constipated, takes miralax every other day.   Confirms no history of abdominal surgery, reports inguinal hernia repair in [place].  Objective: Vital signs in last 24 hours: Temp:  [97.5 F (36.4 C)-98.8 F (37.1 C)] 97.7 F (36.5 C) (10/21 0747) Pulse Rate:  [63-80] 68 (10/21 0747) Resp:  [13-18] 16 (10/21 0747) BP: (117-166)/(84-122) 117/85 (10/21 0747) SpO2:  [91 %-100 %] 99 % (10/21 0747) Weight:  [102.5 kg] 102.5 kg (10/20 1626)    Intake/Output from previous day: 10/20 0701 - 10/21 0700 In: -  Out: 200 [Emesis/NG output:200] Intake/Output this shift: No intake/output data recorded.  PE: Gen:  Alert, NAD, pleasant Card:  Regular rate and rhythm Pulm:  Normal effort Abd: Soft, non-tender, mild distention, no guarding, no rebound tenderness    NG in place with minimal bilious output Skin: warm and dry, no rashes  Psych: A&Ox3   Lab Results:  Recent Labs    10/22/22 1321  WBC 9.5  HGB 15.4  HCT 48.5  PLT 299   BMET Recent Labs    10/22/22 1321  NA 137  K 4.4  CL 102  CO2 24  GLUCOSE 119*  BUN 20  CREATININE 2.54*  CALCIUM 10.0   PT/INR No results for input(s): "LABPROT", "INR" in the last 72 hours. CMP     Component Value Date/Time   NA 137 10/22/2022 1321   NA 139 10/11/2022 1042   K 4.4 10/22/2022 1321   CL 102 10/22/2022 1321   CO2 24 10/22/2022 1321   GLUCOSE 119 (H) 10/22/2022 1321   BUN 20 10/22/2022 1321   BUN 27 (H) 10/11/2022 1042   CREATININE 2.54 (H) 10/22/2022 1321   CALCIUM 10.0 10/22/2022 1321   PROT 8.3 (H) 10/22/2022 1321   PROT 7.6 05/12/2021 1648   ALBUMIN 4.2 10/22/2022 1321   ALBUMIN 4.3 05/12/2021 1648   AST 32 10/22/2022 1321   ALT 36 10/22/2022 1321   ALKPHOS  99 10/22/2022 1321   BILITOT 1.3 (H) 10/22/2022 1321   BILITOT 0.6 05/12/2021 1648   GFRNONAA 29 (L) 10/22/2022 1321   GFRAA 48 (L) 02/04/2019 1706   Lipase     Component Value Date/Time   LIPASE 122 (H) 10/22/2022 1321       Studies/Results: DG Abd 1 View  Result Date: 10/22/2022 CLINICAL DATA:  Injury tube placement. EXAM: ABDOMEN - 1 VIEW COMPARISON:  CT earlier today FINDINGS: Tip and side port of the enteric tube below the diaphragm in the stomach. There is mild gaseous gastric distension. IMPRESSION: Tip and side port of the enteric tube below the diaphragm in the stomach. Electronically Signed   By: Narda Rutherford M.D.   On: 10/22/2022 18:12   CT ABDOMEN PELVIS WO CONTRAST  Result Date: 10/22/2022 CLINICAL DATA:  Abdominal pain.  Elevated serum lipase EXAM: CT ABDOMEN AND PELVIS WITHOUT CONTRAST TECHNIQUE: Multidetector CT imaging of the abdomen and pelvis was performed following the standard protocol without IV contrast. RADIATION DOSE REDUCTION: This exam was performed according to the departmental dose-optimization program which includes automated exposure control, adjustment of the mA and/or kV according to patient size and/or use of iterative reconstruction technique. COMPARISON:  01/24/2019 FINDINGS: Lower chest: Coronary artery atherosclerosis.  Gynecomastia.  Hepatobiliary: Unremarkable unenhanced appearance of the liver. No focal liver lesion identified. Gallbladder within normal limits. No hyperdense gallstone. No biliary dilatation. Pancreas: Unremarkable. No pancreatic ductal dilatation or surrounding inflammatory changes. Spleen: Normal in size without focal abnormality. Adrenals/Urinary Tract: Adrenal glands are unremarkable. Kidneys are normal, without renal calculi, focal lesion, or hydronephrosis. Bladder is unremarkable. Stomach/Bowel: Stomach is within normal limits. There are several fluid-filled loops of small bowel throughout the abdomen, some of which are mildly  dilated. Transition point to decompressed bowel within the mid right hemiabdomen with fecalization of the small bowel content immediately upstream from the site of transition (series 3, images 44-46). Distal small bowel is decompressed. Normal appendix in the right lower quadrant. Diverticulosis throughout the colon. No focal colonic wall thickening or inflammatory changes. Vascular/Lymphatic: Aortic atherosclerosis. No enlarged abdominal or pelvic lymph nodes. Reproductive: Prostate is unremarkable. Other: Small volume ascites, likely reactive. No organized abdominopelvic fluid collections. No pneumoperitoneum. No abdominal wall hernia. Musculoskeletal: Chronic findings of bilateral sacroiliitis. No new or acute bony findings. IMPRESSION: 1. Low-grade small-bowel obstruction with transition point within the mid right hemiabdomen. 2. Small volume ascites, likely reactive. 3. Colonic diverticulosis without evidence of acute diverticulitis. 4. Chronic findings of bilateral sacroiliitis. 5. Aortic and coronary artery atherosclerosis (ICD10-I70.0). Electronically Signed   By: Duanne Guess D.O.   On: 10/22/2022 16:32   DG Chest 2 View  Result Date: 10/22/2022 CLINICAL DATA:  Epigastric pain. EXAM: CHEST - 2 VIEW COMPARISON:  None Available. FINDINGS: Bilateral lung fields are clear. Bilateral costophrenic angles are clear. Normal cardio-mediastinal silhouette. No acute osseous abnormalities. No free air under the domes of diaphragm. The soft tissues are within normal limits. IMPRESSION: No active cardiopulmonary disease. Electronically Signed   By: Jules Schick M.D.   On: 10/22/2022 14:57    Anti-infectives: Anti-infectives (From admission, onward)    None        Assessment/Plan 52 y/o M w/ a hx HTN, CKD, and CAD s/p recent VF arrest 2/2 STEMI s/p DES 10/02/22 who presents with abdominal pain, nausea, and emesis x 1 day and has a CT c/f possible pSBO  - afebrile, VSS - NG low output, no bowel  function, plan SBO protocol today  - no emergent role for surgery today - He is high risk for any intervention given his recent cardiac arrest/stent    FEN: NPO, IVF, NG LIWS ID: none indicated VTE: SCD's, ok for lovenox. Ok for ASA. Would like to hold Brilinta until at least tomorrow if possible. Ok for IV antiplatelet therapy if cards recommends  Foley: none Dispo: med-surg   CAD s/p DES 10/02/22 CKD 3 HTN HLD    LOS: 0 days   I reviewed nursing notes, hospitalist notes, last 24 h vitals and pain scores, last 48 h intake and output, last 24 h labs and trends, and last 24 h imaging results.  This care required moderate level of medical decision making.   Hosie Spangle, PA-C [facility] Please see Amion for pager number during day hours 7:00am-4:30pm

## 2022-10-23 NOTE — Assessment & Plan Note (Signed)
Likely related to his recent hospitalization. Explained this will take some time to improve.

## 2022-10-23 NOTE — Plan of Care (Signed)
  Problem: Coping: Goal: Level of anxiety will decrease Outcome: Progressing   Problem: Pain Managment: Goal: General experience of comfort will improve Outcome: Progressing   Problem: Safety: Goal: Ability to remain free from injury will improve Outcome: Progressing   

## 2022-10-23 NOTE — Assessment & Plan Note (Signed)
TCM Performed. A member of the clinical team spoke with the patient upon dischare. Discharge summary was reviewed in full detail during the visit. Meds reconciled and compared to discharge meds. Medication list is updated and reviewed with the patient.  Greater than 50% face to face time was spent in counseling an coordination of care.  All questions were answered to the satisfaction of the patient.   He went into vfib arrest and was found to have an MI. He is feeling better today but still fatigued.

## 2022-10-23 NOTE — Consult Note (Addendum)
Cardiology Consultation   Patient ID: Chase Walsh MRN: [id number]; DOB: [birth year]  Admit date: 10/22/2022 Date of Consult: 10/23/2022  PCP:  [name], FNP   [facility] Providers Cardiologist:  Little Ishikawa, MD   {    Patient Profile:   Chase Walsh is a 52 y.o. male with a hx of CAD s/p STEMI 09/2022 complicated by VF arrest, HTN, HLD, CKD IV,  who is being seen 10/23/2022 for the evaluation of anti-thrombotic therapy  at the request of Dr Benjamine Mola.  History of Present Illness:   Chase Walsh with above PMH presented to the ER yesterday on 10/22/2022 complaining vomiting and stomach pain started the night before.  Abdominal pain was severe 9/10, nonradiating, sitting up provides some relief.  He had a period of constipation followed by diarrhea before he started experiencing abdominal pain and vomiting. He states he has been doing from cardiac standpoint. He had no chest pain since heart cath. He denied any bleeding. He states he chewed 4 tablets of ASA 81mg  at ED yesterday afternoon, last dose Brilinta was taken on 10/22/22 AM. He felt his abdominal pain has improved. He states his current insurance won't cover cardaic rehab.   Admission diagnostic revealed creatinine 2.54 and GFR 29, total bilirubin 1.3.  Lipase elevated 122.High sensitive troponin negative x 2 (14>13).  CBC differential unremarkable. CTAP result revealed low-grade small bowel obstruction with transition point within the mid right hemiabdomen.  Small volume ascites.  Colonic diverticulosis without acute diverticulitis, chronic finding of bilateral sacroiliitis.  EKG revealed sinus rhythm79 bpm, inferior lateral T wave inversion that appears more prominent than EKG from 10/19/2022.  Admitted to hospital medicine for SBO, NG placed for decompression.  He was seen by general surgery, recommend conservative management and no urgent surgery needed. Cardiology is consulted for recommendation on IV  antithrombotic therapy.   Per chart review, patient suffered STEMI 10/01/2022 complicated by V-fib arrest in [place].  Left heart catheterization from 10/02/22 revealed distal left circumflex occlusion, he was treated with DES, also noted with moderate disease of LAD and RCA.  Echocardiogram revealed normal LVEF 55-75%, no significant valvular disease, normal RV.  He recently established care with Dr. Gaynelle Arabian in the office on 10/19/2022, denied any angina symptoms, did report some shortness of breath.  He was tolerating DAPT with aspirin 81 mg daily and Brilinta 90 mg twice daily.  He was advised to continue DAPT for 1 year.  He was continued on atorvastatin 80 mg daily and Toprol XL 25 mg daily and referred to cardiac rehab.  He was noted having CKD stage IV with creatinine 2.66 on 10/11/2022.    Past Medical History:  Diagnosis Date   Allergy    Class 1 obesity due to excess calories with serious comorbidity and body mass index (BMI) of 33.0 to 33.9 in adult 07/08/2018   Diverticulosis    Fall 11/21/2018   Gout    Hematochezia 04/21/2016   History of COVID-19 02/05/2019   Hypertension    Lower GI bleed    Myocardial infarction (HCC) 10/01/22   Vitiligo     Past Surgical History:  Procedure Laterality Date   COLONOSCOPY N/A 04/22/2016   Procedure: COLONOSCOPY;  Surgeon: Sherrilyn Rist, MD;  Location: [facility]COPY;  Service: Endoscopy;  Laterality: N/A;   HERNIA REPAIR       Home Medications:  Prior to Admission medications   Medication Sig Start Date End Date Taking? Authorizing Provider  Ascorbic  Acid (VITAMIN C) 1000 MG tablet Take 1,000 mg by mouth every other day.   Yes [provider]  aspirin 81 MG chewable tablet Chew 1 tablet by mouth daily. 10/04/22  Yes [provider]  atorvastatin (LIPITOR) 80 MG tablet Take 1 tablet by mouth daily. 10/04/22 10/05/23 Yes [provider]  fluocinonide (LIDEX) 0.05 % external solution Apply 1 application   topically as needed (breakout).   Yes [provider]  magnesium oxide (MAG-OX) 400 (240 Mg) MG tablet Take 400 mg by mouth daily.   Yes [provider]  metoprolol succinate (TOPROL-XL) 25 MG 24 hr tablet Take 25 mg by mouth daily. 10/04/22  Yes [provider]  nitroGLYCERIN (NITROSTAT) 0.4 MG SL tablet Place 0.4 mg under the tongue every 5 (five) minutes as needed for chest pain. 10/04/22 11/03/22 Yes [provider]  ticagrelor (BRILINTA) 90 MG TABS tablet Take 90 mg by mouth 2 (two) times daily. 10/04/22  Yes [provider]    Inpatient Medications: Scheduled Meds:  Continuous Infusions:  sodium chloride 75 mL/hr at 10/23/22 1100   PRN Meds: HYDROmorphone (DILAUDID) injection, metoprolol tartrate, ondansetron **OR** ondansetron (ZOFRAN) IV  Allergies:    Allergies  Allergen Reactions   Allopurinol Rash    Social History:   Social History   Socioeconomic History   Marital status: Married    Spouse name: Not on file   Number of children: Not on file   Years of education: Not on file   Highest education level: 12th grade  Occupational History   Not on file  Tobacco Use   Smoking status: Never   Smokeless tobacco: Never  Substance and Sexual Activity   Alcohol use: No   Drug use: No   Sexual activity: Yes    Partners: Female    Comment: married, monogamous  Other Topics Concern   Not on file  Social History Narrative   Not on file   Social Determinants of Health   Financial Resource Strain: Low Risk  (05/01/2022)   Overall Financial Resource Strain (CARDIA)    Difficulty of Paying Living Expenses: Not very hard  Food Insecurity: No Food Insecurity (10/22/2022)   Hunger Vital Sign    Worried About Running Out of Food in the Last Year: Never true    Ran Out of Food in the Last Year: Never true  Transportation Needs: No Transportation Needs (10/22/2022)   PRAPARE - Administrator, Civil Service (Medical): No     Lack of Transportation (Non-Medical): No  Physical Activity: Insufficiently Active (05/01/2022)   Exercise Vital Sign    Days of Exercise per Week: 3 days    Minutes of Exercise per Session: 40 min  Stress: No Stress Concern Present (05/01/2022)   [employer] of Occupational Health - Occupational Stress Questionnaire    Feeling of Stress : Not at all  Social Connections: Socially Integrated (05/01/2022)   Social Connection and Isolation Panel [NHANES]    Frequency of Communication with Friends and Family: More than three times a week    Frequency of Social Gatherings with Friends and Family: Once a week    Attends Religious Services: More than 4 times per year    Active Member of [employer] or Organizations: Yes    Attends Banker Meetings: More than 4 times per year    Marital Status: Married  Catering manager Violence: Not At Risk (10/22/2022)   Humiliation, Afraid, Rape, and Kick questionnaire  Fear of Current or Ex-Partner: No    Emotionally Abused: No    Physically Abused: No    Sexually Abused: No    Family History:    Family History  Problem Relation Age of Onset   Breast cancer Mother    Arthritis Mother    Cancer Mother    Obesity Mother    Cancer Paternal Uncle        maybe colon   Stroke Father    Diabetes Sister    Obesity Sister    Stroke Sister    Diabetes Brother    Kidney disease Brother    Obesity Brother    Kidney disease Brother    Inflammatory bowel disease Neg Hx      ROS:  Constitutional: Denied fever, chills, malaise, night sweats Eyes: Denied vision change or loss Ears/Nose/Mouth/Throat: Denied ear ache, sore throat, coughing, sinus pain Cardiovascular: see HPI  Respiratory: see HPI  Gastrointestinal: see HPI Genital/Urinary: Denied dysuria, hematuria, urinary frequency/urgency Musculoskeletal: Denied muscle ache, joint pain, weakness Skin: Denied rash, wound Neuro: Denied headache, dizziness, syncope Psych: Denied  history of depression/anxiety  Endocrine: history of diabetes     Physical Exam/Data:   Vitals:   10/22/22 2335 10/23/22 0622 10/23/22 0747 10/23/22 1546  BP: (!) 153/100 118/84 117/85 129/83  Pulse: 74 80 68 71  Resp:   16 16  Temp: 98 F (36.7 C) 97.8 F (36.6 C) 97.7 F (36.5 C) 97.7 F (36.5 C)  TempSrc: Oral Oral Oral Oral  SpO2: 100% 99% 99% 100%  Weight:      Height:        Intake/Output Summary (Last 24 hours) at 10/23/2022 1555 Last data filed at 10/23/2022 0626 Gross per 24 hour  Intake --  Output 200 ml  Net -200 ml      10/22/2022    4:26 PM 10/19/2022    9:56 AM 10/11/2022    9:30 AM  Last 3 Weights  Weight (lbs) 225 lb 15.5 oz 226 lb 222 lb 12.8 oz  Weight (kg) 102.5 kg 102.513 kg 101.061 kg     Body mass index is 34.36 kg/m.   Vitals:  Vitals:   10/23/22 0747 10/23/22 1546  BP: 117/85 129/83  Pulse: 68 71  Resp: 16 16  Temp: 97.7 F (36.5 C) 97.7 F (36.5 C)  SpO2: 99% 100%   General Appearance: In no apparent distress, laying in bed HEENT: Normocephalic, atraumatic.  Neck: Supple, trachea midline, no JVDs Cardiovascular: Regular rate and rhythm, normal S1-S2,  no murmur Respiratory: Resting breathing unlabored, lungs sounds clear to auscultation bilaterally, no use of accessory muscles. On room air.  No wheezes, rales or rhonchi.   Gastrointestinal: NG in place, bile color consent in canister  Extremities: Able to move all extremities in bed without difficulty, no edema of BLE  Musculoskeletal: Normal muscle bulk and tone Skin: Intact, warm, dry. No rashes or petechiae noted in exposed areas.  Neurologic: Alert, oriented to person, place and time. no gross focal neuro deficit Psychiatric: Normal affect. Mood is appropriate.   EKG:  The EKG was personally reviewed and demonstrates:    EKG revealed sinus rhythm 79 bpm, inferolateral T wave inversion that appears more prominent than EKG from 10/19/2022.    Relevant CV  Studies:   Echo at [facility] medicine 10/03/22:  Normal LVEF without significant wall motion abnormalities.  No significant valvular abnormalities.  Normal right heart size and function with estimated normal RV systolic  pressure.  No  prior study is available for comparison.   Left Ventricle  The left ventricular end diastolic volume is within normal limits (114.5 mL). The end systolic volume is within normal limits (42 mL). The left ventricular mass is normal (mass = 135.7 g, mass height index = 78.6 g/m).   SYSTOLIC FUNCTION: The left ventricular ejection fraction is normal (55-75%). The calculated LVEF is 63%. LV volumes were measured in the following views: apical 4C and apical 2C. Cardiac output is normal (CO = 4.9 LPM, LVOT CO = 4.2 LPM). There are no focal regional wall motion abnormalities.   DIASTOLIC FUNCTION: There is no evidence of significant diastolic dysfunction. The estimated left atrial pressure does not appear to be significantly elevated.   MASS/THROMBUS: There is no evidence of a LV thrombus or mass.   LV MEASUREMENTS: LVED diameter = 4.86 cm,   Right Ventricle  The size of the right ventricular cavity is within normal limits. RV ejection fraction is normal.   RV MEASUREMENTS: TAPSE = 1.50 cm, Stroke Work Index Estimate = 568.9 mmHgmL/m2, Tricuspid annular S velocity = 13.00 cm/s,   Left Atrium  The size of the left atrial cavity is within normal limits (systolic volume = 37.9 mL).   Right Atrium  The quality of RA imaging was limited diagnostic. The size of the right atrial cavity is within normal limits. The size of the inferior vena cava is indeterminate.   Mitral Valve  The structure of the mitral valve is normal. Mitral leaflet mobility is not significantly reduced.   STENOSIS: There is no significant stenosis of the mitral valve.   REGURGITATION: There is trace mitral regurgitation.   Regurgitant parameters: Regurgitant volume (LV SV) = 9.4 mL. Regurgitant  fraction (LV SV) = 0.13.   Tricuspid Valve  The structure of the tricuspid valve is normal.   STENOSIS: There is no significant tricuspid stenosis.   REGURGITATION: There is trace tricuspid regurgitation.   RIGHT HEART PRESSURE EST: The estimated RV systolic pressure is within normal limits. RV Systolic pressure = 22.5 mmHg, TR Gradient (Peak) = 17.5 mmHg, RA Press Est = 5.0 mmHg, Pulm Vascular Resistance Est Lucretia Roers) = 3.6   Aortic Valve  The structure of the aortic valve is normal. Aortic leaflet mobility is not significantly reduced. The aortic valve is tricuspid.   STENOSIS: There is no significant aortic stenosis.   REGURGITATION: There is no significant aortic regurgitation.   Pulmonic Valve  The quality of PV imaging was limited diagnostic. The structure of the pulmonic valve is grossly normal.   STENOSIS: There is no significant pulmonic stenosis.   REGURGITATION: There is trace pulmonic regurgitation.   Pericardium  There is no pericardial effusion.   Aorta  The size of the sinuses of Valsalva and ascending aorta are normal.   Aortic Diameters: sinus = 3.2 cm, tubular = 3.5 cm.   Cardiac catheterization 10/02/22:  Ectatic Three vessel coronary artery disease.  Successful percutaneous coronary intervention (drug eluting stenting) of  the left circumflex artery.   The patient was monitored continuously 1:1 throughout the entire procedure  by me while sedation was administered.  Procedure Details  Access was obtained in the right radial artery with a 6 fr sheath. The left main was engaged with a 6 fr EBU 3.5 catheter. The RCA was engaged with a 6 fr JR4 catheter.   Bivalirudin was used during the interventional procedure. The Left Main coronary artery was engaged with a 6Fr EBU 3.5 guiding catheter and the LCX  was crossed with a 0.014 inch choice PT wire which was exchanged for a wiggle wire) . The LCX lesion was dilated with a 2.0 balloon to 16 at. A guideliner was  used and the vessel was stented with a 2.25 stent to 18 atm. The radial artery sheath was pulled and hemostasis was achieved with a Terumo TR band. There were no complications during the procedure and the patient was transferred to the Cath Lab Holding Area / [id number] in stable condition.   Coronary Findings Diagnostic Dominance: Co-dominant  Left Anterior Descending: There is moderate disease throughout the vessel.  Left Circumflex: Dist Cx lesion is 100% stenosed.  Right Coronary Artery: There is moderate disease throughout the vessel.   Intervention  Dist Cx lesion: Stent: Drug-eluting stent was successfully placed. The stent used was a STENT COR [id number] PLATCH DES SYN XD. Post-Intervention Lesion Assessment: There is a 0% residual stenosis post intervention.    Laboratory Data:  High Sensitivity Troponin:   Recent Labs  Lab 10/22/22 1321 10/22/22 1548  TROPONINIHS 14 13     Chemistry Recent Labs  Lab 10/22/22 1321 10/22/22 2033  NA 137  --   K 4.4  --   CL 102  --   CO2 24  --   GLUCOSE 119*  --   BUN 20  --   CREATININE 2.54*  --   CALCIUM 10.0  --   MG  --  2.3  GFRNONAA 29*  --   ANIONGAP 11  --     Recent Labs  Lab 10/22/22 1321  PROT 8.3*  ALBUMIN 4.2  AST 32  ALT 36  ALKPHOS 99  BILITOT 1.3*   Lipids No results for input(s): "CHOL", "TRIG", "HDL", "LABVLDL", "LDLCALC", "CHOLHDL" in the last 168 hours.  Hematology Recent Labs  Lab 10/22/22 1321  WBC 9.5  RBC 5.47  HGB 15.4  HCT 48.5  MCV 88.7  MCH 28.2  MCHC 31.8  RDW 12.1  PLT 299   Thyroid No results for input(s): "TSH", "FREET4" in the last 168 hours.  BNPNo results for input(s): "BNP", "PROBNP" in the last 168 hours.  DDimer No results for input(s): "DDIMER" in the last 168 hours.   Radiology/Studies:  DG Abd 1 View  Result Date: 10/22/2022 CLINICAL DATA:  Injury tube placement. EXAM: ABDOMEN - 1 VIEW COMPARISON:  CT earlier today FINDINGS: Tip and side port of the enteric tube  below the diaphragm in the stomach. There is mild gaseous gastric distension. IMPRESSION: Tip and side port of the enteric tube below the diaphragm in the stomach. Electronically Signed   By: Narda Rutherford M.D.   On: 10/22/2022 18:12   CT ABDOMEN PELVIS WO CONTRAST  Result Date: 10/22/2022 CLINICAL DATA:  Abdominal pain.  Elevated serum lipase EXAM: CT ABDOMEN AND PELVIS WITHOUT CONTRAST TECHNIQUE: Multidetector CT imaging of the abdomen and pelvis was performed following the standard protocol without IV contrast. RADIATION DOSE REDUCTION: This exam was performed according to the departmental dose-optimization program which includes automated exposure control, adjustment of the mA and/or kV according to patient size and/or use of iterative reconstruction technique. COMPARISON:  01/24/2019 FINDINGS: Lower chest: Coronary artery atherosclerosis.  Gynecomastia. Hepatobiliary: Unremarkable unenhanced appearance of the liver. No focal liver lesion identified. Gallbladder within normal limits. No hyperdense gallstone. No biliary dilatation. Pancreas: Unremarkable. No pancreatic ductal dilatation or surrounding inflammatory changes. Spleen: Normal in size without focal abnormality. Adrenals/Urinary Tract: Adrenal glands are unremarkable. Kidneys are normal, without renal calculi, focal lesion,  or hydronephrosis. Bladder is unremarkable. Stomach/Bowel: Stomach is within normal limits. There are several fluid-filled loops of small bowel throughout the abdomen, some of which are mildly dilated. Transition point to decompressed bowel within the mid right hemiabdomen with fecalization of the small bowel content immediately upstream from the site of transition (series 3, images 44-46). Distal small bowel is decompressed. Normal appendix in the right lower quadrant. Diverticulosis throughout the colon. No focal colonic wall thickening or inflammatory changes. Vascular/Lymphatic: Aortic atherosclerosis. No enlarged  abdominal or pelvic lymph nodes. Reproductive: Prostate is unremarkable. Other: Small volume ascites, likely reactive. No organized abdominopelvic fluid collections. No pneumoperitoneum. No abdominal wall hernia. Musculoskeletal: Chronic findings of bilateral sacroiliitis. No new or acute bony findings. IMPRESSION: 1. Low-grade small-bowel obstruction with transition point within the mid right hemiabdomen. 2. Small volume ascites, likely reactive. 3. Colonic diverticulosis without evidence of acute diverticulitis. 4. Chronic findings of bilateral sacroiliitis. 5. Aortic and coronary artery atherosclerosis (ICD10-I70.0). Electronically Signed   By: Duanne Guess D.O.   On: 10/22/2022 16:32   DG Chest 2 View  Result Date: 10/22/2022 CLINICAL DATA:  Epigastric pain. EXAM: CHEST - 2 VIEW COMPARISON:  None Available. FINDINGS: Bilateral lung fields are clear. Bilateral costophrenic angles are clear. Normal cardio-mediastinal silhouette. No acute osseous abnormalities. No free air under the domes of diaphragm. The soft tissues are within normal limits. IMPRESSION: No active cardiopulmonary disease. Electronically Signed   By: Jules Schick M.D.   On: 10/22/2022 14:57     Assessment and Plan:   CAD  STEMI s/p DES to LCx on 10/02/22 at [facility] -Hospitalized 10/01/2022 at [facility] for STEMI, course was complicated by V-fib arrest, left heart catheterization 10/02/2022 showed distal left circumflex occlusion, he was treated with DES, also with moderate disease of LAD and RCA. Echo from 10/03/22 showed preserved LVEF, no significant valvular disease - Medical therapy: on DAPT with aspirin 81 mg and Brilinta 90 mg twice daily, Lipitor 80 mg daily, metoprolol 25 mg daily - Now with SBO, no urgent surgery recommended, NG in place for decompression, no choice but to hold ASA and brilinta, recommend transition to ASA rectally and IV cangrelor for bridge therapy until he is able to take PO intake, OK to  hold statin and metoprolol XL, pharmacy on broad and appreciated      Risk Assessment/Risk Scores:       For questions or updates, please contact [facility] Please consult [email or web address] for contact info under    Signed, Cyndi Bender, NP  10/23/2022 3:55 PM   Patient seen and examined.  Agree with above documentation.  Mr. Razzak is a 52 year old male with a history of CAD s/p STEMI 09/2022 complicated by VF arrest, hypertension, hyperlipidemia, CKD stage IV who we are consulted by Dr. Benjamine Mola for antiplatelet management.  He was admitted 10/01/2022 in [place] with VF arrest due to STEMI.  He underwent LHC which showed distal LCx occlusion status post DES, also noted to have moderate disease in RCA and LAD.  Echocardiogram at that time showed normal LVEF, no significant valvular disease.  He has been on aspirin and Brilinta since his cath.  He presented to the ED yesterday with vomiting and abdominal pain.  Initial vital signs notable for BP 166/111, pulse 78, SpO2 100% on room air.  Labs notable for creatinine 2.54 (stable from 2.66 on 10/9), troponin 14 > 13, hemoglobin 15.4, platelets 299, WBC 9.5.  EKG shows normal sinus rhythm, rate 79, Q  waves in leads III, aVF, T wave inversions in inferior leads and V5/6.  CT abdomen pelvis showed low-grade small bowel obstruction with transition point within the mid right hemiabdomen.  General surgery consulted, NG tube placed and started on SBO protocol.  He denies any chest pain.  On exam, patient is alert and oriented, regular rate and rhythm, no murmurs, lungs CTAB, no LE edema or JVD.  Given recent PCI and now with SBO, discussed with pharmacy and will start on rectal aspirin and IV cangrelor.  Should he need to go for surgery, recommendation was to hold cangrelor x 4 hours and restart as soon as safe from bleeding perspective postoperatively.  Little Ishikawa, MD

## 2022-10-23 NOTE — Progress Notes (Signed)
PHARMACY - ANTITHROMBOTIC CONSULT NOTE  Pharmacy Consult for C[facility]or Indication:  Recent coronary stent 10/02/2022  Allergies  Allergen Reactions   Allopurinol Rash    Patient Measurements: Height: 5\' 8"  (172.7 cm) Weight: 102.5 kg (225 lb 15.5 oz) IBW/kg (Calculated) : 68.4  Vital Signs: Temp: 97.7 F (36.5 C) (10/21 1546) Temp Source: Oral (10/21 1546) BP: 129/83 (10/21 1546) Pulse Rate: 71 (10/21 1546)  Labs: Recent Labs    10/22/22 1321 10/22/22 1548 10/23/22 1554  HGB 15.4  --  14.0  HCT 48.5  --  44.4  PLT 299  --  222  CREATININE 2.54*  --   --   TROPONINIHS 14 13  --     Estimated Creatinine Clearance: 39 mL/min (A) (by C-G formula based on SCr of 2.54 mg/dL (H)).   Medical History: Past Medical History:  Diagnosis Date   Allergy    Class 1 obesity due to excess calories with serious comorbidity and body mass index (BMI) of 33.0 to 33.9 in adult 07/08/2018   Diverticulosis    Fall 11/21/2018   Gout    Hematochezia 04/21/2016   History of COVID-19 02/05/2019   Hypertension    Lower GI bleed    Myocardial infarction (HCC) 10/01/22   Vitiligo     Medications:  Medications Prior to Admission  Medication Sig Dispense Refill Last Dose   Ascorbic Acid (VITAMIN C) 1000 MG tablet Take 1,000 mg by mouth every other day.   10/19/2022   aspirin 81 MG chewable tablet Chew 1 tablet by mouth daily.   10/21/2022   atorvastatin (LIPITOR) 80 MG tablet Take 1 tablet by mouth daily.   10/22/2022   fluocinonide (LIDEX) 0.05 % external solution Apply 1 application  topically as needed (breakout).   unk   magnesium oxide (MAG-OX) 400 (240 Mg) MG tablet Take 400 mg by mouth daily.   10/20/2022   metoprolol succinate (TOPROL-XL) 25 MG 24 hr tablet Take 25 mg by mouth daily.   10/22/2022   nitroGLYCERIN (NITROSTAT) 0.4 MG SL tablet Place 0.4 mg under the tongue every 5 (five) minutes as needed for chest pain.   unk   ticagrelor (BRILINTA) 90 MG TABS tablet Take 90 mg by  mouth 2 (two) times daily.   10/22/2022    Assessment: 52 y.o. M presents with SBO. NPO and NGT to suction currently. Noted pt s/p DES to distal Lcx on 10/02/22. On DAPT with ASA and brilinta - last taken this morning but he threw up afterwards. With recent stent and not able to give brilinta, pharmacy consulted to dose cangrelor until brilinta can be restarted. ASA will be given rectally.  Goal of Therapy:  Prevention of stent thrombosis Monitor platelets by anticoagulation protocol: Yes   Plan:  Cangrelor 0.75 mcg/kg/min Daily CBC F/u daily to see if Brilinta can be restarted  Christoper Fabian, PharmD, BCPS Please see amion for complete clinical pharmacist phone list 10/23/2022,4:28 PM

## 2022-10-23 NOTE — Progress Notes (Incomplete)
Madelaine Bhat, CMA,acting as a Neurosurgeon for [name], FNP.,have documented all relevant documentation on the behalf of [name], FNP,as directed by  [name], FNP while in the presence of [name], FNP.  Subjective:  Patient ID: Chase Walsh , male    DOB: 04-Jul-1970 , 52 y.o.   MRN: [id number]  Chief Complaint  Patient presents with  . Hospitalization Follow-up    HPI  Patient presents today for a hospital follow up, Patient reports compliance with medication. Patient denies any chest pain, SOB, or headaches. Patient went to hospital on 10/01/2022 and discharged 10/04/2022. Patient was diagnosed with CAD (coronary artery disease) ST elevation myocardial infarction (STEMI) involving other coronary artery of inferior wall. Today patient is feeling fatigue, he reports he believes it is his medications. Yesterday was the first day he coughed, did not fully expand  BP Readings from Last 3 Encounters: 10/11/22 : (!) 140/100 08/29/22 : 130/80 05/01/22 : 120/86        Past Medical History:  Diagnosis Date  . Allergy   . Class 1 obesity due to excess calories with serious comorbidity and body mass index (BMI) of 33.0 to 33.9 in adult 07/08/2018  . Diverticulosis   . Fall 11/21/2018  . Gout   . Hematochezia 04/21/2016  . History of COVID-19 02/05/2019  . Hypertension   . Lower GI bleed   . Myocardial infarction (HCC) 10/01/22  . Vitiligo      Family History  Problem Relation Age of Onset  . Breast cancer Mother   . Arthritis Mother   . Cancer Mother   . Obesity Mother   . Cancer Paternal Uncle        maybe colon  . Stroke Father   . Diabetes Sister   . Obesity Sister   . Stroke Sister   . Diabetes Brother   . Kidney disease Brother   . Obesity Brother   . Kidney disease Brother   . Inflammatory bowel disease Neg Hx     No current facility-administered medications for this visit. No current outpatient medications on file.  Facility-Administered  Medications Ordered in Other Visits:  .  0.9 %  sodium chloride infusion, , Intravenous, Continuous, Orland Mustard, MD, Last Rate: 75 mL/hr at 10/22/22 1841, New Bag at 10/22/22 1841 .  HYDROmorphone (DILAUDID) injection 1 mg, 1 mg, Intravenous, Once, [place], Flossie Buffy, MD .  metoprolol tartrate (LOPRESSOR) injection 5 mg, 5 mg, Intravenous, Q6H PRN, Orland Mustard, MD .  morphine (PF) 2 MG/ML injection 2 mg, 2 mg, Intravenous, Q3H PRN, Orland Mustard, MD, 2 mg at 10/22/22 2017 .  ondansetron (ZOFRAN) tablet 4 mg, 4 mg, Oral, Q6H PRN **OR** ondansetron (ZOFRAN) injection 4 mg, 4 mg, Intravenous, Q6H PRN, Orland Mustard, MD   Allergies  Allergen Reactions  . Allopurinol Rash     Review of Systems  Constitutional: Negative.   HENT: Negative.    Eyes: Negative.   Respiratory: Negative.    Cardiovascular: Negative.   Gastrointestinal: Negative.   Neurological: Negative.   Psychiatric/Behavioral: Negative.       Today's Vitals   10/11/22 0930 10/11/22 0952  BP: (!) 140/100 120/80  Pulse: 69   Temp: 98.5 F (36.9 C)   TempSrc: Oral   Weight: 222 lb 12.8 oz (101.1 kg)   Height: 5\' 8"  (1.727 m)   PainSc: 0-No pain    Body mass index is 33.88 kg/m.  Wt Readings from Last 3 Encounters:  10/22/22 225 lb 15.5 oz (  102.5 kg)  10/19/22 226 lb (102.5 kg)  10/11/22 222 lb 12.8 oz (101.1 kg)    The ASCVD Risk score (Arnett DK, et al., 2019) failed to calculate for the following reasons:   The patient has a prior MI or stroke diagnosis  Objective:  Physical Exam      Assessment And Plan:  ST elevation myocardial infarction involving left circumflex coronary artery [facility]) Assessment & Plan: TCM Performed. A member of the clinical team spoke with the patient upon dischare. Discharge summary was reviewed in full detail during the visit. Meds reconciled and compared to discharge meds. Medication list is updated and reviewed with the patient.  Greater than 50% face to face time was spent  in counseling an coordination of care.  All questions were answered to the satisfaction of the patient.   He went into vfib arrest and was found to have an MI. He is feeling better today but still fatigued.   Orders: -     Ambulatory referral to Home Health -     Amb ref to Medical Nutrition Therapy-MNT  Fatigue, unspecified type Assessment & Plan: Likely related to his recent hospitalization. Explained this will take some time to improve.   Benign hypertension with CKD (chronic kidney disease) stage III (HCC) Assessment & Plan: Blood pressure was elevated initially repeat is better. Continue current medications and he will be following up with Cardiology.   Orders: -     BMP8+eGFR  Class 1 obesity due to excess calories with body mass index (BMI) of 33.0 to 33.9 in adult, unspecified whether serious comorbidity present Assessment & Plan: he is encouraged to strive for BMI less than 30 to decrease cardiac risk. Advised to aim for at least 150 minutes of exercise per week.    Hx of ventricular fibrillation -     BMP8+eGFR    No follow-ups on file.  Patient was given opportunity to ask questions. Patient verbalized understanding of the plan and was able to repeat key elements of the plan. All questions were answered to their satisfaction.    Jeanell Sparrow, FNP, have reviewed all documentation for this visit. The documentation on 10/23/22 for the exam, diagnosis, procedures, and orders are all accurate and complete.   IF YOU HAVE BEEN REFERRED TO A SPECIALIST, IT MAY TAKE 1-2 WEEKS TO SCHEDULE/PROCESS THE REFERRAL. IF YOU HAVE NOT HEARD FROM US/SPECIALIST IN TWO WEEKS, PLEASE GIVE [place] A CALL AT [phone].

## 2022-10-24 DIAGNOSIS — K56609 Unspecified intestinal obstruction, unspecified as to partial versus complete obstruction: Secondary | ICD-10-CM | POA: Diagnosis not present

## 2022-10-24 LAB — CBC
HCT: 44.1 % (ref 39.0–52.0)
Hemoglobin: 13.7 g/dL (ref 13.0–17.0)
MCH: 28.2 pg (ref 26.0–34.0)
MCHC: 31.1 g/dL (ref 30.0–36.0)
MCV: 90.7 fL (ref 80.0–100.0)
Platelets: 216 10*3/uL (ref 150–400)
RBC: 4.86 MIL/uL (ref 4.22–5.81)
RDW: 12.3 % (ref 11.5–15.5)
WBC: 8.6 10*3/uL (ref 4.0–10.5)
nRBC: 0 % (ref 0.0–0.2)

## 2022-10-24 LAB — BASIC METABOLIC PANEL
Anion gap: 9 (ref 5–15)
BUN: 20 mg/dL (ref 6–20)
CO2: 23 mmol/L (ref 22–32)
Calcium: 9.1 mg/dL (ref 8.9–10.3)
Chloride: 110 mmol/L (ref 98–111)
Creatinine, Ser: 2.31 mg/dL — ABNORMAL HIGH (ref 0.61–1.24)
GFR, Estimated: 33 mL/min — ABNORMAL LOW (ref >60)
Glucose, Bld: 101 mg/dL — ABNORMAL HIGH (ref 70–99)
Potassium: 4.2 mmol/L (ref 3.5–5.1)
Sodium: 142 mmol/L (ref 135–145)

## 2022-10-24 MED ORDER — TICAGRELOR 90 MG PO TABS
90.0000 mg | ORAL_TABLET | Freq: Two times a day (BID) | ORAL | Status: DC
  Administered 2022-10-25: 90 mg via ORAL
  Filled 2022-10-24 (×2): qty 1

## 2022-10-24 MED ORDER — TICAGRELOR 90 MG PO TABS
180.0000 mg | ORAL_TABLET | Freq: Once | ORAL | Status: AC
  Administered 2022-10-24: 180 mg via ORAL
  Filled 2022-10-24 (×2): qty 2

## 2022-10-24 MED ORDER — ASPIRIN 81 MG PO TBEC
81.0000 mg | DELAYED_RELEASE_TABLET | Freq: Every day | ORAL | Status: DC
  Administered 2022-10-25: 81 mg via ORAL
  Filled 2022-10-24: qty 1

## 2022-10-24 MED ORDER — SODIUM CHLORIDE 0.9 % IV SOLN: INTRAVENOUS | Status: DC

## 2022-10-24 NOTE — TOC Initial Note (Signed)
Transition of Care (TOC) - Initial/Assessment Note   Spoke to patient at bedside. From home with wife. Did not use any DME prior to admission.   Patient's PCP is [name]   Patient does have Pharmacologist . Patient uses [facility] at [facility] for pharmacy, uses yellow discount card for prescriptions.   He has tried to apply for medicaid on line , however having difficulty. Consulted financial counselor to see if they can help .  Patient Details  Name: Chase Walsh MRN: [id number] Date of Birth: January 26, 1970  Transition of Care [facility]) CM/SW Contact:    Kingsley Plan, RN Phone Number: 10/24/2022, 3:40 PM  Clinical Narrative:                   Expected Discharge Plan: Home/Self Care Barriers to Discharge: Continued Medical Work up   Patient Goals and CMS Choice Patient states their goals for this hospitalization and ongoing recovery are:: to return to home          Expected Discharge Plan and Services In-house Referral: Financial Counselor Discharge Planning Services: CM Consult Post Acute Care Choice: NA Living arrangements for the past 2 months: Single Family Home                 DME Arranged: N/A DME Agency: NA       HH Arranged: NA          Prior Living Arrangements/Services Living arrangements for the past 2 months: Single Family Home Lives with:: Spouse Patient language and need for interpreter reviewed:: Yes Do you feel safe going back to the place where you live?: Yes      Need for Family Participation in Patient Care: Yes (Comment) Care giver support system in place?: Yes (comment)   Criminal Activity/Legal Involvement Pertinent to Current Situation/Hospitalization: No - Comment as needed  Activities of Daily Living   ADL Screening (condition at time of admission) Independently performs ADLs?: Yes (appropriate for developmental age) Is the patient deaf or have difficulty hearing?: No Does the patient have difficulty seeing,  even when wearing glasses/contacts?: No Does the patient have difficulty concentrating, remembering, or making decisions?: No  Permission Sought/Granted   Permission granted to share information with : Yes, Verbal Permission Granted     Permission granted to share info w AGENCY: financial counselor        Emotional Assessment Appearance:: Appears stated age Attitude/Demeanor/Rapport: Engaged Affect (typically observed): Accepting Orientation: : Oriented to Self, Oriented to Place, Oriented to  Time, Oriented to Situation Alcohol / Substance Use: Not Applicable Psych Involvement: No (comment)  Admission diagnosis:  Small bowel obstruction (HCC) [K56.609] SBO (small bowel obstruction) (HCC) [K56.609] Patient Active Problem List   Diagnosis Date Noted   SBO (small bowel obstruction) (HCC) 10/23/2022   Small bowel obstruction (HCC) 10/22/2022   CAD S/P percutaneous coronary angioplasty 10/22/2022   Elevated lipase 10/22/2022   Hx of ventricular fibrillation 10/11/2022   Class 1 obesity due to excess calories with body mass index (BMI) of 33.0 to 33.9 in adult 10/11/2022   Fatigue 10/11/2022   ST elevation (STEMI) myocardial infarction (HCC) 10/01/2022   Herpes zoster vaccination declined 09/10/2022   Influenza vaccination declined 09/10/2022   Benign hypertension with CKD (chronic kidney disease) stage III (HCC) 09/10/2022   CKD stage G3b/A3, GFR 30-44 and albumin creatinine ratio >300 mg/g (HCC) 08/29/2022   Mixed hyperlipidemia 05/14/2022   Class 2 severe obesity due to excess calories with serious comorbidity and body  mass index (BMI) of 35.0 to 35.9 in adult [facility]) 05/14/2022   Essential hypertension 07/03/2021   Gout involving toe of right foot 07/17/2020   Chronic gout of left knee 03/18/2018   Diverticulosis of colon with hemorrhage    Vitiligo    PCP:  [name], FNP Pharmacy:   [id number] - [address] ([place]), [place] - [address]  ([place]) [place] [address] Phone: [phone] Fax: [phone]  [facility] [address] [place], [place] - [address] [place] [facility] [facility] [address] [place] [address] Phone: [phone] Fax: [phone]     Social Determinants of Health (SDOH) Social History: SDOH Screenings   Food Insecurity: No Food Insecurity (10/22/2022)  Housing: Low Risk  (10/22/2022)  Transportation Needs: No Transportation Needs (10/22/2022)  Utilities: Not At Risk (10/22/2022)  Depression (PHQ2-9): Low Risk  (05/01/2022)  Financial Resource Strain: Low Risk  (05/01/2022)  Physical Activity: Insufficiently Active (05/01/2022)  Social Connections: Socially Integrated (05/01/2022)  Stress: No Stress Concern Present (05/01/2022)  Tobacco Use: Low Risk  (10/22/2022)   SDOH Interventions:     Readmission Risk Interventions     No data to display

## 2022-10-24 NOTE — Progress Notes (Signed)
[facility] Progress Note     Subjective: CC:  Denies abd pain. Cc throat pain. Reports flatus and multiple BMs overnight.  Objective: Vital signs in last 24 hours: Temp:  [97.7 F (36.5 C)-97.8 F (36.6 C)] 97.8 F (36.6 C) (10/22 0640) Pulse Rate:  [67-80] 67 (10/22 0640) Resp:  [16-22] 20 (10/22 0640) BP: (127-137)/(83-98) 137/86 (10/22 0640) SpO2:  [99 %-100 %] 99 % (10/22 0640)    Intake/Output from previous day: 10/21 0701 - 10/22 0700 In: -  Out: 175 [Emesis/NG output:175] Intake/Output this shift: No intake/output data recorded.  PE: Gen:  Alert, NAD, pleasant Card:  Regular rate and rhythm Pulm:  Normal effort Abd: Soft, non-tender, mild distention, no guarding, no rebound tenderness    NGT removed by me Skin: warm and dry, no rashes  Psych: A&Ox3   Lab Results:  Recent Labs    10/22/22 1321 10/23/22 1554  WBC 9.5 8.7  HGB 15.4 14.0  HCT 48.5 44.4  PLT 299 222   BMET Recent Labs    10/22/22 1321 10/23/22 1554  NA 137 137  K 4.4 4.3  CL 102 108  CO2 24 22  GLUCOSE 119* 133*  BUN 20 23*  CREATININE 2.54* 2.35*  CALCIUM 10.0 8.4*   PT/INR No results for input(s): "LABPROT", "INR" in the last 72 hours. CMP     Component Value Date/Time   NA 137 10/23/2022 1554   NA 139 10/11/2022 1042   K 4.3 10/23/2022 1554   CL 108 10/23/2022 1554   CO2 22 10/23/2022 1554   GLUCOSE 133 (H) 10/23/2022 1554   BUN 23 (H) 10/23/2022 1554   BUN 27 (H) 10/11/2022 1042   CREATININE 2.35 (H) 10/23/2022 1554   CALCIUM 8.4 (L) 10/23/2022 1554   PROT 8.3 (H) 10/22/2022 1321   PROT 7.6 05/12/2021 1648   ALBUMIN 4.2 10/22/2022 1321   ALBUMIN 4.3 05/12/2021 1648   AST 32 10/22/2022 1321   ALT 36 10/22/2022 1321   ALKPHOS 99 10/22/2022 1321   BILITOT 1.3 (H) 10/22/2022 1321   BILITOT 0.6 05/12/2021 1648   GFRNONAA 32 (L) 10/23/2022 1554   GFRAA 48 (L) 02/04/2019 1706   Lipase     Component Value Date/Time   LIPASE 122 (H) 10/22/2022 1321        Studies/Results: DG Abd Portable 1V-Small Bowel Obstruction Protocol-initial, 8 hr delay  Result Date: 10/23/2022 CLINICAL DATA:  Small bowel obstruction, 8 hour delay. EXAM: PORTABLE ABDOMEN - 1 VIEW COMPARISON:  Radiograph and CT yesterday FINDINGS: There is enteric contrast throughout the colon to the level of the rectum. Mild small bowel distension centrally persists. Tip and side port of the enteric tube below the diaphragm in the stomach. IMPRESSION: 1. Enteric contrast throughout the colon to the level of the rectum, likely resolving obstruction. 2. Mild small bowel distension centrally persists. Electronically Signed   By: Narda Rutherford M.D.   On: 10/23/2022 23:58   DG Abd 1 View  Result Date: 10/22/2022 CLINICAL DATA:  Injury tube placement. EXAM: ABDOMEN - 1 VIEW COMPARISON:  CT earlier today FINDINGS: Tip and side port of the enteric tube below the diaphragm in the stomach. There is mild gaseous gastric distension. IMPRESSION: Tip and side port of the enteric tube below the diaphragm in the stomach. Electronically Signed   By: Narda Rutherford M.D.   On: 10/22/2022 18:12   CT ABDOMEN PELVIS WO CONTRAST  Result Date: 10/22/2022 CLINICAL DATA:  Abdominal pain.  Elevated serum  lipase EXAM: CT ABDOMEN AND PELVIS WITHOUT CONTRAST TECHNIQUE: Multidetector CT imaging of the abdomen and pelvis was performed following the standard protocol without IV contrast. RADIATION DOSE REDUCTION: This exam was performed according to the departmental dose-optimization program which includes automated exposure control, adjustment of the mA and/or kV according to patient size and/or use of iterative reconstruction technique. COMPARISON:  01/24/2019 FINDINGS: Lower chest: Coronary artery atherosclerosis.  Gynecomastia. Hepatobiliary: Unremarkable unenhanced appearance of the liver. No focal liver lesion identified. Gallbladder within normal limits. No hyperdense gallstone. No biliary dilatation.  Pancreas: Unremarkable. No pancreatic ductal dilatation or surrounding inflammatory changes. Spleen: Normal in size without focal abnormality. Adrenals/Urinary Tract: Adrenal glands are unremarkable. Kidneys are normal, without renal calculi, focal lesion, or hydronephrosis. Bladder is unremarkable. Stomach/Bowel: Stomach is within normal limits. There are several fluid-filled loops of small bowel throughout the abdomen, some of which are mildly dilated. Transition point to decompressed bowel within the mid right hemiabdomen with fecalization of the small bowel content immediately upstream from the site of transition (series 3, images 44-46). Distal small bowel is decompressed. Normal appendix in the right lower quadrant. Diverticulosis throughout the colon. No focal colonic wall thickening or inflammatory changes. Vascular/Lymphatic: Aortic atherosclerosis. No enlarged abdominal or pelvic lymph nodes. Reproductive: Prostate is unremarkable. Other: Small volume ascites, likely reactive. No organized abdominopelvic fluid collections. No pneumoperitoneum. No abdominal wall hernia. Musculoskeletal: Chronic findings of bilateral sacroiliitis. No new or acute bony findings. IMPRESSION: 1. Low-grade small-bowel obstruction with transition point within the mid right hemiabdomen. 2. Small volume ascites, likely reactive. 3. Colonic diverticulosis without evidence of acute diverticulitis. 4. Chronic findings of bilateral sacroiliitis. 5. Aortic and coronary artery atherosclerosis (ICD10-I70.0). Electronically Signed   By: Duanne Guess D.O.   On: 10/22/2022 16:32   DG Chest 2 View  Result Date: 10/22/2022 CLINICAL DATA:  Epigastric pain. EXAM: CHEST - 2 VIEW COMPARISON:  None Available. FINDINGS: Bilateral lung fields are clear. Bilateral costophrenic angles are clear. Normal cardio-mediastinal silhouette. No acute osseous abnormalities. No free air under the domes of diaphragm. The soft tissues are within normal  limits. IMPRESSION: No active cardiopulmonary disease. Electronically Signed   By: Jules Schick M.D.   On: 10/22/2022 14:57    Anti-infectives: Anti-infectives (From admission, onward)    None        Assessment/Plan 52 y/o M w/ a hx HTN, CKD, and CAD s/p recent VF arrest 2/2 STEMI s/p DES 10/02/22 who presents with abdominal pain, nausea, and emesis x 1 day and has a CT c/f possible pSBO  - afebrile, VSS - SBO protocol >> contrast in the colon. He is now having bowel function and is not clinically obstructed - FLD, ADAT SOFT - no acute surgical needs. If tolerates a diet he can resume his DAPT. - He is high risk for any intervention given his recent cardiac arrest/stent    FEN: FLD, ADAT SOFT ID: none indicated VTE: SCD's, rectal ASA, IV cangrelor  Foley: none Dispo: med-surg   CAD s/p DES 10/02/22 CKD 3 HTN HLD    LOS: 1 day   I reviewed nursing notes, hospitalist notes, last 24 h vitals and pain scores, last 48 h intake and output, last 24 h labs and trends, and last 24 h imaging results.  This care required moderate level of medical decision making.   Hosie Spangle, PA-C [facility] Please see Amion for pager number during day hours 7:00am-4:30pm

## 2022-10-24 NOTE — Plan of Care (Signed)
  Problem: Education: Goal: Knowledge of General Education information will improve Description Including pain rating scale, medication(s)/side effects and non-pharmacologic comfort measures Outcome: Progressing   Problem: Health Behavior/Discharge Planning: Goal: Ability to manage health-related needs will improve Outcome: Progressing   

## 2022-10-24 NOTE — Progress Notes (Signed)
PROGRESS NOTE    Chase Walsh  ZOX:[id number] DOB: 04-28-1970 DOA: 10/22/2022 PCP: [name], FNP    Brief Narrative:    Chase Walsh is a 52 y.o. male with medical history significant of CAD s/p VF arrest secondary to STEMI in 09/2022 s/p DES to distal Lcx, CKD stage 3, HTN, HLD, gout who presented to ED with complaints of stomach pain and vomiting that started last night.  Found to have SBO.  NG tube in place.  Stay complicated by recent cardiac stent and NPO status.     Assessment and Plan: Small bowel obstruction (HCC) -no clinical findings for emergent surgery at this time  -general surgery consulted and will follow  -NG tube removed on 10/22 -Pain medication -encourage ambulation  Elevated lipase Pancreas wnl on imaging Likely secondary to SBO IVF and trend   CAD S/P percutaneous coronary angioplasty Hx of VF arrest secondary to STEMI in 09/2022 s/p DES to distal Lcx On DAPT with ASA and brillinta-last took this AM, but thinks he may have thrown up  -tele -continue IV cangrelor until taking PO consistent-- resume brilenta then  CKD stage G3b/A3, GFR 30-44 and albumin creatinine ratio >300 mg/g (HCC) Baseline creatinine: 2.3-2.4 (3.12 at hospital d/c on 10/2)  Appears to be at baseline  Essential hypertension PRN labetalol while NPO - resume home meds when able  Mixed hyperlipidemia Continue high intensity statin when no longer NPO      DVT prophylaxis: SCDs Start: 10/22/22 1756    Code Status: Full Code   Disposition Plan:  Level of care: Telemetry Medical Status is: Inpatient     Consultants:  GS cards   Subjective: Multiple stools  Objective: Vitals:   10/23/22 1546 10/23/22 2025 10/24/22 0640 10/24/22 1057  BP: 129/83 (!) 127/98 137/86 134/85  Pulse: 71 80 67 72  Resp: 16 (!) 22 20 17   Temp: 97.7 F (36.5 C) 97.8 F (36.6 C) 97.8 F (36.6 C) (!) 97.4 F (36.3 C)  TempSrc: Oral Oral Oral   SpO2: 100% 100% 99% 100%  Weight:       Height:        Intake/Output Summary (Last 24 hours) at 10/24/2022 1230 Last data filed at 10/24/2022 8119 Gross per 24 hour  Intake --  Output 175 ml  Net -175 ml   Filed Weights   10/22/22 1626  Weight: 102.5 kg    Examination:   General: Appearance:    Obese male in no acute distress     Lungs:     respirations unlabored  Heart:    Normal heart rate.    MS:   All extremities are intact.    Neurologic:   Awake, alert,       Data Reviewed: I have personally reviewed following labs and imaging studies  CBC: Recent Labs  Lab 10/22/22 1321 10/23/22 1554 10/24/22 0837  WBC 9.5 8.7 8.6  NEUTROABS 7.7  --   --   HGB 15.4 14.0 13.7  HCT 48.5 44.4 44.1  MCV 88.7 89.9 90.7  PLT 299 222 216   Basic Metabolic Panel: Recent Labs  Lab 10/22/22 1321 10/22/22 2033 10/23/22 1554 10/24/22 0837  NA 137  --  137 142  K 4.4  --  4.3 4.2  CL 102  --  108 110  CO2 24  --  22 23  GLUCOSE 119*  --  133* 101*  BUN 20  --  23* 20  CREATININE 2.54*  --  2.35* 2.31*  CALCIUM 10.0  --  8.4* 9.1  MG  --  2.3  --   --    GFR: Estimated Creatinine Clearance: 42.9 mL/min (A) (by C-G formula based on SCr of 2.31 mg/dL (H)). Liver Function Tests: Recent Labs  Lab 10/22/22 1321  AST 32  ALT 36  ALKPHOS 99  BILITOT 1.3*  PROT 8.3*  ALBUMIN 4.2   Recent Labs  Lab 10/22/22 1321  LIPASE 122*   No results for input(s): "AMMONIA" in the last 168 hours. Coagulation Profile: No results for input(s): "INR", "PROTIME" in the last 168 hours. Cardiac Enzymes: No results for input(s): "CKTOTAL", "CKMB", "CKMBINDEX", "TROPONINI" in the last 168 hours. BNP (last 3 results) No results for input(s): "PROBNP" in the last 8760 hours. HbA1C: No results for input(s): "HGBA1C" in the last 72 hours. CBG: No results for input(s): "GLUCAP" in the last 168 hours. Lipid Profile: No results for input(s): "CHOL", "HDL", "LDLCALC", "TRIG", "CHOLHDL", "LDLDIRECT" in the last 72  hours. Thyroid Function Tests: No results for input(s): "TSH", "T4TOTAL", "FREET4", "T3FREE", "THYROIDAB" in the last 72 hours. Anemia Panel: No results for input(s): "VITAMINB12", "FOLATE", "FERRITIN", "TIBC", "IRON", "RETICCTPCT" in the last 72 hours. Sepsis Labs: No results for input(s): "PROCALCITON", "LATICACIDVEN" in the last 168 hours.  No results found for this or any previous visit (from the past 240 hour(s)).       Radiology Studies: DG Abd Portable 1V-Small Bowel Obstruction Protocol-initial, 8 hr delay  Result Date: 10/23/2022 CLINICAL DATA:  Small bowel obstruction, 8 hour delay. EXAM: PORTABLE ABDOMEN - 1 VIEW COMPARISON:  Radiograph and CT yesterday FINDINGS: There is enteric contrast throughout the colon to the level of the rectum. Mild small bowel distension centrally persists. Tip and side port of the enteric tube below the diaphragm in the stomach. IMPRESSION: 1. Enteric contrast throughout the colon to the level of the rectum, likely resolving obstruction. 2. Mild small bowel distension centrally persists. Electronically Signed   By: Narda Rutherford M.D.   On: 10/23/2022 23:58   DG Abd 1 View  Result Date: 10/22/2022 CLINICAL DATA:  Injury tube placement. EXAM: ABDOMEN - 1 VIEW COMPARISON:  CT earlier today FINDINGS: Tip and side port of the enteric tube below the diaphragm in the stomach. There is mild gaseous gastric distension. IMPRESSION: Tip and side port of the enteric tube below the diaphragm in the stomach. Electronically Signed   By: Narda Rutherford M.D.   On: 10/22/2022 18:12   CT ABDOMEN PELVIS WO CONTRAST  Result Date: 10/22/2022 CLINICAL DATA:  Abdominal pain.  Elevated serum lipase EXAM: CT ABDOMEN AND PELVIS WITHOUT CONTRAST TECHNIQUE: Multidetector CT imaging of the abdomen and pelvis was performed following the standard protocol without IV contrast. RADIATION DOSE REDUCTION: This exam was performed according to the departmental dose-optimization  program which includes automated exposure control, adjustment of the mA and/or kV according to patient size and/or use of iterative reconstruction technique. COMPARISON:  01/24/2019 FINDINGS: Lower chest: Coronary artery atherosclerosis.  Gynecomastia. Hepatobiliary: Unremarkable unenhanced appearance of the liver. No focal liver lesion identified. Gallbladder within normal limits. No hyperdense gallstone. No biliary dilatation. Pancreas: Unremarkable. No pancreatic ductal dilatation or surrounding inflammatory changes. Spleen: Normal in size without focal abnormality. Adrenals/Urinary Tract: Adrenal glands are unremarkable. Kidneys are normal, without renal calculi, focal lesion, or hydronephrosis. Bladder is unremarkable. Stomach/Bowel: Stomach is within normal limits. There are several fluid-filled loops of small bowel throughout the abdomen, some of which are mildly dilated. Transition point to decompressed bowel within  the mid right hemiabdomen with fecalization of the small bowel content immediately upstream from the site of transition (series 3, images 44-46). Distal small bowel is decompressed. Normal appendix in the right lower quadrant. Diverticulosis throughout the colon. No focal colonic wall thickening or inflammatory changes. Vascular/Lymphatic: Aortic atherosclerosis. No enlarged abdominal or pelvic lymph nodes. Reproductive: Prostate is unremarkable. Other: Small volume ascites, likely reactive. No organized abdominopelvic fluid collections. No pneumoperitoneum. No abdominal wall hernia. Musculoskeletal: Chronic findings of bilateral sacroiliitis. No new or acute bony findings. IMPRESSION: 1. Low-grade small-bowel obstruction with transition point within the mid right hemiabdomen. 2. Small volume ascites, likely reactive. 3. Colonic diverticulosis without evidence of acute diverticulitis. 4. Chronic findings of bilateral sacroiliitis. 5. Aortic and coronary artery atherosclerosis (ICD10-I70.0).  Electronically Signed   By: Duanne Guess D.O.   On: 10/22/2022 16:32   DG Chest 2 View  Result Date: 10/22/2022 CLINICAL DATA:  Epigastric pain. EXAM: CHEST - 2 VIEW COMPARISON:  None Available. FINDINGS: Bilateral lung fields are clear. Bilateral costophrenic angles are clear. Normal cardio-mediastinal silhouette. No acute osseous abnormalities. No free air under the domes of diaphragm. The soft tissues are within normal limits. IMPRESSION: No active cardiopulmonary disease. Electronically Signed   By: Jules Schick M.D.   On: 10/22/2022 14:57        Scheduled Meds:  aspirin  150 mg Rectal Daily   Continuous Infusions:  sodium chloride 100 mL/hr at 10/24/22 0956   cangrelor [facility]) 50 mg in sodium chloride 0.9 % 250 mL (0.2 mg/mL) infusion 0.75 mcg/kg/min (10/24/22 0600)     LOS: 1 day    Time spent: 45 minutes spent on chart review, discussion with nursing staff, consultants, updating family and interview/physical exam; more than 50% of that time was spent in counseling and/or coordination of care.    Joseph Art, DO [facility] Available via Epic secure chat 7am-7pm After these hours, please refer to coverage provider listed on [email or web address] 10/24/2022, 12:30 PM

## 2022-10-24 NOTE — Progress Notes (Addendum)
   Patient Name: Chase Walsh Date of Encounter: 10/24/2022 [facility] Cardiologist: Little Ishikawa, MD   Interval Summary  .    Patient feeling well this AM. NG tube stopped, currently trying to drink liquids. Not yet back on pills/oral meds   Vital Signs .    Vitals:   10/23/22 0747 10/23/22 1546 10/23/22 2025 10/24/22 0640  BP: 117/85 129/83 (!) 127/98 137/86  Pulse: 68 71 80 67  Resp: 16 16 (!) 22 20  Temp: 97.7 F (36.5 C) 97.7 F (36.5 C) 97.8 F (36.6 C) 97.8 F (36.6 C)  TempSrc: Oral Oral Oral Oral  SpO2: 99% 100% 100% 99%  Weight:      Height:        Intake/Output Summary (Last 24 hours) at 10/24/2022 0940 Last data filed at 10/24/2022 0646 Gross per 24 hour  Intake --  Output 175 ml  Net -175 ml      10/22/2022    4:26 PM 10/19/2022    9:56 AM 10/11/2022    9:30 AM  Last 3 Weights  Weight (lbs) 225 lb 15.5 oz 226 lb 222 lb 12.8 oz  Weight (kg) 102.5 kg 102.513 kg 101.061 kg      Telemetry/ECG    NSR  - Personally Reviewed  Physical Exam .   GEN: No acute distress.  Sitting upright in the bed Neck: No JVD Cardiac: RRR, no murmurs, rubs, or gallops. Radial pulses 2+ bilaterally  Respiratory: Breathing unlabored  GI: Soft, nontender, non-distended  MS: No edema in BLE   Assessment & Plan .     CAD  Recent STEMI s/p DES to Lcx  - Patient was hospitalized 10/01/22 in [place] for STEMI. Course complicated by V-fib arrest. Underwent Cath on 9/30 that showed distal Lcx occlusion that was treated with DES. There was also moderate disease of the LAD and RCA that was managed medially  - Echocardiogram from 10/03/22 showed preserved EF, no significant valvular disease  - Discharged on ASA 81 mg daily, Brilinta 90 mg BID, lipitor, metoprolol  - Patient currently admitted with SBO. No plans for surgery at this time. Patient now having bowel function and is on trial of liquids  - He has been on ASA rectally and IV cangrelor for  bridge therapy. Plan to resume PO ASA and Brilinta when patient tolerating PO intake  - OK to hold statin and metoprolol until on PO medications   For questions or updates, please contact [facility] Please consult [email or web address] for contact info under        Signed, Jonita Albee, PA-C   Patient seen and examined.  Agree with above documentation.  On exam, patient is alert and oriented, regular rate and rhythm, no murmurs, lungs CTAB, no LE edema or JVD.  Reports multiple BMs overnight.  Plan per surgery is if tolerating diet can resume DAPT.  Continue IV cangrelor and rectal ASA in meantime.  Little Ishikawa, MD

## 2022-10-25 DIAGNOSIS — K56609 Unspecified intestinal obstruction, unspecified as to partial versus complete obstruction: Secondary | ICD-10-CM | POA: Diagnosis not present

## 2022-10-25 LAB — BASIC METABOLIC PANEL
Anion gap: 7 (ref 5–15)
BUN: 16 mg/dL (ref 6–20)
CO2: 22 mmol/L (ref 22–32)
Calcium: 8.6 mg/dL — ABNORMAL LOW (ref 8.9–10.3)
Chloride: 109 mmol/L (ref 98–111)
Creatinine, Ser: 2.31 mg/dL — ABNORMAL HIGH (ref 0.61–1.24)
GFR, Estimated: 33 mL/min — ABNORMAL LOW (ref >60)
Glucose, Bld: 85 mg/dL (ref 70–99)
Potassium: 4.1 mmol/L (ref 3.5–5.1)
Sodium: 138 mmol/L (ref 135–145)

## 2022-10-25 LAB — CBC
HCT: 38.7 % — ABNORMAL LOW (ref 39.0–52.0)
Hemoglobin: 12.1 g/dL — ABNORMAL LOW (ref 13.0–17.0)
MCH: 28.3 pg (ref 26.0–34.0)
MCHC: 31.3 g/dL (ref 30.0–36.0)
MCV: 90.6 fL (ref 80.0–100.0)
Platelets: 186 10*3/uL (ref 150–400)
RBC: 4.27 MIL/uL (ref 4.22–5.81)
RDW: 12.2 % (ref 11.5–15.5)
WBC: 7.1 10*3/uL (ref 4.0–10.5)
nRBC: 0 % (ref 0.0–0.2)

## 2022-10-25 MED ORDER — MAGNESIUM OXIDE -MG SUPPLEMENT 400 (240 MG) MG PO TABS
400.0000 mg | ORAL_TABLET | Freq: Every day | ORAL | Status: DC
  Administered 2022-10-25: 400 mg via ORAL
  Filled 2022-10-25: qty 1

## 2022-10-25 MED ORDER — ATORVASTATIN CALCIUM 80 MG PO TABS
80.0000 mg | ORAL_TABLET | Freq: Every day | ORAL | Status: DC
  Administered 2022-10-25: 80 mg via ORAL
  Filled 2022-10-25: qty 1

## 2022-10-25 MED ORDER — VITAMIN C 500 MG PO TABS
1000.0000 mg | ORAL_TABLET | ORAL | Status: DC
  Administered 2022-10-25: 1000 mg via ORAL
  Filled 2022-10-25: qty 2

## 2022-10-25 MED ORDER — METOPROLOL SUCCINATE ER 25 MG PO TB24
25.0000 mg | ORAL_TABLET | Freq: Every day | ORAL | Status: DC
  Administered 2022-10-25: 25 mg via ORAL
  Filled 2022-10-25: qty 1

## 2022-10-25 NOTE — Progress Notes (Signed)
   Patient Name: Chase Walsh Date of Encounter: 10/25/2022 [facility] Cardiologist: Little Ishikawa, MD   Interval Summary  .    Denies any chest pain or dyspnea  Vital Signs .    Vitals:   10/24/22 1526 10/24/22 2011 10/25/22 0631 10/25/22 0909  BP: (!) 143/87 (!) 131/90 (!) 135/95 130/88  Pulse: 68 74 65 71  Resp: 18 18 17 18   Temp: 99.6 F (37.6 C) 98.3 F (36.8 C) 98 F (36.7 C) 98.3 F (36.8 C)  TempSrc: Oral Oral Oral Oral  SpO2: 100% 100% 98% 99%  Weight:      Height:        Intake/Output Summary (Last 24 hours) at 10/25/2022 1116 Last data filed at 10/25/2022 0900 Gross per 24 hour  Intake 3233.54 ml  Output --  Net 3233.54 ml      10/22/2022    4:26 PM 10/19/2022    9:56 AM 10/11/2022    9:30 AM  Last 3 Weights  Weight (lbs) 225 lb 15.5 oz 226 lb 222 lb 12.8 oz  Weight (kg) 102.5 kg 102.513 kg 101.061 kg      Telemetry/ECG    NSR  - Personally Reviewed  Physical Exam .   GEN: No acute distress.  Sitting upright in the bed Neck: No JVD Cardiac: RRR, no murmurs, rubs, or gallops. Radial pulses 2+ bilaterally  Respiratory: Breathing unlabored  GI: Soft, nontender, non-distended  MS: No edema in BLE   Assessment & Plan .     CAD with Recent STEMI s/p DES to Lcx. Patient was hospitalized 10/01/22 in [place], [place] for STEMI. Course complicated by V-fib arrest. Underwent Cath on 9/30 that showed distal Lcx occlusion that was treated with DES. There was also moderate disease of the LAD and RCA that was managed medially. Echocardiogram from 10/03/22 showed preserved EF, no significant valvular disease. Discharged on ASA 81 mg daily, Brilinta 90 mg BID, lipitor, metoprolol  -Admitted with SBO, was n.p.o. and switch to rectal aspirin and IV cangrelor.  Now tolerating diet, has been switched back to p.o. aspirin 81 mg and ticagrelor 90 mg twice daily (was reloaded with ticagrelor last night) -Also restarted on atorvastatin and  metoprolol  SBO: General Surgery consulted, no surgical intervention needed, responded to conservative treatment.  Now tolerating diet   [facility] will sign off.   Medication Recommendations: Aspirin 81 mg daily, ticagrelor 90 mg twice daily, Toprol-XL 25 mg daily, atorvastatin 80 mg daily Other recommendations (labs, testing, etc): None Follow up as an outpatient: Will schedule   For questions or updates, please contact  [facility] Please consult [email or web address] for contact info under        Signed, Little Ishikawa, MD

## 2022-10-25 NOTE — Discharge Summary (Signed)
DISCHARGE SUMMARY  Chase Walsh  MR#: [id number]  DOB:09/24/1970  Date of Admission: 10/22/2022 Date of Discharge: 10/25/2022  Attending Physician:Lilie Vezina Silvestre Gunner, MD  Patient's WUJ:[id number], Chase Cram, FNP  Disposition: D/C home   Follow-up Appts:  Follow-up Information     [name], FNP Follow up in 1 week(s).   Specialty: General Practice Contact information: [address] [place] [address] [phone]                 Discharge Diagnoses: SBO Elevated lipase CAD with VF arrest/NSTEMI September 2024 CKD stage IIIb HTN Mixed hyperlipidemia  Initial presentation: 52 year old with a history of CAD status post V-fib arrest/NSTEMI September 2024 with DES to distal left circumflex, CKD stage III, HTN, HLD, and gout who presented to the ER 10/20 with abdominal pain and vomiting. Evaluation revealed a SBO.   Hospital Course:  SBO Surgical intervention was not required -clinical course monitored and directed by general surgery team -NG removed 10/22-tolerating diet well at time of discharge   Elevated lipase Likely secondary to SBO alone -imaging of pancreas unrevealing -no symptoms to suggest a diagnosis of pancreatitis   CAD with VF arrest/NSTEMI September 2024 Status post DES to the distal left circumflex -on DAPT -asymptomatic during this hospital stay -TTE 10/03/2022 noted preserved EF with no valvular disease -was covered with aspirin and IV platelet medication during time that he was unable to take consistent oral intake -resumed usual DAPT at time of discharge   CKD stage IIIb Baseline creatinine 2.3-2.4 -stable at baseline at time of discharge   HTN Well-controlled at time of discharge   Mixed hyperlipidemia Resumed high intensity statin prior to discharge  Allergies as of 10/25/2022       Reactions   Allopurinol Rash        Medication List     TAKE these medications    aspirin 81 MG chewable tablet Chew 1 tablet  by mouth daily.   atorvastatin 80 MG tablet Commonly known as: LIPITOR Take 1 tablet by mouth daily.   fluocinonide 0.05 % external solution Commonly known as: LIDEX Apply 1 application  topically as needed (breakout).   magnesium oxide 400 (240 Mg) MG tablet Commonly known as: MAG-OX Take 400 mg by mouth daily.   metoprolol succinate 25 MG 24 hr tablet Commonly known as: TOPROL-XL Take 25 mg by mouth daily.   nitroGLYCERIN 0.4 MG SL tablet Commonly known as: NITROSTAT Place 0.4 mg under the tongue every 5 (five) minutes as needed for chest pain.   ticagrelor 90 MG Tabs tablet Commonly known as: BRILINTA Take 90 mg by mouth 2 (two) times daily.   vitamin C 1000 MG tablet Take 1,000 mg by mouth every other day.        Day of Discharge BP 130/88 (BP Location: Right Arm)   Pulse 71   Temp 98.3 F (36.8 C) (Oral)   Resp 18   Ht 5\' 8"  (1.727 m)   Wt 102.5 kg   SpO2 99%   BMI 34.36 kg/m   Physical Exam: General: No acute respiratory distress Lungs: Clear to auscultation bilaterally without wheezes or crackles Cardiovascular: Regular rate and rhythm without murmur gallop or rub normal S1 and S2 Abdomen: Nontender, nondistended, soft, bowel sounds positive, no rebound, no ascites, no appreciable mass Extremities: No significant cyanosis, clubbing, or edema bilateral lower extremities  Basic Metabolic Panel: Recent Labs  Lab 10/22/22 1321 10/22/22 2033 10/23/22 1554 10/24/22 0837 10/25/22 0459  NA 137  --  137 142 138  K 4.4  --  4.3 4.2 4.1  CL 102  --  108 110 109  CO2 24  --  22 23 22   GLUCOSE 119*  --  133* 101* 85  BUN 20  --  23* 20 16  CREATININE 2.54*  --  2.35* 2.31* 2.31*  CALCIUM 10.0  --  8.4* 9.1 8.6*  MG  --  2.3  --   --   --     CBC: Recent Labs  Lab 10/22/22 1321 10/23/22 1554 10/24/22 0837 10/25/22 0459  WBC 9.5 8.7 8.6 7.1  NEUTROABS 7.7  --   --   --   HGB 15.4 14.0 13.7 12.1*  HCT 48.5 44.4 44.1 38.7*  MCV 88.7 89.9 90.7  90.6  PLT 299 222 216 186   Time spent in discharge (includes decision making & examination of pt): 35 minutes  10/25/2022, 2:14 PM   Lonia Blood, MD [facility] Office  [phone]

## 2022-10-25 NOTE — Plan of Care (Signed)
  Problem: Education: Goal: Knowledge of General Education information will improve Description: Including pain rating scale, medication(s)/side effects and non-pharmacologic comfort measures Outcome: Progressing   Problem: Clinical Measurements: Goal: Ability to maintain clinical measurements within normal limits will improve Outcome: Progressing Goal: Will remain free from infection Outcome: Progressing   Problem: Activity: Goal: Risk for activity intolerance will decrease Outcome: Progressing   Problem: Nutrition: Goal: Adequate nutrition will be maintained Outcome: Progressing   Problem: Elimination: Goal: Will not experience complications related to bowel motility Outcome: Progressing Goal: Will not experience complications related to urinary retention Outcome: Progressing   Problem: Pain Managment: Goal: General experience of comfort will improve Outcome: Progressing

## 2022-10-26 ENCOUNTER — Telehealth: Payer: Self-pay

## 2022-10-26 NOTE — Transitions of Care (Post Inpatient/ED Visit) (Signed)
10/26/2022  Name: Chase Walsh MRN: [id number] DOB: 1970-03-27  Today's TOC FU Call Status: Today's TOC FU Call Status:: Successful TOC FU Call Completed TOC FU Call Complete Date: 10/26/22 Patient's Name and Date of Birth confirmed.  Transition Care Management Follow-up Telephone Call Date of Discharge: 10/25/22 Discharge Facility: [facility]) Type of Discharge: Inpatient Admission Primary Inpatient Discharge Diagnosis:: bowel obstruction How have you been since you were released from the hospital?: Better Any questions or concerns?: No  Items Reviewed: Did you receive and understand the discharge instructions provided?: Yes Medications obtained,verified, and reconciled?: Yes (Medications Reviewed) Any new allergies since your discharge?: No Dietary orders reviewed?: Yes Do you have support at home?: Yes People in Home: spouse  Medications Reviewed Today: Medications Reviewed Today     Reviewed by Karena Addison, LPN (Licensed Practical Nurse) on 10/26/22 at 1411  Med List Status: <None>   Medication Order Taking? Sig Documenting Provider Last Dose Status Informant  Ascorbic Acid (VITAMIN C) 1000 MG tablet [id number] No Take 1,000 mg by mouth every other day. [provider] 10/19/2022 Active Self  aspirin 81 MG chewable tablet [id number] No Chew 1 tablet by mouth daily. [provider] 10/21/2022 Active Self  atorvastatin (LIPITOR) 80 MG tablet [id number] No Take 1 tablet by mouth daily. [provider] 10/22/2022 Active Self, Pharmacy Records           Med Note (CRUTHIS, CHLOE C   Wed Oct 25, 2022 10:54 AM) Pt stated he was taking 80 mg tablet once daily. Dispense report does not support this claim.   fluocinonide (LIDEX) 0.05 % external solution [id number] No Apply 1 application  topically as needed (breakout). [provider] unk Active Self           Med Note (CRUTHIS, CHLOE Susy Frizzle Oct 23, 2022  8:37 AM) Pt is unsure of last dose.    magnesium oxide (MAG-OX) 400 (240 Mg) MG tablet [id number] No Take 400 mg by mouth daily. [provider] 10/20/2022 Active Self  metoprolol succinate (TOPROL-XL) 25 MG 24 hr tablet [id number] No Take 25 mg by mouth daily. [provider] 10/22/2022 Active Self, Pharmacy Records  nitroGLYCERIN (NITROSTAT) 0.4 MG SL tablet [id number] No Place 0.4 mg under the tongue every 5 (five) minutes as needed for chest pain. [provider] unk Active Self  ticagrelor (BRILINTA) 90 MG TABS tablet [id number] No Take 90 mg by mouth 2 (two) times daily. [provider] 10/22/2022 Active Self, Pharmacy Records            Home Care and Equipment/Supplies: Were Home Health Services Ordered?: NA Any new equipment or medical supplies ordered?: NA  Functional Questionnaire: Do you need assistance with bathing/showering or dressing?: No Do you need assistance with meal preparation?: No Do you need assistance with eating?: No Do you have difficulty maintaining continence: No Do you need assistance with getting out of bed/getting out of a chair/moving?: No Do you have difficulty managing or taking your medications?: No  Follow up appointments reviewed: PCP Follow-up appointment confirmed?: No (no avail appts, sent message to staff to schedule) Specialist Hospital Follow-up appointment confirmed?: NA Do you need transportation to your follow-up appointment?: No Do you understand care options if your condition(s) worsen?: Yes-patient verbalized understanding    SIGNATURE Karena Addison, LPN [facility] Nurse Health Advisor Direct Dial [phone]

## 2022-10-29 ENCOUNTER — Encounter: Payer: Self-pay | Admitting: Nurse Practitioner

## 2022-10-29 ENCOUNTER — Encounter: Payer: Self-pay | Admitting: Cardiology

## 2022-10-31 NOTE — Telephone Encounter (Signed)
Recommend discussing further at upcoming appointment

## 2022-10-31 NOTE — Telephone Encounter (Signed)
Spoke to patient Dr.Schumann's advice given.Stated his insurance changed and Dr.Schumann is not in his network.He will keep appointment with Dr.Schumann 11/15 at 10:00 am he will discuss then.

## 2022-11-01 ENCOUNTER — Ambulatory Visit: Payer: No Typology Code available for payment source | Admitting: Nurse Practitioner

## 2022-11-01 ENCOUNTER — Encounter: Payer: Self-pay | Admitting: Nurse Practitioner

## 2022-11-01 ENCOUNTER — Other Ambulatory Visit: Payer: Self-pay

## 2022-11-01 VITALS — BP 120/86 | HR 87 | Temp 98.1°F | Ht 68.0 in | Wt 214.4 lb

## 2022-11-01 DIAGNOSIS — I119 Hypertensive heart disease without heart failure: Secondary | ICD-10-CM

## 2022-11-01 DIAGNOSIS — Z6832 Body mass index (BMI) 32.0-32.9, adult: Secondary | ICD-10-CM

## 2022-11-01 DIAGNOSIS — K56609 Unspecified intestinal obstruction, unspecified as to partial versus complete obstruction: Secondary | ICD-10-CM | POA: Diagnosis not present

## 2022-11-01 DIAGNOSIS — Z9861 Coronary angioplasty status: Secondary | ICD-10-CM

## 2022-11-01 DIAGNOSIS — I252 Old myocardial infarction: Secondary | ICD-10-CM

## 2022-11-01 DIAGNOSIS — I129 Hypertensive chronic kidney disease with stage 1 through stage 4 chronic kidney disease, or unspecified chronic kidney disease: Secondary | ICD-10-CM

## 2022-11-01 DIAGNOSIS — E66811 Obesity, class 1: Secondary | ICD-10-CM

## 2022-11-01 DIAGNOSIS — N1832 Chronic kidney disease, stage 3b: Secondary | ICD-10-CM | POA: Diagnosis not present

## 2022-11-01 DIAGNOSIS — E6609 Other obesity due to excess calories: Secondary | ICD-10-CM

## 2022-11-01 DIAGNOSIS — Z09 Encounter for follow-up examination after completed treatment for conditions other than malignant neoplasm: Secondary | ICD-10-CM

## 2022-11-01 DIAGNOSIS — I2510 Atherosclerotic heart disease of native coronary artery without angina pectoris: Secondary | ICD-10-CM | POA: Diagnosis not present

## 2022-11-01 MED ORDER — ATORVASTATIN CALCIUM 80 MG PO TABS: 80.0000 mg | ORAL_TABLET | Freq: Every day | ORAL | 2 refills | Status: AC

## 2022-11-01 MED ORDER — METOPROLOL SUCCINATE ER 25 MG PO TB24: 25.0000 mg | ORAL_TABLET | Freq: Every day | ORAL | 2 refills | Status: AC

## 2022-11-01 MED ORDER — TICAGRELOR 90 MG PO TABS: 90.0000 mg | ORAL_TABLET | Freq: Two times a day (BID) | ORAL | 2 refills | Status: DC

## 2022-11-01 MED ORDER — ASPIRIN 81 MG PO CHEW: 81.0000 mg | CHEWABLE_TABLET | Freq: Every day | ORAL | 2 refills | Status: AC

## 2022-11-02 ENCOUNTER — Telehealth: Payer: Self-pay | Admitting: *Deleted

## 2022-11-02 NOTE — Progress Notes (Signed)
Care Coordination   Note   11/02/2022 Name: Ceferino Blumenstock MRN: [id number] DOB: Sep 08, 1970  Mauricio Dufrene is a 52 y.o. year old male who sees [name], FNP for primary care. I reached out to [name] Born by phone today to offer care coordination services.  Mr. Saucer was given information about Care Coordination services today including:   The Care Coordination services include support from the care team which includes your Nurse Coordinator, Clinical Social Worker, or Pharmacist.  The Care Coordination team is here to help remove barriers to the health concerns and goals most important to you. Care Coordination services are voluntary, and the patient may decline or stop services at any time by request to their care team member.   Care Coordination Consent Status: Patient agreed to services and verbal consent obtained.   Follow up plan:  Telephone appointment with care coordination team member scheduled for:  11/09/2022  Encounter Outcome:  Patient Scheduled from referral   Burman Nieves, [facility] Care Coordination Care Guide Direct Dial: [phone]

## 2022-11-07 ENCOUNTER — Encounter: Payer: Self-pay | Admitting: Nurse Practitioner

## 2022-11-07 DIAGNOSIS — E6609 Other obesity due to excess calories: Secondary | ICD-10-CM | POA: Insufficient documentation

## 2022-11-07 DIAGNOSIS — Z09 Encounter for follow-up examination after completed treatment for conditions other than malignant neoplasm: Secondary | ICD-10-CM | POA: Insufficient documentation

## 2022-11-07 NOTE — Assessment & Plan Note (Signed)
He is to f/u with Nephrology as his kidney functions declined slightly while in the hospital. Encouraged to avoid NSAIDs and to stay well hydrated within his limits

## 2022-11-07 NOTE — Assessment & Plan Note (Signed)
Continue current medications blood pressure has improved slightly. Continue f/u with Cardiology

## 2022-11-07 NOTE — Assessment & Plan Note (Signed)
he is encouraged to strive for BMI less than 30 to decrease cardiac risk. Advised to aim for at least 150 minutes of exercise per week.

## 2022-11-07 NOTE — Assessment & Plan Note (Signed)
TCM Performed. A member of the clinical team spoke with the patient upon dischare. Discharge summary was reviewed in full detail during the visit. Meds reconciled and compared to discharge meds. Medication list is updated and reviewed with the patient.  Greater than 50% face to face time was spent in counseling an coordination of care.  All questions were answered to the satisfaction of the patient.   He was placed on NGT and improved without surgical intervention.

## 2022-11-08 ENCOUNTER — Encounter: Payer: Self-pay | Admitting: Nurse Practitioner

## 2022-11-08 DIAGNOSIS — N1832 Chronic kidney disease, stage 3b: Secondary | ICD-10-CM | POA: Diagnosis not present

## 2022-11-09 ENCOUNTER — Ambulatory Visit: Payer: Self-pay | Admitting: Licensed Clinical Social Worker

## 2022-11-09 ENCOUNTER — Encounter: Payer: No Typology Code available for payment source | Attending: Nurse Practitioner | Admitting: Dietician

## 2022-11-09 DIAGNOSIS — I252 Old myocardial infarction: Secondary | ICD-10-CM | POA: Diagnosis not present

## 2022-11-09 DIAGNOSIS — K5792 Diverticulitis of intestine, part unspecified, without perforation or abscess without bleeding: Secondary | ICD-10-CM | POA: Diagnosis not present

## 2022-11-09 DIAGNOSIS — Z713 Dietary counseling and surveillance: Secondary | ICD-10-CM | POA: Diagnosis not present

## 2022-11-09 DIAGNOSIS — K5900 Constipation, unspecified: Secondary | ICD-10-CM | POA: Insufficient documentation

## 2022-11-09 DIAGNOSIS — N189 Chronic kidney disease, unspecified: Secondary | ICD-10-CM | POA: Diagnosis not present

## 2022-11-09 DIAGNOSIS — M109 Gout, unspecified: Secondary | ICD-10-CM | POA: Diagnosis present

## 2022-11-09 DIAGNOSIS — I213 ST elevation (STEMI) myocardial infarction of unspecified site: Secondary | ICD-10-CM

## 2022-11-09 LAB — LAB REPORT - SCANNED
Albumin, Urine POC: 160
Albumin/Creatinine Ratio, Urine, POC: 184
Creatinine, POC: 87.1 mg/dL

## 2022-11-09 NOTE — Progress Notes (Signed)
Medical Nutrition Therapy  Appointment Start time:  [id number]  Appointment End time:  925  Primary concerns today: gout, heart health, chronic kidney disease, constipation, diverticulitis Referral diagnosis: ST elevation myocardial infarction involving left circumflex coronary artery (HCC)  Preferred learning style:  no preference indicated Learning readiness: change in progress   NUTRITION ASSESSMENT  Clinical Medical Hx:  Past Medical History:  Diagnosis Date   Allergy    Class 1 obesity due to excess calories with serious comorbidity and body mass index (BMI) of 33.0 to 33.9 in adult 07/08/2018   Diverticulosis    Fall 11/21/2018   Gout    Hematochezia 04/21/2016   History of COVID-19 02/05/2019   Hypertension    Lower GI bleed    Myocardial infarction (HCC) 10/01/22   Vitiligo     Medications:  Current Outpatient Medications:    Ascorbic Acid (VITAMIN C) 1000 MG tablet, Take 1,000 mg by mouth every other day., Disp: , Rfl:    aspirin 81 MG chewable tablet, Chew 1 tablet (81 mg total) by mouth daily., Disp: 90 tablet, Rfl: 2   atorvastatin (LIPITOR) 80 MG tablet, Take 1 tablet (80 mg total) by mouth daily., Disp: 90 tablet, Rfl: 2   magnesium oxide (MAG-OX) 400 (240 Mg) MG tablet, Take 400 mg by mouth daily., Disp: , Rfl:    metoprolol succinate (TOPROL-XL) 25 MG 24 hr tablet, Take 1 tablet (25 mg total) by mouth daily., Disp: 90 tablet, Rfl: 2   ticagrelor (BRILINTA) 90 MG TABS tablet, Take 1 tablet (90 mg total) by mouth 2 (two) times daily., Disp: 60 tablet, Rfl: 2   fluocinonide (LIDEX) 0.05 % external solution, Apply 1 application  topically as needed (breakout)., Disp: , Rfl:    nitroGLYCERIN (NITROSTAT) 0.4 MG SL tablet, Place 0.4 mg under the tongue every 5 (five) minutes as needed for chest pain., Disp: , Rfl:   Labs: Last vitamin D No results found for: "25OHVITD2", "25OHVITD3", "VD25OH" Lab Results  Component Value Date   CHOL 174 08/29/2022   HDL 27 (L)  08/29/2022   LDLCALC 111 (H) 08/29/2022   TRIG 203 (H) 08/29/2022   CHOLHDL 6.4 (H) 08/29/2022   Lab Results  Component Value Date   NA 138 10/25/2022   CL 109 10/25/2022   K 4.1 10/25/2022   CO2 22 10/25/2022   BUN 16 10/25/2022   CREATININE 2.31 (H) 10/25/2022   GFRNONAA 33 (L) 10/25/2022   CALCIUM 8.6 (L) 10/25/2022   ALBUMIN 4.2 10/22/2022   GLUCOSE 85 10/25/2022   Lab Results  Component Value Date   LABURIC 10.2 (H) 02/28/2022   Lab Results  Component Value Date   HGBA1C 5.4 08/29/2022   Lifestyle & Dietary Hx Pt present with his wife, "Adela Lank". Pt present with crutches and stating a gout flare up.  Pt reports he has omitted pork and shellfish from his diet and is limiting beef intake. Pt reports he avoids fried foods. Wife procures and prepares meals. Pt's wife states limiting sodium when cooking.  Pt c/o constipation. Pt reports selecting cherry juice for gout flare improvements.    Estimated daily fluid intake: 4 bottles 16.9 or 20 ounces  Supplements: vitamin C, mag ox, Sleep: poor average hours of 4  Stress / self-care: 8 of out 10/ prayer Current average weekly physical activity: sedentary   24-Hr Dietary Recall First Meal: smoothie made with apple juice, frozen mangos berries, peanut butter, cucumber, sweet potatoes, brussels sprouts, broccoli Snack: none Second Meal: skips  Snack: apple or grapes or  apricots or peach or pineapple or none  Third Meal: air fired chicken wings, baked beans, grits, water  Snack: none Beverages: 1 cup juice, water, occasionally mushroom coffee with vanilla creamer   NUTRITION DIAGNOSIS  NB-1.1 Food and nutrition-related knowledge deficit As related to no prior nutrition related education.  As evidenced by Pt reports and dietary recall.   NUTRITION INTERVENTION  Nutrition education (E-1) on the following topics:  Fruits & Vegetables: Aim to fill half your plate with a variety of fruits and vegetables. They are rich in  vitamins, minerals, and fiber, and can help reduce the risk of chronic diseases. Choose a colorful assortment of fruits and vegetables to ensure you get a wide range of nutrients and limiting high purines to 1/2 cup daily. Grains and Starches: Make at least half of your grain choices whole grains, such as brown rice, whole wheat bread, and oats. Whole grains provide fiber, which aids in digestion and healthy cholesterol levels. Aim for whole forms of starchy vegetables such as potatoes, sweet potatoes, beans, peas, and corn, which are fiber rich and provide many vitamins and minerals.  Protein: Limiting Purine intake while Incorporating lean sources of protein, such as poultry, fish, beans, into your meals.  Dairy: Include low-fat or fat-free dairy products like milk, yogurt, and cheese in your diet. Dairy foods are excellent sources of calcium and vitamin D, which are crucial for bone health.  Physical Activity: Aim for 60 minutes of physical activity daily. Regular physical activity promotes overall health-including helping to reduce risk for heart disease and diabetes, promoting mental health, and helping [place] sleep better.   Handouts Provided Include  Building a Heart Healthy Plate Snack List Low Purine-Purine Restricted Meal Pattern Low Sodium Seasoning Tips Dietary Fats and Your Heart    Learning Style & Readiness for Change Teaching method utilized: Visual & Auditory  Demonstrated degree of understanding via: Teach Back  Barriers to learning/adherence to lifestyle change: PHQ9 = 14  Goals Established by Pt 1- Use nutrition labels to selects foods lower in sodium and saturated fat   MONITORING & EVALUATION Dietary intake, weekly physical activity  Next Steps  Patient is to PRN.

## 2022-11-09 NOTE — Patient Instructions (Addendum)
1- Use nutrition labels to selects foods lower in sodium and saturated fat

## 2022-11-09 NOTE — Patient Instructions (Signed)
Visit Information  Thank you for taking time to visit with me today. Please don't hesitate to contact me if I can be of assistance to you.   Following are the goals we discussed today:   Goals Addressed             This Visit's Progress    Care Coordination Activites       Care Coordination Interventions: Patient needed help with applying for Medicaid, the Patient  has applied and was approved but denied for retro pay for medical bills. The Sw encouraged the patient to contact the case worker at DSS to talk about the old medical bills in [place] and in [place] Patient stated that he also has applied for Food Stamps and it is pending he had to send in his disability information , but needed resources for food. The Sw will mail or food pantry resources.  Sw will follow up with patient in 2 to 3 weeks.          Our next appointment is by telephone on 11/23/2022 at 2:00pm  Please call the care guide team at [phone] if you need to cancel or reschedule your appointment.   If you are experiencing a Mental Health or Behavioral Health Crisis or need someone to talk to, please call the Suicide and Crisis Lifeline: 988 go to [facility] [address], [place] [phone]) call 911  Patient verbalizes understanding of instructions and care plan provided today and agrees to view in MyChart. Active MyChart status and patient understanding of how to access instructions and care plan via MyChart confirmed with patient.     Jeanie Cooks, PhD [facility], [facility] Social Worker Direct Dial: [phone]  Fax: [phone]

## 2022-11-09 NOTE — Patient Outreach (Signed)
  Care Coordination   Initial Visit Note   11/09/2022 Name: Chase Walsh MRN: [id number] DOB: 07-17-1970  Chase Walsh is a 52 y.o. year old male who sees [name], FNP for primary care. I spoke with  [name] Born by phone today.  What matters to the patients health and wellness today?  Medicaid Application and Food Insecurities     Goals Addressed             This Visit's Progress    Care Coordination Activites       Care Coordination Interventions: Patient needed help with applying for Medicaid, the Patient  has applied and was approved but denied for retro pay for medical bills. The Sw encouraged the patient to contact the case worker at DSS to talk about the old medical bills in [place] and in [place] Patient stated that he also has applied for Food Stamps and it is pending he had to send in his disability information , but needed resources for food. The Sw will mail or food pantry resources.  Sw will follow up with patient in 2 to 3 weeks.          SDOH assessments and interventions completed:  Yes     Care Coordination Interventions:  Yes, provided  Interventions Today    Flowsheet Row Most Recent Value  General Interventions   General Interventions Discussed/Reviewed General Interventions Discussed, Sick Day Rules  [Patient needed hwlp with applying fo rMedicaid and has applied and been approved already]        Follow up plan: Follow up call scheduled for 11/23/2022 at 2:00 pm    Encounter Outcome:  Patient Visit Completed   Jeanie Cooks, PhD [facility], [facility] Social Worker Direct Dial: [phone]  Fax: [phone]

## 2022-11-14 NOTE — Addendum Note (Signed)
Addended by: [name] F on: 11/14/2022 12:53 PM   Modules accepted: Level of Service

## 2022-11-16 NOTE — Progress Notes (Signed)
[facility] Office Note:    Date:  11/17/2022   ID:  Chase Walsh, DOB April 28, 1970, MRN [id number]  PCP:  [name], FNP  Cardiologist:  Little Ishikawa, MD  Electrophysiologist:  None   Referring MD: [name], FNP   Chief Complaint  Patient presents with   Coronary Artery Disease    History of Present Illness:    Chase Walsh is a 52 y.o. male with a hx of CAD s/p STEMI 09/2022 complicated by VF arrest, hypertension, hyperlipidemia, CKD stage IV who is referred by [name], NP for evaluation of CAD.  He was admitted 10/01/2022 in [place] with VF arrest due to STEMI.  Underwent LHC which showed distal LCx occlusion status post DES, also noted to have moderate disease in LAD and RCA.  Echocardiogram showed normal LVEF, no significant valvular disease.    He was admitted 10/2022 with some small bowel obstruction.  Managed conservatively, required starting IV cangrelor as all p.o. medications were held.  SBO resolved with conservative management and he was transitioned back to p.o. medications  Since last clinic visit, he reports he is doing okay.  Continues to feel fatigued.  Continues to have shortness of breath.  Denies any chest pain.  Having some issues with gout in his left ankle.  Reports compliance with DAPT, denies any bleeding issues.   Past Medical History:  Diagnosis Date   Allergy    Class 1 obesity due to excess calories with serious comorbidity and body mass index (BMI) of 33.0 to 33.9 in adult 07/08/2018   Diverticulosis    Fall 11/21/2018   Gout    Hematochezia 04/21/2016   History of COVID-19 02/05/2019   Hypertension    Lower GI bleed    Myocardial infarction (HCC) 10/01/22   Vitiligo     Past Surgical History:  Procedure Laterality Date   COLONOSCOPY N/A 04/22/2016   Procedure: COLONOSCOPY;  Surgeon: Sherrilyn Rist, MD;  Location: [facility] ENDOSCOPY;  Service: Endoscopy;  Laterality: N/A;   HERNIA REPAIR       Current Medications: Current Meds  Medication Sig   Ascorbic Acid (VITAMIN C) 1000 MG tablet Take 1,000 mg by mouth every other day.   aspirin 81 MG chewable tablet Chew 1 tablet (81 mg total) by mouth daily.   atorvastatin (LIPITOR) 80 MG tablet Take 1 tablet (80 mg total) by mouth daily.   fluocinonide (LIDEX) 0.05 % external solution Apply 1 application  topically as needed (breakout).   magnesium oxide (MAG-OX) 400 (240 Mg) MG tablet Take 400 mg by mouth daily.   metoprolol succinate (TOPROL-XL) 25 MG 24 hr tablet Take 1 tablet (25 mg total) by mouth daily.   ticagrelor (BRILINTA) 90 MG TABS tablet Take 1 tablet (90 mg total) by mouth 2 (two) times daily.     Allergies:   Allopurinol   Social History   Socioeconomic History   Marital status: Married    Spouse name: Not on file   Number of children: Not on file   Years of education: Not on file   Highest education level: 12th grade  Occupational History   Not on file  Tobacco Use   Smoking status: Never   Smokeless tobacco: Never  Substance and Sexual Activity   Alcohol use: No   Drug use: No   Sexual activity: Yes    Partners: Female    Comment: married, monogamous  Other Topics Concern   Not on file  Social History Narrative   Not on file   Social Determinants of Health   Financial Resource Strain: Low Risk  (05/01/2022)   Overall Financial Resource Strain (CARDIA)    Difficulty of Paying Living Expenses: Not very hard  Food Insecurity: No Food Insecurity (10/22/2022)   Hunger Vital Sign    Worried About Running Out of Food in the Last Year: Never true    Ran Out of Food in the Last Year: Never true  Transportation Needs: No Transportation Needs (10/22/2022)   PRAPARE - Administrator, Civil Service (Medical): No    Lack of Transportation (Non-Medical): No  Physical Activity: Insufficiently Active (05/01/2022)   Exercise Vital Sign    Days of Exercise per Week: 3 days    Minutes of Exercise  per Session: 40 min  Stress: No Stress Concern Present (05/01/2022)   [employer] of Occupational Health - Occupational Stress Questionnaire    Feeling of Stress : Not at all  Social Connections: Socially Integrated (05/01/2022)   Social Connection and Isolation Panel [NHANES]    Frequency of Communication with Friends and Family: More than three times a week    Frequency of Social Gatherings with Friends and Family: Once a week    Attends Religious Services: More than 4 times per year    Active Member of [employer] or Organizations: Yes    Attends Engineer, structural: More than 4 times per year    Marital Status: Married     Family History: The patient's family history includes Arthritis in his mother; Breast cancer in his mother; Cancer in his mother and paternal uncle; Diabetes in his brother and sister; Kidney disease in his brother and brother; Obesity in his brother, mother, and sister; Stroke in his father and sister. There is no history of Inflammatory bowel disease.  ROS:   Please see the history of present illness.     All other systems reviewed and are negative.  EKGs/Labs/Other Studies Reviewed:    The following studies were reviewed today:   EKG:   10/19/22: Sinus bradycardia, rate 56, Q waves in leads III, aVF 11/17/2022: Normal sinus rhythm, rate 63, Q waves in leads III, aVF  Recent Labs: 10/22/2022: ALT 36; Magnesium 2.3 10/25/2022: BUN 16; Creatinine, Ser 2.31; Hemoglobin 12.1; Platelets 186; Potassium 4.1; Sodium 138  Recent Lipid Panel    Component Value Date/Time   CHOL 174 08/29/2022 1501   TRIG 203 (H) 08/29/2022 1501   HDL 27 (L) 08/29/2022 1501   CHOLHDL 6.4 (H) 08/29/2022 1501   LDLCALC 111 (H) 08/29/2022 1501    Physical Exam:    VS:  BP 116/82 (BP Location: Left Arm, Patient Position: Sitting, Cuff Size: Large)   Pulse 63   Ht 5\' 9"  (1.753 m)   Wt 216 lb 3.2 oz (98.1 kg)   SpO2 96%   BMI 31.93 kg/m     Wt Readings from Last 3  Encounters:  11/17/22 216 lb 3.2 oz (98.1 kg)  11/01/22 214 lb 6.4 oz (97.3 kg)  10/22/22 225 lb 15.5 oz (102.5 kg)     GEN:  Well nourished, well developed in no acute distress HEENT: Normal NECK: No JVD; No carotid bruits LYMPHATICS: No lymphadenopathy CARDIAC: RRR, no murmurs, rubs, gallops RESPIRATORY:  Clear to auscultation without rales, wheezing or rhonchi  ABDOMEN: Soft, non-tender, non-distended MUSCULOSKELETAL:  No edema; No deformity  SKIN: Warm and dry NEUROLOGIC:  Alert and oriented x 3 PSYCHIATRIC:  Normal affect  ASSESSMENT:    1. Coronary artery disease involving native coronary artery of native heart, unspecified whether angina present   2. Benign hypertension with CKD (chronic kidney disease) stage III (HCC)   3. SOB (shortness of breath)   4. Chronic kidney disease (CKD), stage IV (severe) (HCC)   5. Mixed hyperlipidemia     PLAN:    CAD: admitted 10/01/2022 in [place] with VF arrest due to STEMI.  Underwent LHC which showed distal LCx occlusion status post DES, also noted to have moderate disease in LAD and RCA.  Echocardiogram showed normal LVEF, no significant valvular disease.   -Continue aspirin 81 mg daily, ticagrelor 90 mg twice daily.  Continue DAPT through 09/2023, aspirin indefinitely -Continue atorvastatin 80 mg daily -Continue Toprol-XL 25 mg daily -Referred to cardiac rehab but reports insurance not covering.  He has since started on Medicaid, can try referring again -Reports continues to have dyspnea, will check echocardiogram  Hyperlipidemia: On atorvastatin 80 mg daily.  LDL 56 on 09/2022  Hypertension: On Toprol-XL 25 mg daily.  Appears controlled  CKD stage IV: Creatinine 2.31 on 10/25/2022.  Follows with nephrology, reports starting Farxiga  RTC in 3 months   Medication Adjustments/Labs and Tests Ordered: Current medicines are reviewed at length with the patient today.  Concerns regarding medicines are outlined above.   Orders Placed This Encounter  Procedures   EKG 12-Lead   ECHOCARDIOGRAM COMPLETE   No orders of the defined types were placed in this encounter.   Patient Instructions  Medication Instructions:  No changes    *If you need a refill on your cardiac medications before your next appointment, please call your pharmacy*   Lab Work: None    If you have labs (blood work) drawn today and your tests are completely normal, you will receive your results only by: MyChart Message (if you have MyChart) OR A paper copy in the mail If you have any lab test that is abnormal or we need to change your treatment, we will call you to review the results.   Testing/Procedures: Echo will be scheduled at [address].  Your physician has requested that you have an echocardiogram. Echocardiography is a painless test that uses sound waves to create images of your heart. It provides your doctor with information about the size and shape of your heart and how well your heart's chambers and valves are working. This procedure takes approximately one hour. There are no restrictions for this procedure. Please do NOT wear cologne, perfume, aftershave, or lotions (deodorant is allowed). Please arrive 15 minutes prior to your appointment time.    Follow-Up: At [facility], you and your health needs are our priority.  As part of our continuing mission to provide you with exceptional heart care, we have created designated Provider Care Teams.  These Care Teams include your primary Cardiologist (physician) and Advanced Practice Providers (APPs -  Physician Assistants and Nurse Practitioners) who all work together to provide you with the care you need, when you need it.  We recommend signing up for the patient portal called "MyChart".  Sign up information is provided on this After Visit Summary.  MyChart is used to connect with patients for Virtual Visits (Telemedicine).  Patients are able to view  lab/test results, encounter notes, upcoming appointments, etc.  Non-urgent messages can be sent to your provider as well.   To learn more about what you can do with MyChart, go to [email or web address].  Your next appointment:   2 month(s): KEEP NEXT APPOINTMENT SCHEDULED FOR January 31, 2023 AT 8:00AM  The format for your next appointment:   In Person  Provider:   Little Ishikawa, MD    Other Instructions    Signed, Little Ishikawa, MD  11/17/2022 10:53 AM    [facility]

## 2022-11-17 ENCOUNTER — Other Ambulatory Visit: Payer: Self-pay

## 2022-11-17 ENCOUNTER — Ambulatory Visit: Payer: No Typology Code available for payment source | Attending: Cardiology | Admitting: Cardiology

## 2022-11-17 ENCOUNTER — Encounter: Payer: Self-pay | Admitting: Cardiology

## 2022-11-17 VITALS — BP 116/82 | HR 63 | Ht 69.0 in | Wt 216.2 lb

## 2022-11-17 DIAGNOSIS — I129 Hypertensive chronic kidney disease with stage 1 through stage 4 chronic kidney disease, or unspecified chronic kidney disease: Secondary | ICD-10-CM

## 2022-11-17 DIAGNOSIS — R0602 Shortness of breath: Secondary | ICD-10-CM | POA: Diagnosis not present

## 2022-11-17 DIAGNOSIS — E782 Mixed hyperlipidemia: Secondary | ICD-10-CM

## 2022-11-17 DIAGNOSIS — I2510 Atherosclerotic heart disease of native coronary artery without angina pectoris: Secondary | ICD-10-CM | POA: Diagnosis not present

## 2022-11-17 DIAGNOSIS — N184 Chronic kidney disease, stage 4 (severe): Secondary | ICD-10-CM | POA: Diagnosis not present

## 2022-11-17 DIAGNOSIS — N183 Chronic kidney disease, stage 3 unspecified: Secondary | ICD-10-CM

## 2022-11-17 DIAGNOSIS — I213 ST elevation (STEMI) myocardial infarction of unspecified site: Secondary | ICD-10-CM

## 2022-11-17 NOTE — Patient Instructions (Addendum)
Medication Instructions:  No changes    *If you need a refill on your cardiac medications before your next appointment, please call your pharmacy*   Lab Work: None    If you have labs (blood work) drawn today and your tests are completely normal, you will receive your results only by: MyChart Message (if you have MyChart) OR A paper copy in the mail If you have any lab test that is abnormal or we need to change your treatment, we will call you to review the results.   Testing/Procedures: Echo will be scheduled at [address].  Your physician has requested that you have an echocardiogram. Echocardiography is a painless test that uses sound waves to create images of your heart. It provides your doctor with information about the size and shape of your heart and how well your heart's chambers and valves are working. This procedure takes approximately one hour. There are no restrictions for this procedure. Please do NOT wear cologne, perfume, aftershave, or lotions (deodorant is allowed). Please arrive 15 minutes prior to your appointment time.    Follow-Up: At [facility], you and your health needs are our priority.  As part of our continuing mission to provide you with exceptional heart care, we have created designated Provider Care Teams.  These Care Teams include your primary Cardiologist (physician) and Advanced Practice Providers (APPs -  Physician Assistants and Nurse Practitioners) who all work together to provide you with the care you need, when you need it.  We recommend signing up for the patient portal called "MyChart".  Sign up information is provided on this After Visit Summary.  MyChart is used to connect with patients for Virtual Visits (Telemedicine).  Patients are able to view lab/test results, encounter notes, upcoming appointments, etc.  Non-urgent messages can be sent to your provider as well.   To learn more about what you can do with MyChart, go to  [email or web address].    Your next appointment:   2 month(s): KEEP NEXT APPOINTMENT SCHEDULED FOR January 31, 2023 AT 8:00AM  The format for your next appointment:   In Person  Provider:   Little Ishikawa, MD    Other Instructions

## 2022-11-21 ENCOUNTER — Encounter: Payer: Self-pay | Admitting: Cardiology

## 2022-11-21 ENCOUNTER — Telehealth (HOSPITAL_COMMUNITY): Payer: Self-pay | Admitting: *Deleted

## 2022-11-21 NOTE — Telephone Encounter (Signed)
Returned call to pt who left message on the departmental voicemail regarding if we have a referral for Cardiac Rehab.  Advised pt that yes we do have the referral and a medicaid form has been sent to Dr. Bjorn Pippin to completed.  Medicaid will cover CR however there are certain criteria that the pt must have as determined by by the provider.  Once we receive the form back, will contact him for scheduling. Alanson Aly, BSN Cardiac and Emergency planning/management officer

## 2022-11-23 ENCOUNTER — Ambulatory Visit: Payer: Self-pay | Admitting: Licensed Clinical Social Worker

## 2022-11-23 NOTE — Patient Instructions (Signed)
Visit Information  Thank you for taking time to visit with me today. Please don't hesitate to contact me if I can be of assistance to you.   Following are the goals we discussed today:   Goals Addressed             This Visit's Progress    COMPLETED: Care Coordination Activites       Care Coordination Interventions: Patient needed help with applying for Medicaid, the Patient  has applied and was approved but denied for retro pay for medical bills. The Sw encouraged the patient to contact the case worker at DSS to talk about the old medical bills in [place] and in [place] Patient stated that he also has applied for Food Stamps and it is pending he had to send in his disability information , but needed resources for food. The Sw will mail or food pantry resources.  Sw will follow up with patient in 2 to 3 weeks.          No further follow up Needed  Please call the care guide team at [phone] if you need to cancel or reschedule your appointment.   If you are experiencing a Mental Health or Behavioral Health Crisis or need someone to talk to, please call the Suicide and Crisis Lifeline: 988 go to [facility] [address] [place] [phone]) call 911  Patient verbalizes understanding of instructions and care plan provided today and agrees to view in MyChart. Active MyChart status and patient understanding of how to access instructions and care plan via MyChart confirmed with patient.      Marland Kitchenc

## 2022-11-23 NOTE — Patient Outreach (Signed)
  Care Coordination   Follow Up Visit Note   11/23/2022 Name: Aleksy Barrilleaux MRN: [id number] DOB: 1970-09-27  Jayven Boersma is a 52 y.o. year old male who sees [name], FNP for primary care. I spoke with  [name] Born by phone today.  What matters to the patients health and wellness today?  Food resources that the SW mailed     Goals Addressed             This Visit's Progress    COMPLETED: Care Coordination Activites       Care Coordination Interventions: Patient needed help with applying for Medicaid, the Patient  has applied and was approved but denied for retro pay for medical bills. The Sw encouraged the patient to contact the case worker at DSS to talk about the old medical bills in [place] and in [place] Patient stated that he also has applied for Food Stamps and it is pending he had to send in his disability information , but needed resources for food. The Sw will mail or food pantry resources.  Sw will follow up with patient in 2 to 3 weeks.          SDOH assessments and interventions completed:  Yes  SDOH Interventions Today    Flowsheet Row Most Recent Value  SDOH Interventions   Food Insecurity Interventions Intervention Not Indicated  [Patient received that food resources that the SW mailed]  Housing Interventions Intervention Not Indicated  Transportation Interventions Intervention Not Indicated  Utilities Interventions Intervention Not Indicated        Care Coordination Interventions:  Yes, provided  Interventions Today    Flowsheet Row Most Recent Value  General Interventions   General Interventions Discussed/Reviewed Community Resources, General Interventions Discussed  [Patient received the food pantry resources that the SW mailed]        Follow up plan: No further intervention required.   Encounter Outcome:  Patient Visit Completed   Jeanie Cooks, PhD [facility], [facility] Social Worker Direct  Dial: [phone]  Fax: [phone]

## 2022-11-24 ENCOUNTER — Ambulatory Visit (HOSPITAL_COMMUNITY)
Admission: RE | Admit: 2022-11-24 | Discharge: 2022-11-24 | Disposition: A | Discharge status: Home / Self Care | Payer: No Typology Code available for payment source | Source: Ambulatory Visit | Attending: Cardiology | Admitting: Cardiology

## 2022-11-24 DIAGNOSIS — R0602 Shortness of breath: Secondary | ICD-10-CM | POA: Diagnosis not present

## 2022-11-24 LAB — ECHOCARDIOGRAM COMPLETE
Area-P 1/2: 4.01 cm2
S' Lateral: 3 cm

## 2022-11-24 NOTE — Progress Notes (Signed)
  Echocardiogram 2D Echocardiogram has been performed.  Leda Roys RDCS 11/24/2022, 9:06 AM

## 2022-12-04 ENCOUNTER — Encounter: Payer: Self-pay | Admitting: Nurse Practitioner

## 2022-12-05 ENCOUNTER — Telehealth (HOSPITAL_COMMUNITY): Payer: Self-pay

## 2022-12-05 NOTE — Telephone Encounter (Signed)
Called pt for scheduling pt advised me that he ad an EKG done on 11/22 and they found moderate leakage from one of the heart valves and that he was concerned. I advised pt that I would pass his information to nurse navigator for further review before scheduling.

## 2022-12-06 ENCOUNTER — Encounter (HOSPITAL_COMMUNITY): Payer: Self-pay

## 2022-12-06 ENCOUNTER — Telehealth (HOSPITAL_COMMUNITY): Payer: Self-pay

## 2022-12-06 NOTE — Telephone Encounter (Signed)
Pt insurance is active and benefits verified through [employer] Medicaid Co-pay $4, DED 0/0 met, out of pocket 0/0 met, co-insurance 0%. no pre-authorization required, Valerie/[employer] 12/06/2022@1 :50, REF# [id number]   How many CR sessions are covered? (TCR ONLY)no limit Is this a lifetime maximum or an annual maximum? annual Has the member used any of these services to date? no Is there a time limit (weeks/months) on start of program and/or program completion? no

## 2022-12-06 NOTE — Telephone Encounter (Signed)
Pt called and was interested in the cardiac rehab program. Pt will come in for orientation on 12/5@8am  and will attend the 6:45 class time.   Sent package

## 2022-12-07 ENCOUNTER — Ambulatory Visit: Payer: Self-pay | Admitting: Cardiology

## 2022-12-07 ENCOUNTER — Encounter (HOSPITAL_COMMUNITY)
Admission: RE | Admit: 2022-12-07 | Discharge: 2022-12-07 | Disposition: A | Discharge status: Home / Self Care | Payer: No Typology Code available for payment source | Source: Ambulatory Visit | Attending: Cardiology | Admitting: Cardiology

## 2022-12-07 VITALS — BP 126/88 | HR 78 | Ht 69.0 in | Wt 216.1 lb

## 2022-12-07 DIAGNOSIS — I213 ST elevation (STEMI) myocardial infarction of unspecified site: Secondary | ICD-10-CM | POA: Insufficient documentation

## 2022-12-07 NOTE — Progress Notes (Signed)
Cardiac Rehab Medication Review   Does the patient  feel that his/her medications are working for him/her?  YES  Has the patient been experiencing any side effects to the medications prescribed?  YES  Does the patient measure his/her own blood pressure or blood glucose at home?  YES   Does the patient have any problems obtaining medications due to transportation or finances? NO  Understanding of regimen: excellent Understanding of indications: excellent Potential of compliance: excellent    Comments: Chase Walsh understands his medications and regime well. He checks his BP at home frequently. He states that since starting [place] he has been feeling "dry" and "dehydrated".     [name], MS, ACSM-CEP 12/07/2022 8:27 AM

## 2022-12-07 NOTE — Progress Notes (Signed)
Cardiac Individual Treatment Plan  Patient Details  Name: Chase Walsh MRN: [id number] Date of Birth: 07-10-1970 Referring Provider:   Flowsheet Row CARDIAC REHAB PHASE II ORIENTATION from 12/07/2022 in [facility]  Referring Provider Percell Belt, MD       Initial Encounter Date:  Flowsheet Row CARDIAC REHAB PHASE II ORIENTATION from 12/07/2022 in [facility]  Date 12/07/22       Visit Diagnosis: 10/01/22 ST elevation myocardial infarction (STEMI)  Patient's Home Medications on Admission:  Current Outpatient Medications:    Ascorbic Acid (VITAMIN C) 1000 MG tablet, Take 1,000 mg by mouth every other day., Disp: , Rfl:    aspirin 81 MG chewable tablet, Chew 1 tablet (81 mg total) by mouth daily., Disp: 90 tablet, Rfl: 2   atorvastatin (LIPITOR) 80 MG tablet, Take 1 tablet (80 mg total) by mouth daily., Disp: 90 tablet, Rfl: 2   dapagliflozin propanediol (FARXIGA) 10 MG TABS tablet, Take by mouth daily., Disp: , Rfl:    fluocinonide (LIDEX) 0.05 % external solution, Apply 1 application  topically as needed (breakout)., Disp: , Rfl:    magnesium oxide (MAG-OX) 400 (240 Mg) MG tablet, Take 400 mg by mouth daily., Disp: , Rfl:    metoprolol succinate (TOPROL-XL) 25 MG 24 hr tablet, Take 1 tablet (25 mg total) by mouth daily., Disp: 90 tablet, Rfl: 2   ticagrelor (BRILINTA) 90 MG TABS tablet, Take 1 tablet (90 mg total) by mouth 2 (two) times daily., Disp: 60 tablet, Rfl: 2   nitroGLYCERIN (NITROSTAT) 0.4 MG SL tablet, Place 0.4 mg under the tongue every 5 (five) minutes as needed for chest pain. (Patient not taking: Reported on 11/17/2022), Disp: , Rfl:   Past Medical History: Past Medical History:  Diagnosis Date   Allergy    Class 1 obesity due to excess calories with serious comorbidity and body mass index (BMI) of 33.0 to 33.9 in adult 07/08/2018    Diverticulosis    Fall 11/21/2018   Gout    Hematochezia 04/21/2016   History of COVID-19 02/05/2019   Hypertension    Lower GI bleed    Myocardial infarction (HCC) 10/01/22   Vitiligo     Tobacco Use: Social History   Tobacco Use  Smoking Status Never  Smokeless Tobacco Never    Labs: Review Flowsheet  More data exists      Latest Ref Rng & Units 05/12/2020 01/26/2021 05/12/2021 02/28/2022 08/29/2022  Labs for ITP Cardiac and Pulmonary Rehab  Cholestrol 100 - 199 mg/dL - - 322  025  427   LDL (calc) 0 - 99 mg/dL - - 062  376  283   HDL-C >39 mg/dL - - 30  31  27    Trlycerides 0 - 149 mg/dL - - 151  761  607   Hemoglobin A1c 4.8 - 5.6 % 5.6  5.6  - - 5.4     Details            Capillary Blood Glucose: No results found for: "GLUCAP"   Exercise Target Goals: Exercise Program Goal: Individual exercise prescription set using results from initial 6 min walk test and THRR while considering  patient's activity barriers and safety.   Exercise Prescription Goal: Initial exercise prescription builds to 30-45 minutes a day of aerobic activity, 2-3 days per week.  Home exercise guidelines will be given to patient during program as part of  exercise prescription that the participant will acknowledge.  Activity Barriers & Risk Stratification:  Activity Barriers & Cardiac Risk Stratification - 12/07/22 0823       Activity Barriers & Cardiac Risk Stratification   Activity Barriers Balance Concerns;Shortness of Breath;Joint Problems    Cardiac Risk Stratification High   <5 METs on            6 Minute Walk:  6 Minute Walk     Row Name 12/07/22 0957         6 Minute Walk   Phase Initial     Distance 1440 feet     Walk Time 6 minutes     # of Rest Breaks 0     MPH 2.73     METS 3.74     RPE 9     Perceived Dyspnea  0     VO2 Peak 13.07     Symptoms No     Resting HR 70 bpm     Resting BP 126/88     Resting Oxygen Saturation  98 %     Exercise Oxygen  Saturation  during 6 min walk 98 %     Max Ex. HR 89 bpm     Max Ex. BP 132/82     2 Minute Post BP 122/84              Oxygen Initial Assessment:   Oxygen Re-Evaluation:   Oxygen Discharge (Final Oxygen Re-Evaluation):   Initial Exercise Prescription:  Initial Exercise Prescription - 12/07/22 0900       Date of Initial Exercise RX and Referring Provider   Date 12/07/22    Referring Provider Percell Belt, MD    Expected Discharge Date 02/28/23      Recumbant Bike   Level 1    RPM 60    Watts 30    Minutes 15    METs 3      Recumbant Elliptical   Level 1    RPM 55    Watts 70    Minutes 15    METs 3      Prescription Details   Frequency (times per week) 3    Duration Progress to 30 minutes of continuous aerobic without signs/symptoms of physical distress      Intensity   THRR 40-80% of Max Heartrate 67-134    Ratings of Perceived Exertion 11-13    Perceived Dyspnea 0-4      Progression   Progression Continue progressive overload as per policy without signs/symptoms or physical distress.      Resistance Training   Training Prescription Yes    Weight 4    Reps 10-15             Perform Capillary Blood Glucose checks as needed.  Exercise Prescription Changes:   Exercise Comments:   Exercise Goals and Review:   Exercise Goals     Row Name 12/07/22 0804             Exercise Goals   Increase Physical Activity Yes       Intervention Provide advice, education, support and counseling about physical activity/exercise needs.;Develop an individualized exercise prescription for aerobic and resistive training based on initial evaluation findings, risk stratification, comorbidities and participant's personal goals.       Expected Outcomes Short Term: Attend rehab on a regular basis to increase amount of physical activity.;Long Term: Add in home exercise to make exercise part of routine and to increase  amount of physical activity.;Long  Term: Exercising regularly at least 3-5 days a week.       Increase Strength and Stamina Yes       Intervention Provide advice, education, support and counseling about physical activity/exercise needs.;Develop an individualized exercise prescription for aerobic and resistive training based on initial evaluation findings, risk stratification, comorbidities and participant's personal goals.       Expected Outcomes Short Term: Increase workloads from initial exercise prescription for resistance, speed, and METs.;Short Term: Perform resistance training exercises routinely during rehab and add in resistance training at home;Long Term: Improve cardiorespiratory fitness, muscular endurance and strength as measured by increased METs and functional capacity ( )       Able to understand and use rate of perceived exertion (RPE) scale Yes       Intervention Provide education and explanation on how to use RPE scale       Expected Outcomes Long Term:  Able to use RPE to guide intensity level when exercising independently;Short Term: Able to use RPE daily in rehab to express subjective intensity level       Knowledge and understanding of Target Heart Rate Range (THRR) Yes       Intervention Provide education and explanation of THRR including how the numbers were predicted and where they are located for reference       Expected Outcomes Short Term: Able to state/look up THRR;Short Term: Able to use daily as guideline for intensity in rehab;Long Term: Able to use THRR to govern intensity when exercising independently       Understanding of Exercise Prescription Yes       Intervention Provide education, explanation, and written materials on patient's individual exercise prescription       Expected Outcomes Short Term: Able to explain program exercise prescription;Long Term: Able to explain home exercise prescription to exercise independently                Exercise Goals Re-Evaluation :   Discharge Exercise  Prescription (Final Exercise Prescription Changes):   Nutrition:  Target Goals: Understanding of nutrition guidelines, daily intake of sodium 1500mg , cholesterol 200mg , calories 30% from fat and 7% or less from saturated fats, daily to have 5 or more servings of fruits and vegetables.  Biometrics:  Pre Biometrics - 12/07/22 0801       Pre Biometrics   Waist Circumference 48 inches    Hip Circumference 45.5 inches    Waist to Hip Ratio 1.05 %    Triceps Skinfold 36 mm    % Body Fat 36 %    Grip Strength 38 kg    Flexibility 13.5 in    Single Leg Stand 12.2 seconds              Nutrition Therapy Plan and Nutrition Goals:   Nutrition Assessments:  MEDIFICTS Score Key: >=70 Need to make dietary changes  40-70 Heart Healthy Diet <= 40 Therapeutic Level Cholesterol Diet    Picture Your Plate Scores: <36 Unhealthy dietary pattern with much room for improvement. 41-50 Dietary pattern unlikely to meet recommendations for good health and room for improvement. 51-60 More healthful dietary pattern, with some room for improvement.  >60 Healthy dietary pattern, although there may be some specific behaviors that could be improved.    Nutrition Goals Re-Evaluation:   Nutrition Goals Re-Evaluation:   Nutrition Goals Discharge (Final Nutrition Goals Re-Evaluation):   Psychosocial: Target Goals: Acknowledge presence or absence of significant depression and/or stress, maximize coping skills, provide  positive support system. Participant is able to verbalize types and ability to use techniques and skills needed for reducing stress and depression.  Initial Review & Psychosocial Screening:  Initial Psych Review & Screening - 12/07/22 0823       Initial Review   Current issues with Current Depression;Current Stress Concerns    Source of Stress Concerns Chronic Illness;Family;Financial;Occupation;Unable to participate in former interests or hobbies    Comments Verner shared that  he has had some feelings of anxiety/depression/stress since his MI. He says he has had to make a lot of changes which has been overwhelming at times, and overall just feels fatigue. He denies any need for additional resources at this time.      Family Dynamics   Good Support System? Yes   Jeramiha has his wife for support     Barriers   Psychosocial barriers to participate in program The patient should benefit from training in stress management and relaxation.      Screening Interventions   Interventions To provide support and resources with identified psychosocial needs;Provide feedback about the scores to participant;Encouraged to exercise    Expected Outcomes Long Term Goal: Stressors or current issues are controlled or eliminated.;Short Term goal: Identification and review with participant of any Quality of Life or Depression concerns found by scoring the questionnaire.;Long Term goal: The participant improves quality of Life and PHQ9 Scores as seen by post scores and/or verbalization of changes;Short Term goal: Utilizing psychosocial counselor, staff and physician to assist with identification of specific Stressors or current issues interfering with healing process. Setting desired goal for each stressor or current issue identified.             Quality of Life Scores:  Quality of Life - 12/07/22 0846       Quality of Life   Select Quality of Life      Quality of Life Scores   Health/Function Pre 15.43 %    Socioeconomic Pre 18.69 %    Psych/Spiritual Pre 22.36 %    Family Pre 21.1 %    GLOBAL Pre 18.37 %            Scores of 19 and below usually indicate a poorer quality of life in these areas.  A difference of  2-3 points is a clinically meaningful difference.  A difference of 2-3 points in the total score of the Quality of Life Index has been associated with significant improvement in overall quality of life, self-image, physical symptoms, and general health in studies  assessing change in quality of life.  PHQ-9: Review Flowsheet  More data exists      12/07/2022 11/09/2022 05/01/2022 09/15/2021 05/12/2021  Depression screen PHQ 2/9  Decreased Interest 1 3 0 0 0  Down, Depressed, Hopeless 1 1 0 0 0  PHQ - 2 Score 2 4 0 0 0  Altered sleeping 2 2 0 - -  Tired, decreased energy 2 3 0 - -  Change in appetite 0 0 0 - -  Feeling bad or failure about yourself  0 1 0 - -  Trouble concentrating 0 2 0 - -  Moving slowly or fidgety/restless 0 2 0 - -  Suicidal thoughts 0 0 0 - -  PHQ-9 Score 6 14 0 - -  Difficult doing work/chores Somewhat difficult Extremely dIfficult Not difficult at all - -    Details           Interpretation of Total Score  Total Score Depression  Severity:  1-4 = Minimal depression, 5-9 = Mild depression, 10-14 = Moderate depression, 15-19 = Moderately severe depression, 20-27 = Severe depression   Psychosocial Evaluation and Intervention:   Psychosocial Re-Evaluation:   Psychosocial Discharge (Final Psychosocial Re-Evaluation):   Vocational Rehabilitation: Provide vocational rehab assistance to qualifying candidates.   Vocational Rehab Evaluation & Intervention:  Vocational Rehab - 12/07/22 1610       Initial Vocational Rehab Evaluation & Intervention   Assessment shows need for Vocational Rehabilitation No   Dandrea is hoping to return to work soon            Education: Education Goals: Education classes will be provided on a weekly basis, covering required topics. Participant will state understanding/return demonstration of topics presented.     Core Videos: Exercise    Move It!  Clinical staff conducted group or individual video education with verbal and written material and guidebook.  Patient learns the recommended Pritikin exercise program. Exercise with the goal of living a long, healthy life. Some of the health benefits of exercise include controlled diabetes, healthier blood pressure levels, improved  cholesterol levels, improved heart and lung capacity, improved sleep, and better body composition. Everyone should speak with their doctor before starting or changing an exercise routine.  Biomechanical Limitations Clinical staff conducted group or individual video education with verbal and written material and guidebook.  Patient learns how biomechanical limitations can impact exercise and how we can mitigate and possibly overcome limitations to have an impactful and balanced exercise routine.  Body Composition Clinical staff conducted group or individual video education with verbal and written material and guidebook.  Patient learns that body composition (ratio of muscle mass to fat mass) is a key component to assessing overall fitness, rather than body weight alone. Increased fat mass, especially visceral belly fat, can put [place] at increased risk for metabolic syndrome, type 2 diabetes, heart disease, and even death. It is recommended to combine diet and exercise (cardiovascular and resistance training) to improve your body composition. Seek guidance from your physician and exercise physiologist before implementing an exercise routine.  Exercise Action Plan Clinical staff conducted group or individual video education with verbal and written material and guidebook.  Patient learns the recommended strategies to achieve and enjoy long-term exercise adherence, including variety, self-motivation, self-efficacy, and positive decision making. Benefits of exercise include fitness, good health, weight management, more energy, better sleep, less stress, and overall well-being.  Medical   Heart Disease Risk Reduction Clinical staff conducted group or individual video education with verbal and written material and guidebook.  Patient learns our heart is our most vital organ as it circulates oxygen, nutrients, white blood cells, and hormones throughout the entire body, and carries waste away. Data supports a  plant-based eating plan like the Pritikin Program for its effectiveness in slowing progression of and reversing heart disease. The video provides a number of recommendations to address heart disease.   Metabolic Syndrome and Belly Fat  Clinical staff conducted group or individual video education with verbal and written material and guidebook.  Patient learns what metabolic syndrome is, how it leads to heart disease, and how one can reverse it and keep it from coming back. You have metabolic syndrome if you have 3 of the following 5 criteria: abdominal obesity, high blood pressure, high triglycerides, low HDL cholesterol, and high blood sugar.  Hypertension and Heart Disease Clinical staff conducted group or individual video education with verbal and written material and guidebook.  Patient learns that  high blood pressure, or hypertension, is very common in the [place]. Hypertension is largely due to excessive salt intake, but other important risk factors include being overweight, physical inactivity, drinking too much alcohol, smoking, and not eating enough potassium from fruits and vegetables. High blood pressure is a leading risk factor for heart attack, stroke, congestive heart failure, dementia, kidney failure, and premature death. Long-term effects of excessive salt intake include stiffening of the arteries and thickening of heart muscle and organ damage. Recommendations include ways to reduce hypertension and the risk of heart disease.  Diseases of Our Time - Focusing on Diabetes Clinical staff conducted group or individual video education with verbal and written material and guidebook.  Patient learns why the best way to stop diseases of our time is prevention, through food and other lifestyle changes. Medicine (such as prescription pills and surgeries) is often only a Band-Aid on the problem, not a long-term solution. Most common diseases of our time include obesity, type 2 diabetes,  hypertension, heart disease, and cancer. The Pritikin Program is recommended and has been proven to help reduce, reverse, and/or prevent the damaging effects of metabolic syndrome.  Nutrition   Overview of the Pritikin Eating Plan  Clinical staff conducted group or individual video education with verbal and written material and guidebook.  Patient learns about the Pritikin Eating Plan for disease risk reduction. The Pritikin Eating Plan emphasizes a wide variety of unrefined, minimally-processed carbohydrates, like fruits, vegetables, whole grains, and legumes. Go, Caution, and Stop food choices are explained. Plant-based and lean animal proteins are emphasized. Rationale provided for low sodium intake for blood pressure control, low added sugars for blood sugar stabilization, and low added fats and oils for coronary artery disease risk reduction and weight management.  Calorie Density  Clinical staff conducted group or individual video education with verbal and written material and guidebook.  Patient learns about calorie density and how it impacts the Pritikin Eating Plan. Knowing the characteristics of the food you choose will help you decide whether those foods will lead to weight gain or weight loss, and whether you want to consume more or less of them. Weight loss is usually a side effect of the Pritikin Eating Plan because of its focus on low calorie-dense foods.  Label Reading  Clinical staff conducted group or individual video education with verbal and written material and guidebook.  Patient learns about the Pritikin recommended label reading guidelines and corresponding recommendations regarding calorie density, added sugars, sodium content, and whole grains.  Dining Out - Part 1  Clinical staff conducted group or individual video education with verbal and written material and guidebook.  Patient learns that restaurant meals can be sabotaging because they can be so high in calories, fat,  sodium, and/or sugar. Patient learns recommended strategies on how to positively address this and avoid unhealthy pitfalls.  Facts on Fats  Clinical staff conducted group or individual video education with verbal and written material and guidebook.  Patient learns that lifestyle modifications can be just as effective, if not more so, as many medications for lowering your risk of heart disease. A Pritikin lifestyle can help to reduce your risk of inflammation and atherosclerosis (cholesterol build-up, or plaque, in the artery walls). Lifestyle interventions such as dietary choices and physical activity address the cause of atherosclerosis. A review of the types of fats and their impact on blood cholesterol levels, along with dietary recommendations to reduce fat intake is also included.  Nutrition Action Plan  Clinical  staff conducted group or individual video education with verbal and written material and guidebook.  Patient learns how to incorporate Pritikin recommendations into their lifestyle. Recommendations include planning and keeping personal health goals in mind as an important part of their success.  Healthy Mind-Set    Healthy Minds, Bodies, Hearts  Clinical staff conducted group or individual video education with verbal and written material and guidebook.  Patient learns how to identify when they are stressed. Video will discuss the impact of that stress, as well as the many benefits of stress management. Patient will also be introduced to stress management techniques. The way we think, act, and feel has an impact on our hearts.  How Our Thoughts Can Heal Our Hearts  Clinical staff conducted group or individual video education with verbal and written material and guidebook.  Patient learns that negative thoughts can cause depression and anxiety. This can result in negative lifestyle behavior and serious health problems. Cognitive behavioral therapy is an effective method to help control  our thoughts in order to change and improve our emotional outlook.  Additional Videos:  Exercise    Improving Performance  Clinical staff conducted group or individual video education with verbal and written material and guidebook.  Patient learns to use a non-linear approach by alternating intensity levels and lengths of time spent exercising to help burn more calories and lose more body fat. Cardiovascular exercise helps improve heart health, metabolism, hormonal balance, blood sugar control, and recovery from fatigue. Resistance training improves strength, endurance, balance, coordination, reaction time, metabolism, and muscle mass. Flexibility exercise improves circulation, posture, and balance. Seek guidance from your physician and exercise physiologist before implementing an exercise routine and learn your capabilities and proper form for all exercise.  Introduction to Yoga  Clinical staff conducted group or individual video education with verbal and written material and guidebook.  Patient learns about yoga, a discipline of the coming together of mind, breath, and body. The benefits of yoga include improved flexibility, improved range of motion, better posture and core strength, increased lung function, weight loss, and positive self-image. Yoga's heart health benefits include lowered blood pressure, healthier heart rate, decreased cholesterol and triglyceride levels, improved immune function, and reduced stress. Seek guidance from your physician and exercise physiologist before implementing an exercise routine and learn your capabilities and proper form for all exercise.  Medical   Aging: Enhancing Your Quality of Life  Clinical staff conducted group or individual video education with verbal and written material and guidebook.  Patient learns key strategies and recommendations to stay in good physical health and enhance quality of life, such as prevention strategies, having an advocate,  securing a Health Care Proxy and Power of Attorney, and keeping a list of medications and system for tracking them. It also discusses how to avoid risk for bone loss.  Biology of Weight Control  Clinical staff conducted group or individual video education with verbal and written material and guidebook.  Patient learns that weight gain occurs because we consume more calories than we burn (eating more, moving less). Even if your body weight is normal, you may have higher ratios of fat compared to muscle mass. Too much body fat puts you at increased risk for cardiovascular disease, heart attack, stroke, type 2 diabetes, and obesity-related cancers. In addition to exercise, following the Pritikin Eating Plan can help reduce your risk.  Decoding Lab Results  Clinical staff conducted group or individual video education with verbal and written material and guidebook.  Patient  learns that lab test reflects one measurement whose values change over time and are influenced by many factors, including medication, stress, sleep, exercise, food, hydration, pre-existing medical conditions, and more. It is recommended to use the knowledge from this video to become more involved with your lab results and evaluate your numbers to speak with your doctor.   Diseases of Our Time - Overview  Clinical staff conducted group or individual video education with verbal and written material and guidebook.  Patient learns that according to the CDC, 50% to 70% of chronic diseases (such as obesity, type 2 diabetes, elevated lipids, hypertension, and heart disease) are avoidable through lifestyle improvements including healthier food choices, listening to satiety cues, and increased physical activity.  Sleep Disorders Clinical staff conducted group or individual video education with verbal and written material and guidebook.  Patient learns how good quality and duration of sleep are important to overall health and well-being.  Patient also learns about sleep disorders and how they impact health along with recommendations to address them, including discussing with a physician.  Nutrition  Dining Out - Part 2 Clinical staff conducted group or individual video education with verbal and written material and guidebook.  Patient learns how to plan ahead and communicate in order to maximize their dining experience in a healthy and nutritious manner. Included are recommended food choices based on the type of restaurant the patient is visiting.   Fueling a Banker conducted group or individual video education with verbal and written material and guidebook.  There is a strong connection between our food choices and our health. Diseases like obesity and type 2 diabetes are very prevalent and are in large-part due to lifestyle choices. The Pritikin Eating Plan provides plenty of food and hunger-curbing satisfaction. It is easy to follow, affordable, and helps reduce health risks.  Menu Workshop  Clinical staff conducted group or individual video education with verbal and written material and guidebook.  Patient learns that restaurant meals can sabotage health goals because they are often packed with calories, fat, sodium, and sugar. Recommendations include strategies to plan ahead and to communicate with the manager, chef, or server to help order a healthier meal.  Planning Your Eating Strategy  Clinical staff conducted group or individual video education with verbal and written material and guidebook.  Patient learns about the Pritikin Eating Plan and its benefit of reducing the risk of disease. The Pritikin Eating Plan does not focus on calories. Instead, it emphasizes high-quality, nutrient-rich foods. By knowing the characteristics of the foods, we choose, we can determine their calorie density and make informed decisions.  Targeting Your Nutrition Priorities  Clinical staff conducted group or individual  video education with verbal and written material and guidebook.  Patient learns that lifestyle habits have a tremendous impact on disease risk and progression. This video provides eating and physical activity recommendations based on your personal health goals, such as reducing LDL cholesterol, losing weight, preventing or controlling type 2 diabetes, and reducing high blood pressure.  Vitamins and Minerals  Clinical staff conducted group or individual video education with verbal and written material and guidebook.  Patient learns different ways to obtain key vitamins and minerals, including through a recommended healthy diet. It is important to discuss all supplements you take with your doctor.   Healthy Mind-Set    Smoking Cessation  Clinical staff conducted group or individual video education with verbal and written material and guidebook.  Patient learns that cigarette smoking and  tobacco addiction pose a serious health risk which affects millions of people. Stopping smoking will significantly reduce the risk of heart disease, lung disease, and many forms of cancer. Recommended strategies for quitting are covered, including working with your doctor to develop a successful plan.  Culinary   Becoming a Set designer conducted group or individual video education with verbal and written material and guidebook.  Patient learns that cooking at home can be healthy, cost-effective, quick, and puts them in control. Keys to cooking healthy recipes will include looking at your recipe, assessing your equipment needs, planning ahead, making it simple, choosing cost-effective seasonal ingredients, and limiting the use of added fats, salts, and sugars.  Cooking - Breakfast and Snacks  Clinical staff conducted group or individual video education with verbal and written material and guidebook.  Patient learns how important breakfast is to satiety and nutrition through the entire day.  Recommendations include key foods to eat during breakfast to help stabilize blood sugar levels and to prevent overeating at meals later in the day. Planning ahead is also a key component.  Cooking - Educational psychologist conducted group or individual video education with verbal and written material and guidebook.  Patient learns eating strategies to improve overall health, including an approach to cook more at home. Recommendations include thinking of animal protein as a side on your plate rather than center stage and focusing instead on lower calorie dense options like vegetables, fruits, whole grains, and plant-based proteins, such as beans. Making sauces in large quantities to freeze for later and leaving the skin on your vegetables are also recommended to maximize your experience.  Cooking - Healthy Salads and Dressing Clinical staff conducted group or individual video education with verbal and written material and guidebook.  Patient learns that vegetables, fruits, whole grains, and legumes are the foundations of the Pritikin Eating Plan. Recommendations include how to incorporate each of these in flavorful and healthy salads, and how to create homemade salad dressings. Proper handling of ingredients is also covered. Cooking - Soups and [employer] - Soups and Desserts Clinical staff conducted group or individual video education with verbal and written material and guidebook.  Patient learns that Pritikin soups and desserts make for easy, nutritious, and delicious snacks and meal components that are low in sodium, fat, sugar, and calorie density, while high in vitamins, minerals, and filling fiber. Recommendations include simple and healthy ideas for soups and desserts.   Overview     The Pritikin Solution Program Overview Clinical staff conducted group or individual video education with verbal and written material and guidebook.  Patient learns that the results of the Pritikin  Program have been documented in more than 100 articles published in peer-reviewed journals, and the benefits include reducing risk factors for (and, in some cases, even reversing) high cholesterol, high blood pressure, type 2 diabetes, obesity, and more! An overview of the three key pillars of the Pritikin Program will be covered: eating well, doing regular exercise, and having a healthy mind-set.  WORKSHOPS  Exercise: Exercise Basics: Building Your Action Plan Clinical staff led group instruction and group discussion with PowerPoint presentation and patient guidebook. To enhance the learning environment the use of posters, models and videos may be added. At the conclusion of this workshop, patients will comprehend the difference between physical activity and exercise, as well as the benefits of incorporating both, into their routine. Patients will understand the FITT (Frequency, Intensity, Time, and Type)  principle and how to use it to build an exercise action plan. In addition, safety concerns and other considerations for exercise and cardiac rehab will be addressed by the presenter. The purpose of this lesson is to promote a comprehensive and effective weekly exercise routine in order to improve patients' overall level of fitness.   Managing Heart Disease: Your Path to a Healthier Heart Clinical staff led group instruction and group discussion with PowerPoint presentation and patient guidebook. To enhance the learning environment the use of posters, models and videos may be added.At the conclusion of this workshop, patients will understand the anatomy and physiology of the heart. Additionally, they will understand how Pritikin's three pillars impact the risk factors, the progression, and the management of heart disease.  The purpose of this lesson is to provide a high-level overview of the heart, heart disease, and how the Pritikin lifestyle positively impacts risk factors.  Exercise  Biomechanics Clinical staff led group instruction and group discussion with PowerPoint presentation and patient guidebook. To enhance the learning environment the use of posters, models and videos may be added. Patients will learn how the structural parts of their bodies function and how these functions impact their daily activities, movement, and exercise. Patients will learn how to promote a neutral spine, learn how to manage pain, and identify ways to improve their physical movement in order to promote healthy living. The purpose of this lesson is to expose patients to common physical limitations that impact physical activity. Participants will learn practical ways to adapt and manage aches and pains, and to minimize their effect on regular exercise. Patients will learn how to maintain good posture while sitting, walking, and lifting.  Balance Training and Fall Prevention  Clinical staff led group instruction and group discussion with PowerPoint presentation and patient guidebook. To enhance the learning environment the use of posters, models and videos may be added. At the conclusion of this workshop, patients will understand the importance of their sensorimotor skills (vision, proprioception, and the vestibular system) in maintaining their ability to balance as they age. Patients will apply a variety of balancing exercises that are appropriate for their current level of function. Patients will understand the common causes for poor balance, possible solutions to these problems, and ways to modify their physical environment in order to minimize their fall risk. The purpose of this lesson is to teach patients about the importance of maintaining balance as they age and ways to minimize their risk of falling.  WORKSHOPS   Nutrition:  Fueling a Ship broker led group instruction and group discussion with PowerPoint presentation and patient guidebook. To enhance the learning  environment the use of posters, models and videos may be added. Patients will review the foundational principles of the Pritikin Eating Plan and understand what constitutes a serving size in each of the food groups. Patients will also learn Pritikin-friendly foods that are better choices when away from home and review make-ahead meal and snack options. Calorie density will be reviewed and applied to three nutrition priorities: weight maintenance, weight loss, and weight gain. The purpose of this lesson is to reinforce (in a group setting) the key concepts around what patients are recommended to eat and how to apply these guidelines when away from home by planning and selecting Pritikin-friendly options. Patients will understand how calorie density may be adjusted for different weight management goals.  Mindful Eating  Clinical staff led group instruction and group discussion with PowerPoint presentation and patient guidebook. To enhance  the learning environment the use of posters, models and videos may be added. Patients will briefly review the concepts of the Pritikin Eating Plan and the importance of low-calorie dense foods. The concept of mindful eating will be introduced as well as the importance of paying attention to internal hunger signals. Triggers for non-hunger eating and techniques for dealing with triggers will be explored. The purpose of this lesson is to provide patients with the opportunity to review the basic principles of the Pritikin Eating Plan, discuss the value of eating mindfully and how to measure internal cues of hunger and fullness using the Hunger Scale. Patients will also discuss reasons for non-hunger eating and learn strategies to use for controlling emotional eating.  Targeting Your Nutrition Priorities Clinical staff led group instruction and group discussion with PowerPoint presentation and patient guidebook. To enhance the learning environment the use of posters, models and  videos may be added. Patients will learn how to determine their genetic susceptibility to disease by reviewing their family history. Patients will gain insight into the importance of diet as part of an overall healthy lifestyle in mitigating the impact of genetics and other environmental insults. The purpose of this lesson is to provide patients with the opportunity to assess their personal nutrition priorities by looking at their family history, their own health history and current risk factors. Patients will also be able to discuss ways of prioritizing and modifying the Pritikin Eating Plan for their highest risk areas  Menu  Clinical staff led group instruction and group discussion with PowerPoint presentation and patient guidebook. To enhance the learning environment the use of posters, models and videos may be added. Using menus brought in from [employer], or printed from [employer], patients will apply the Pritikin dining out guidelines that were presented in the [employer] video. Patients will also be able to practice these guidelines in a variety of provided scenarios. The purpose of this lesson is to provide patients with the opportunity to practice hands-on learning of the Pritikin Dining Out guidelines with actual menus and practice scenarios.  Label Reading Clinical staff led group instruction and group discussion with PowerPoint presentation and patient guidebook. To enhance the learning environment the use of posters, models and videos may be added. Patients will review and discuss the Pritikin label reading guidelines presented in Pritikin's Label Reading Educational series video. Using fool labels brought in from local grocery stores and markets, patients will apply the label reading guidelines and determine if the packaged food meet the Pritikin guidelines. The purpose of this lesson is to provide patients with the opportunity to review, discuss, and practice  hands-on learning of the Pritikin Label Reading guidelines with actual packaged food labels. Cooking School  Pritikin's [employer] are designed to teach patients ways to prepare quick, simple, and affordable recipes at home. The importance of nutrition's role in chronic disease risk reduction is reflected in its emphasis in the overall Pritikin program. By learning how to prepare essential core Pritikin Eating Plan recipes, patients will increase control over what they eat; be able to customize the flavor of foods without the use of added salt, sugar, or fat; and improve the quality of the food they consume. By learning a set of core recipes which are easily assembled, quickly prepared, and affordable, patients are more likely to prepare more healthy foods at home. These workshops focus on convenient breakfasts, simple entres, side dishes, and desserts which can be prepared with minimal effort and  are consistent with nutrition recommendations for cardiovascular risk reduction. Cooking [employer] are taught by a Armed forces logistics/support/administrative officer (RD) who has been trained by the [employer]. The chef or RD has a clear understanding of the importance of minimizing - if not completely eliminating - added fat, sugar, and sodium in recipes. Throughout the series of Cooking School Workshop sessions, patients will learn about healthy ingredients and efficient methods of cooking to build confidence in their capability to prepare    Cooking School weekly topics:  Adding Flavor- Sodium-Free  Fast and Healthy Breakfasts  Powerhouse Plant-Based Proteins  Satisfying Salads and Dressings  Simple Sides and Sauces  International Cuisine-Spotlight on the [employer] Zones  Delicious Desserts  Savory Soups  [employer] - Meals in a Astronomer Appetizers and Snacks  Comforting Weekend Breakfasts  One-Pot Wonders   Fast Evening Meals  Landscape architect Your Pritikin  Plate  WORKSHOPS   Healthy Mindset (Psychosocial):  Focused Goals, Sustainable Changes Clinical staff led group instruction and group discussion with PowerPoint presentation and patient guidebook. To enhance the learning environment the use of posters, models and videos may be added. Patients will be able to apply effective goal setting strategies to establish at least one personal goal, and then take consistent, meaningful action toward that goal. They will learn to identify common barriers to achieving personal goals and develop strategies to overcome them. Patients will also gain an understanding of how our mind-set can impact our ability to achieve goals and the importance of cultivating a positive and growth-oriented mind-set. The purpose of this lesson is to provide patients with a deeper understanding of how to set and achieve personal goals, as well as the tools and strategies needed to overcome common obstacles which may arise along the way.  From Head to Heart: The Power of a Healthy Outlook  Clinical staff led group instruction and group discussion with PowerPoint presentation and patient guidebook. To enhance the learning environment the use of posters, models and videos may be added. Patients will be able to recognize and describe the impact of emotions and mood on physical health. They will discover the importance of self-care and explore self-care practices which may work for them. Patients will also learn how to utilize the 4 C's to cultivate a healthier outlook and better manage stress and challenges. The purpose of this lesson is to demonstrate to patients how a healthy outlook is an essential part of maintaining good health, especially as they continue their cardiac rehab journey.  Healthy Sleep for a Healthy Heart Clinical staff led group instruction and group discussion with PowerPoint presentation and patient guidebook. To enhance the learning environment the use of posters,  models and videos may be added. At the conclusion of this workshop, patients will be able to demonstrate knowledge of the importance of sleep to overall health, well-being, and quality of life. They will understand the symptoms of, and treatments for, common sleep disorders. Patients will also be able to identify daytime and nighttime behaviors which impact sleep, and they will be able to apply these tools to help manage sleep-related challenges. The purpose of this lesson is to provide patients with a general overview of sleep and outline the importance of quality sleep. Patients will learn about a few of the most common sleep disorders. Patients will also be introduced to the concept of "sleep hygiene," and discover ways to self-manage certain sleeping problems through simple daily behavior changes. Finally, the workshop  will motivate patients by clarifying the links between quality sleep and their goals of heart-healthy living.   Recognizing and Reducing Stress Clinical staff led group instruction and group discussion with PowerPoint presentation and patient guidebook. To enhance the learning environment the use of posters, models and videos may be added. At the conclusion of this workshop, patients will be able to understand the types of stress reactions, differentiate between acute and chronic stress, and recognize the impact that chronic stress has on their health. They will also be able to apply different coping mechanisms, such as reframing negative self-talk. Patients will have the opportunity to practice a variety of stress management techniques, such as deep abdominal breathing, progressive muscle relaxation, and/or guided imagery.  The purpose of this lesson is to educate patients on the role of stress in their lives and to provide healthy techniques for coping with it.  Learning Barriers/Preferences:  Learning Barriers/Preferences - 12/07/22 0825       Learning Barriers/Preferences   Learning  Barriers Sight   wears glasses   Learning Preferences Audio;Computer/Internet;Group Instruction;Individual Instruction;Skilled Demonstration;Verbal Instruction;Video             Education Topics:  Knowledge Questionnaire Score:  Knowledge Questionnaire Score - 12/07/22 0838       Knowledge Questionnaire Score   Pre Score 21/24             Core Components/Risk Factors/Patient Goals at Admission:  Personal Goals and Risk Factors at Admission - 12/07/22 0826       Core Components/Risk Factors/Patient Goals on Admission    Weight Management Yes;Obesity;Weight Loss    Intervention Weight Management: Develop a combined nutrition and exercise program designed to reach desired caloric intake, while maintaining appropriate intake of nutrient and fiber, sodium and fats, and appropriate energy expenditure required for the weight goal.;Weight Management: Provide education and appropriate resources to help participant work on and attain dietary goals.;Weight Management/Obesity: Establish reasonable short term and long term weight goals.;Obesity: Provide education and appropriate resources to help participant work on and attain dietary goals.    Expected Outcomes Short Term: Continue to assess and modify interventions until short term weight is achieved;Long Term: Adherence to nutrition and physical activity/exercise program aimed toward attainment of established weight goal;Weight Loss: Understanding of general recommendations for a balanced deficit meal plan, which promotes 1-2 lb weight loss per week and includes a negative energy balance of [id number] kcal/d;Understanding recommendations for meals to include 15-35% energy as protein, 25-35% energy from fat, 35-60% energy from carbohydrates, less than 200mg  of dietary cholesterol, 20-35 gm of total fiber daily;Understanding of distribution of calorie intake throughout the day with the consumption of 4-5 meals/snacks    Hypertension Yes     Intervention Provide education on lifestyle modifcations including regular physical activity/exercise, weight management, moderate sodium restriction and increased consumption of fresh fruit, vegetables, and low fat dairy, alcohol moderation, and smoking cessation.;Monitor prescription use compliance.    Expected Outcomes Short Term: Continued assessment and intervention until BP is < 140/81mm HG in hypertensive participants. < 130/73mm HG in hypertensive participants with diabetes, heart failure or chronic kidney disease.;Long Term: Maintenance of blood pressure at goal levels.    Lipids Yes    Intervention Provide education and support for participant on nutrition & aerobic/resistive exercise along with prescribed medications to achieve LDL 70mg , HDL >40mg .    Expected Outcomes Short Term: Participant states understanding of desired cholesterol values and is compliant with medications prescribed. Participant is following exercise prescription and nutrition guidelines.;Long  Term: Cholesterol controlled with medications as prescribed, with individualized exercise RX and with personalized nutrition plan. Value goals: LDL < 70mg , HDL > 40 mg.    Stress Yes    Intervention Refer participants experiencing significant psychosocial distress to appropriate mental health specialists for further evaluation and treatment. When possible, include family members and significant others in education/counseling sessions.;Offer individual and/or small group education and counseling on adjustment to heart disease, stress management and health-related lifestyle change. Teach and support self-help strategies.    Expected Outcomes Short Term: Participant demonstrates changes in health-related behavior, relaxation and other stress management skills, ability to obtain effective social support, and compliance with psychotropic medications if prescribed.;Long Term: Emotional wellbeing is indicated by absence of clinically  significant psychosocial distress or social isolation.             Core Components/Risk Factors/Patient Goals Review:    Core Components/Risk Factors/Patient Goals at Discharge (Final Review):    ITP Comments:  ITP Comments     Row Name 12/07/22 0802           ITP Comments Armanda Magic, MD: Medical Director.  Introduction to the [facility]/Intensive Cardiac Rehab. Initial orientation packet reviewed with the patient.                Comments: Participant attended orientation for the cardiac rehabilitation program on  12/07/2022  to perform initial intake and exercise walk test. Patient introduced to the [facility] education and orientation packet was reviewed. Completed 6-minute walk test, measurements, initial ITP, and exercise prescription. Vital signs stable. Telemetry-normal sinus rhythm PVCs, asymptomatic.   Service time was from [phone] to [phone].     [name], MS, ACSM-CEP 12/07/2022 10:01 AM

## 2022-12-11 ENCOUNTER — Encounter: Payer: Self-pay | Admitting: Nurse Practitioner

## 2022-12-11 ENCOUNTER — Encounter (HOSPITAL_COMMUNITY)
Admission: RE | Admit: 2022-12-11 | Discharge: 2022-12-11 | Disposition: A | Discharge status: Home / Self Care | Payer: No Typology Code available for payment source | Source: Ambulatory Visit | Attending: Cardiology | Admitting: Cardiology

## 2022-12-11 DIAGNOSIS — I213 ST elevation (STEMI) myocardial infarction of unspecified site: Secondary | ICD-10-CM | POA: Diagnosis not present

## 2022-12-11 NOTE — Progress Notes (Signed)
Cardiac Individual Treatment Plan  Patient Details  Name: Chase Walsh MRN: [id number] Date of Birth: 1970-03-01 Referring Provider:   Flowsheet Row CARDIAC REHAB PHASE II ORIENTATION from 12/07/2022 in [facility]  Referring Provider Percell Belt, MD       Initial Encounter Date:  Flowsheet Row CARDIAC REHAB PHASE II ORIENTATION from 12/07/2022 in [facility]  Date 12/07/22       Visit Diagnosis: 10/01/22 ST elevation myocardial infarction (STEMI)  Patient's Home Medications on Admission:  Current Outpatient Medications:    Ascorbic Acid (VITAMIN C) 1000 MG tablet, Take 1,000 mg by mouth every other day., Disp: , Rfl:    aspirin 81 MG chewable tablet, Chew 1 tablet (81 mg total) by mouth daily., Disp: 90 tablet, Rfl: 2   atorvastatin (LIPITOR) 80 MG tablet, Take 1 tablet (80 mg total) by mouth daily., Disp: 90 tablet, Rfl: 2   dapagliflozin propanediol (FARXIGA) 10 MG TABS tablet, Take by mouth daily., Disp: , Rfl:    fluocinonide (LIDEX) 0.05 % external solution, Apply 1 application  topically as needed (breakout)., Disp: , Rfl:    magnesium oxide (MAG-OX) 400 (240 Mg) MG tablet, Take 400 mg by mouth daily., Disp: , Rfl:    metoprolol succinate (TOPROL-XL) 25 MG 24 hr tablet, Take 1 tablet (25 mg total) by mouth daily., Disp: 90 tablet, Rfl: 2   nitroGLYCERIN (NITROSTAT) 0.4 MG SL tablet, Place 0.4 mg under the tongue every 5 (five) minutes as needed for chest pain. (Patient not taking: Reported on 11/17/2022), Disp: , Rfl:    ticagrelor (BRILINTA) 90 MG TABS tablet, Take 1 tablet (90 mg total) by mouth 2 (two) times daily., Disp: 60 tablet, Rfl: 2  Past Medical History: Past Medical History:  Diagnosis Date   Allergy    Class 1 obesity due to excess calories with serious comorbidity and body mass index (BMI) of 33.0 to 33.9 in adult 07/08/2018    Diverticulosis    Fall 11/21/2018   Gout    Hematochezia 04/21/2016   History of COVID-19 02/05/2019   Hypertension    Lower GI bleed    Myocardial infarction (HCC) 10/01/22   Vitiligo     Tobacco Use: Social History   Tobacco Use  Smoking Status Never  Smokeless Tobacco Never    Labs: Review Flowsheet  More data exists      Latest Ref Rng & Units 05/12/2020 01/26/2021 05/12/2021 02/28/2022 08/29/2022  Labs for ITP Cardiac and Pulmonary Rehab  Cholestrol 100 - 199 mg/dL - - 409  811  914   LDL (calc) 0 - 99 mg/dL - - 782  956  213   HDL-C >39 mg/dL - - 30  31  27    Trlycerides 0 - 149 mg/dL - - 086  578  469   Hemoglobin A1c 4.8 - 5.6 % 5.6  5.6  - - 5.4     Details            Capillary Blood Glucose: No results found for: "GLUCAP"   Exercise Target Goals: Exercise Program Goal: Individual exercise prescription set using results from initial 6 min walk test and THRR while considering  patient's activity barriers and safety.   Exercise Prescription Goal: Initial exercise prescription builds to 30-45 minutes a day of aerobic activity, 2-3 days per week.  Home exercise guidelines will be given to patient during program as part of  exercise prescription that the participant will acknowledge.  Activity Barriers & Risk Stratification:  Activity Barriers & Cardiac Risk Stratification - 12/07/22 0823       Activity Barriers & Cardiac Risk Stratification   Activity Barriers Balance Concerns;Shortness of Breath;Joint Problems    Cardiac Risk Stratification High   <5 METs on            6 Minute Walk:  6 Minute Walk     Row Name 12/07/22 0957         6 Minute Walk   Phase Initial     Distance 1440 feet     Walk Time 6 minutes     # of Rest Breaks 0     MPH 2.73     METS 3.74     RPE 9     Perceived Dyspnea  0     VO2 Peak 13.07     Symptoms No     Resting HR 70 bpm     Resting BP 126/88     Resting Oxygen Saturation  98 %     Exercise Oxygen  Saturation  during 6 min walk 98 %     Max Ex. HR 89 bpm     Max Ex. BP 132/82     2 Minute Post BP 122/84              Oxygen Initial Assessment:   Oxygen Re-Evaluation:   Oxygen Discharge (Final Oxygen Re-Evaluation):   Initial Exercise Prescription:  Initial Exercise Prescription - 12/07/22 0900       Date of Initial Exercise RX and Referring Provider   Date 12/07/22    Referring Provider Percell Belt, MD    Expected Discharge Date 02/28/23      Recumbant Bike   Level 1    RPM 60    Watts 30    Minutes 15    METs 3      Recumbant Elliptical   Level 1    RPM 55    Watts 70    Minutes 15    METs 3      Prescription Details   Frequency (times per week) 3    Duration Progress to 30 minutes of continuous aerobic without signs/symptoms of physical distress      Intensity   THRR 40-80% of Max Heartrate 67-134    Ratings of Perceived Exertion 11-13    Perceived Dyspnea 0-4      Progression   Progression Continue progressive overload as per policy without signs/symptoms or physical distress.      Resistance Training   Training Prescription Yes    Weight 4    Reps 10-15             Perform Capillary Blood Glucose checks as needed.  Exercise Prescription Changes:   Exercise Prescription Changes     Row Name 12/11/22 [phone]             Response to Exercise   Blood Pressure (Admit) 140/88       Blood Pressure (Exercise) 140/84       Blood Pressure (Exit) 120/64       Heart Rate (Admit) 63 bpm       Heart Rate (Exercise) 91 bpm       Heart Rate (Exit) 69 bpm       Rating of Perceived Exertion (Exercise) 10.5       Symptoms none       Comments Pt first  day in the Pritikin ICR program       Duration Progress to 30 minutes of  aerobic without signs/symptoms of physical distress       Intensity THRR unchanged         Progression   Progression Continue to progress workloads to maintain intensity without signs/symptoms of physical  distress.       Average METs 2.5         Resistance Training   Training Prescription Yes       Weight 4       Reps 10-15       Time 10 Minutes         Recumbant Bike   Level 1       RPM 66       Watts 17       Minutes 15       METs 2         Recumbant Elliptical   Level 1       RPM 58       Watts 76       Minutes 15       METs 3                Exercise Comments:   Exercise Comments     Row Name 12/11/22 0825           Exercise Comments Pt first day in the Pritikin ICR program. Pt tolerated exercise well with an average MET level of 2.5. Pt is learning his THRR, RPE and ExRx. Off to a great start                Exercise Goals and Review:   Exercise Goals     Row Name 12/07/22 0804             Exercise Goals   Increase Physical Activity Yes       Intervention Provide advice, education, support and counseling about physical activity/exercise needs.;Develop an individualized exercise prescription for aerobic and resistive training based on initial evaluation findings, risk stratification, comorbidities and participant's personal goals.       Expected Outcomes Short Term: Attend rehab on a regular basis to increase amount of physical activity.;Long Term: Add in home exercise to make exercise part of routine and to increase amount of physical activity.;Long Term: Exercising regularly at least 3-5 days a week.       Increase Strength and Stamina Yes       Intervention Provide advice, education, support and counseling about physical activity/exercise needs.;Develop an individualized exercise prescription for aerobic and resistive training based on initial evaluation findings, risk stratification, comorbidities and participant's personal goals.       Expected Outcomes Short Term: Increase workloads from initial exercise prescription for resistance, speed, and METs.;Short Term: Perform resistance training exercises routinely during rehab and add in resistance  training at home;Long Term: Improve cardiorespiratory fitness, muscular endurance and strength as measured by increased METs and functional capacity ( )       Able to understand and use rate of perceived exertion (RPE) scale Yes       Intervention Provide education and explanation on how to use RPE scale       Expected Outcomes Long Term:  Able to use RPE to guide intensity level when exercising independently;Short Term: Able to use RPE daily in rehab to express subjective intensity level       Knowledge and understanding of Target Heart Rate Range (THRR) Yes  Intervention Provide education and explanation of THRR including how the numbers were predicted and where they are located for reference       Expected Outcomes Short Term: Able to state/look up THRR;Short Term: Able to use daily as guideline for intensity in rehab;Long Term: Able to use THRR to govern intensity when exercising independently       Understanding of Exercise Prescription Yes       Intervention Provide education, explanation, and written materials on patient's individual exercise prescription       Expected Outcomes Short Term: Able to explain program exercise prescription;Long Term: Able to explain home exercise prescription to exercise independently                Exercise Goals Re-Evaluation :  Exercise Goals Re-Evaluation     Row Name 12/11/22 0826             Exercise Goal Re-Evaluation   Exercise Goals Review Increase Physical Activity;Understanding of Exercise Prescription;Increase Strength and Stamina;Knowledge and understanding of Target Heart Rate Range (THRR);Able to understand and use rate of perceived exertion (RPE) scale       Comments Pt first day in the Pritikin ICR program. Pt tolerated exercise well with an average MET level of 2.5. Pt is learning his THRR, RPE and ExRx. Off to a great start       Expected Outcomes Will continue to monitor pt and progress workloads as tolerated without sign  or symptom                Discharge Exercise Prescription (Final Exercise Prescription Changes):  Exercise Prescription Changes - 12/11/22 0822       Response to Exercise   Blood Pressure (Admit) 140/88    Blood Pressure (Exercise) 140/84    Blood Pressure (Exit) 120/64    Heart Rate (Admit) 63 bpm    Heart Rate (Exercise) 91 bpm    Heart Rate (Exit) 69 bpm    Rating of Perceived Exertion (Exercise) 10.5    Symptoms none    Comments Pt first day in the Pritikin ICR program    Duration Progress to 30 minutes of  aerobic without signs/symptoms of physical distress    Intensity THRR unchanged      Progression   Progression Continue to progress workloads to maintain intensity without signs/symptoms of physical distress.    Average METs 2.5      Resistance Training   Training Prescription Yes    Weight 4    Reps 10-15    Time 10 Minutes      Recumbant Bike   Level 1    RPM 66    Watts 17    Minutes 15    METs 2      Recumbant Elliptical   Level 1    RPM 58    Watts 76    Minutes 15    METs 3             Nutrition:  Target Goals: Understanding of nutrition guidelines, daily intake of sodium 1500mg , cholesterol 200mg , calories 30% from fat and 7% or less from saturated fats, daily to have 5 or more servings of fruits and vegetables.  Biometrics:  Pre Biometrics - 12/07/22 0801       Pre Biometrics   Waist Circumference 48 inches    Hip Circumference 45.5 inches    Waist to Hip Ratio 1.05 %    Triceps Skinfold 36 mm    % Body Fat 36 %  Grip Strength 38 kg    Flexibility 13.5 in    Single Leg Stand 12.2 seconds              Nutrition Therapy Plan and Nutrition Goals:   Nutrition Assessments:  MEDIFICTS Score Key: >=70 Need to make dietary changes  40-70 Heart Healthy Diet <= 40 Therapeutic Level Cholesterol Diet    Picture Your Plate Scores: <82 Unhealthy dietary pattern with much room for improvement. 41-50 Dietary pattern  unlikely to meet recommendations for good health and room for improvement. 51-60 More healthful dietary pattern, with some room for improvement.  >60 Healthy dietary pattern, although there may be some specific behaviors that could be improved.    Nutrition Goals Re-Evaluation:   Nutrition Goals Re-Evaluation:   Nutrition Goals Discharge (Final Nutrition Goals Re-Evaluation):   Psychosocial: Target Goals: Acknowledge presence or absence of significant depression and/or stress, maximize coping skills, provide positive support system. Participant is able to verbalize types and ability to use techniques and skills needed for reducing stress and depression.  Initial Review & Psychosocial Screening:  Initial Psych Review & Screening - 12/07/22 0823       Initial Review   Current issues with Current Depression;Current Stress Concerns    Source of Stress Concerns Chronic Illness;Family;Financial;Occupation;Unable to participate in former interests or hobbies    Comments Bran shared that he has had some feelings of anxiety/depression/stress since his MI. He says he has had to make a lot of changes which has been overwhelming at times, and overall just feels fatigue. He denies any need for additional resources at this time.      Family Dynamics   Good Support System? Yes   Tajiddin has his wife for support     Barriers   Psychosocial barriers to participate in program The patient should benefit from training in stress management and relaxation.      Screening Interventions   Interventions To provide support and resources with identified psychosocial needs;Provide feedback about the scores to participant;Encouraged to exercise    Expected Outcomes Long Term Goal: Stressors or current issues are controlled or eliminated.;Short Term goal: Identification and review with participant of any Quality of Life or Depression concerns found by scoring the questionnaire.;Long Term goal: The participant  improves quality of Life and PHQ9 Scores as seen by post scores and/or verbalization of changes;Short Term goal: Utilizing psychosocial counselor, staff and physician to assist with identification of specific Stressors or current issues interfering with healing process. Setting desired goal for each stressor or current issue identified.             Quality of Life Scores:  Quality of Life - 12/07/22 0846       Quality of Life   Select Quality of Life      Quality of Life Scores   Health/Function Pre 15.43 %    Socioeconomic Pre 18.69 %    Psych/Spiritual Pre 22.36 %    Family Pre 21.1 %    GLOBAL Pre 18.37 %            Scores of 19 and below usually indicate a poorer quality of life in these areas.  A difference of  2-3 points is a clinically meaningful difference.  A difference of 2-3 points in the total score of the Quality of Life Index has been associated with significant improvement in overall quality of life, self-image, physical symptoms, and general health in studies assessing change in quality of life.  PHQ-9: Review  Flowsheet  More data exists      12/07/2022 11/09/2022 05/01/2022 09/15/2021 05/12/2021  Depression screen PHQ 2/9  Decreased Interest 1 3 0 0 0  Down, Depressed, Hopeless 1 1 0 0 0  PHQ - 2 Score 2 4 0 0 0  Altered sleeping 2 2 0 - -  Tired, decreased energy 2 3 0 - -  Change in appetite 0 0 0 - -  Feeling bad or failure about yourself  0 1 0 - -  Trouble concentrating 0 2 0 - -  Moving slowly or fidgety/restless 0 2 0 - -  Suicidal thoughts 0 0 0 - -  PHQ-9 Score 6 14 0 - -  Difficult doing work/chores Somewhat difficult Extremely dIfficult Not difficult at all - -    Details           Interpretation of Total Score  Total Score Depression Severity:  1-4 = Minimal depression, 5-9 = Mild depression, 10-14 = Moderate depression, 15-19 = Moderately severe depression, 20-27 = Severe depression   Psychosocial Evaluation and  Intervention:   Psychosocial Re-Evaluation:  Psychosocial Re-Evaluation     Row Name 12/11/22 1801             Psychosocial Re-Evaluation   Current issues with Current Stress Concerns;Current Depression       Comments Ishaq did not voice any increased concerns or stresssors on his first day of exercise. Will review PHq2-9 amd quality of life questionnaire in the upcoming week       Expected Outcomes Misael will have controlled or decreased depression upon completion of cardiac rehab.       Interventions Stress management education;Encouraged to attend Cardiac Rehabilitation for the exercise;Relaxation education       Continue Psychosocial Services  Follow up required by staff         Initial Review   Source of Stress Concerns Chronic Illness;Unable to participate in former interests or hobbies;Financial;Family;Unable to perform yard/household activities       Comments Will continue to monitor and offer support as needed.                Psychosocial Discharge (Final Psychosocial Re-Evaluation):  Psychosocial Re-Evaluation - 12/11/22 1801       Psychosocial Re-Evaluation   Current issues with Current Stress Concerns;Current Depression    Comments Ruthford did not voice any increased concerns or stresssors on his first day of exercise. Will review PHq2-9 amd quality of life questionnaire in the upcoming week    Expected Outcomes Alonso will have controlled or decreased depression upon completion of cardiac rehab.    Interventions Stress management education;Encouraged to attend Cardiac Rehabilitation for the exercise;Relaxation education    Continue Psychosocial Services  Follow up required by staff      Initial Review   Source of Stress Concerns Chronic Illness;Unable to participate in former interests or hobbies;Financial;Family;Unable to perform yard/household activities    Comments Will continue to monitor and offer support as needed.             Vocational  Rehabilitation: Provide vocational rehab assistance to qualifying candidates.   Vocational Rehab Evaluation & Intervention:  Vocational Rehab - 12/07/22 4098       Initial Vocational Rehab Evaluation & Intervention   Assessment shows need for Vocational Rehabilitation No   Kaston is hoping to return to work soon            Education: Education Goals: Education classes will be provided on a  weekly basis, covering required topics. Participant will state understanding/return demonstration of topics presented.     Core Videos: Exercise    Move It!  Clinical staff conducted group or individual video education with verbal and written material and guidebook.  Patient learns the recommended Pritikin exercise program. Exercise with the goal of living a long, healthy life. Some of the health benefits of exercise include controlled diabetes, healthier blood pressure levels, improved cholesterol levels, improved heart and lung capacity, improved sleep, and better body composition. Everyone should speak with their doctor before starting or changing an exercise routine.  Biomechanical Limitations Clinical staff conducted group or individual video education with verbal and written material and guidebook.  Patient learns how biomechanical limitations can impact exercise and how we can mitigate and possibly overcome limitations to have an impactful and balanced exercise routine.  Body Composition Clinical staff conducted group or individual video education with verbal and written material and guidebook.  Patient learns that body composition (ratio of muscle mass to fat mass) is a key component to assessing overall fitness, rather than body weight alone. Increased fat mass, especially visceral belly fat, can put [place] at increased risk for metabolic syndrome, type 2 diabetes, heart disease, and even death. It is recommended to combine diet and exercise (cardiovascular and resistance training) to improve  your body composition. Seek guidance from your physician and exercise physiologist before implementing an exercise routine.  Exercise Action Plan Clinical staff conducted group or individual video education with verbal and written material and guidebook.  Patient learns the recommended strategies to achieve and enjoy long-term exercise adherence, including variety, self-motivation, self-efficacy, and positive decision making. Benefits of exercise include fitness, good health, weight management, more energy, better sleep, less stress, and overall well-being.  Medical   Heart Disease Risk Reduction Clinical staff conducted group or individual video education with verbal and written material and guidebook.  Patient learns our heart is our most vital organ as it circulates oxygen, nutrients, white blood cells, and hormones throughout the entire body, and carries waste away. Data supports a plant-based eating plan like the Pritikin Program for its effectiveness in slowing progression of and reversing heart disease. The video provides a number of recommendations to address heart disease.   Metabolic Syndrome and Belly Fat  Clinical staff conducted group or individual video education with verbal and written material and guidebook.  Patient learns what metabolic syndrome is, how it leads to heart disease, and how one can reverse it and keep it from coming back. You have metabolic syndrome if you have 3 of the following 5 criteria: abdominal obesity, high blood pressure, high triglycerides, low HDL cholesterol, and high blood sugar.  Hypertension and Heart Disease Clinical staff conducted group or individual video education with verbal and written material and guidebook.  Patient learns that high blood pressure, or hypertension, is very common in the [place]. Hypertension is largely due to excessive salt intake, but other important risk factors include being overweight, physical inactivity, drinking  too much alcohol, smoking, and not eating enough potassium from fruits and vegetables. High blood pressure is a leading risk factor for heart attack, stroke, congestive heart failure, dementia, kidney failure, and premature death. Long-term effects of excessive salt intake include stiffening of the arteries and thickening of heart muscle and organ damage. Recommendations include ways to reduce hypertension and the risk of heart disease.  Diseases of Our Time - Focusing on Diabetes Clinical staff conducted group or individual video education with verbal and  written material and guidebook.  Patient learns why the best way to stop diseases of our time is prevention, through food and other lifestyle changes. Medicine (such as prescription pills and surgeries) is often only a Band-Aid on the problem, not a long-term solution. Most common diseases of our time include obesity, type 2 diabetes, hypertension, heart disease, and cancer. The Pritikin Program is recommended and has been proven to help reduce, reverse, and/or prevent the damaging effects of metabolic syndrome.  Nutrition   Overview of the Pritikin Eating Plan  Clinical staff conducted group or individual video education with verbal and written material and guidebook.  Patient learns about the Pritikin Eating Plan for disease risk reduction. The Pritikin Eating Plan emphasizes a wide variety of unrefined, minimally-processed carbohydrates, like fruits, vegetables, whole grains, and legumes. Go, Caution, and Stop food choices are explained. Plant-based and lean animal proteins are emphasized. Rationale provided for low sodium intake for blood pressure control, low added sugars for blood sugar stabilization, and low added fats and oils for coronary artery disease risk reduction and weight management.  Calorie Density  Clinical staff conducted group or individual video education with verbal and written material and guidebook.  Patient learns about  calorie density and how it impacts the Pritikin Eating Plan. Knowing the characteristics of the food you choose will help you decide whether those foods will lead to weight gain or weight loss, and whether you want to consume more or less of them. Weight loss is usually a side effect of the Pritikin Eating Plan because of its focus on low calorie-dense foods.  Label Reading  Clinical staff conducted group or individual video education with verbal and written material and guidebook.  Patient learns about the Pritikin recommended label reading guidelines and corresponding recommendations regarding calorie density, added sugars, sodium content, and whole grains.  Dining Out - Part 1  Clinical staff conducted group or individual video education with verbal and written material and guidebook.  Patient learns that restaurant meals can be sabotaging because they can be so high in calories, fat, sodium, and/or sugar. Patient learns recommended strategies on how to positively address this and avoid unhealthy pitfalls.  Facts on Fats  Clinical staff conducted group or individual video education with verbal and written material and guidebook.  Patient learns that lifestyle modifications can be just as effective, if not more so, as many medications for lowering your risk of heart disease. A Pritikin lifestyle can help to reduce your risk of inflammation and atherosclerosis (cholesterol build-up, or plaque, in the artery walls). Lifestyle interventions such as dietary choices and physical activity address the cause of atherosclerosis. A review of the types of fats and their impact on blood cholesterol levels, along with dietary recommendations to reduce fat intake is also included.  Nutrition Action Plan  Clinical staff conducted group or individual video education with verbal and written material and guidebook.  Patient learns how to incorporate Pritikin recommendations into their lifestyle. Recommendations  include planning and keeping personal health goals in mind as an important part of their success.  Healthy Mind-Set    Healthy Minds, Bodies, Hearts  Clinical staff conducted group or individual video education with verbal and written material and guidebook.  Patient learns how to identify when they are stressed. Video will discuss the impact of that stress, as well as the many benefits of stress management. Patient will also be introduced to stress management techniques. The way we think, act, and feel has an impact on our  hearts.  How Our Thoughts Can Heal Our Hearts  Clinical staff conducted group or individual video education with verbal and written material and guidebook.  Patient learns that negative thoughts can cause depression and anxiety. This can result in negative lifestyle behavior and serious health problems. Cognitive behavioral therapy is an effective method to help control our thoughts in order to change and improve our emotional outlook.  Additional Videos:  Exercise    Improving Performance  Clinical staff conducted group or individual video education with verbal and written material and guidebook.  Patient learns to use a non-linear approach by alternating intensity levels and lengths of time spent exercising to help burn more calories and lose more body fat. Cardiovascular exercise helps improve heart health, metabolism, hormonal balance, blood sugar control, and recovery from fatigue. Resistance training improves strength, endurance, balance, coordination, reaction time, metabolism, and muscle mass. Flexibility exercise improves circulation, posture, and balance. Seek guidance from your physician and exercise physiologist before implementing an exercise routine and learn your capabilities and proper form for all exercise.  Introduction to Yoga  Clinical staff conducted group or individual video education with verbal and written material and guidebook.  Patient learns about  yoga, a discipline of the coming together of mind, breath, and body. The benefits of yoga include improved flexibility, improved range of motion, better posture and core strength, increased lung function, weight loss, and positive self-image. Yoga's heart health benefits include lowered blood pressure, healthier heart rate, decreased cholesterol and triglyceride levels, improved immune function, and reduced stress. Seek guidance from your physician and exercise physiologist before implementing an exercise routine and learn your capabilities and proper form for all exercise.  Medical   Aging: Enhancing Your Quality of Life  Clinical staff conducted group or individual video education with verbal and written material and guidebook.  Patient learns key strategies and recommendations to stay in good physical health and enhance quality of life, such as prevention strategies, having an advocate, securing a Health Care Proxy and Power of Attorney, and keeping a list of medications and system for tracking them. It also discusses how to avoid risk for bone loss.  Biology of Weight Control  Clinical staff conducted group or individual video education with verbal and written material and guidebook.  Patient learns that weight gain occurs because we consume more calories than we burn (eating more, moving less). Even if your body weight is normal, you may have higher ratios of fat compared to muscle mass. Too much body fat puts you at increased risk for cardiovascular disease, heart attack, stroke, type 2 diabetes, and obesity-related cancers. In addition to exercise, following the Pritikin Eating Plan can help reduce your risk.  Decoding Lab Results  Clinical staff conducted group or individual video education with verbal and written material and guidebook.  Patient learns that lab test reflects one measurement whose values change over time and are influenced by many factors, including medication, stress, sleep,  exercise, food, hydration, pre-existing medical conditions, and more. It is recommended to use the knowledge from this video to become more involved with your lab results and evaluate your numbers to speak with your doctor.   Diseases of Our Time - Overview  Clinical staff conducted group or individual video education with verbal and written material and guidebook.  Patient learns that according to the CDC, 50% to 70% of chronic diseases (such as obesity, type 2 diabetes, elevated lipids, hypertension, and heart disease) are avoidable through lifestyle improvements including healthier food choices,  listening to satiety cues, and increased physical activity.  Sleep Disorders Clinical staff conducted group or individual video education with verbal and written material and guidebook.  Patient learns how good quality and duration of sleep are important to overall health and well-being. Patient also learns about sleep disorders and how they impact health along with recommendations to address them, including discussing with a physician.  Nutrition  Dining Out - Part 2 Clinical staff conducted group or individual video education with verbal and written material and guidebook.  Patient learns how to plan ahead and communicate in order to maximize their dining experience in a healthy and nutritious manner. Included are recommended food choices based on the type of restaurant the patient is visiting.   Fueling a Banker conducted group or individual video education with verbal and written material and guidebook.  There is a strong connection between our food choices and our health. Diseases like obesity and type 2 diabetes are very prevalent and are in large-part due to lifestyle choices. The Pritikin Eating Plan provides plenty of food and hunger-curbing satisfaction. It is easy to follow, affordable, and helps reduce health risks.  Menu Workshop  Clinical staff conducted group or  individual video education with verbal and written material and guidebook.  Patient learns that restaurant meals can sabotage health goals because they are often packed with calories, fat, sodium, and sugar. Recommendations include strategies to plan ahead and to communicate with the manager, chef, or server to help order a healthier meal.  Planning Your Eating Strategy  Clinical staff conducted group or individual video education with verbal and written material and guidebook.  Patient learns about the Pritikin Eating Plan and its benefit of reducing the risk of disease. The Pritikin Eating Plan does not focus on calories. Instead, it emphasizes high-quality, nutrient-rich foods. By knowing the characteristics of the foods, we choose, we can determine their calorie density and make informed decisions.  Targeting Your Nutrition Priorities  Clinical staff conducted group or individual video education with verbal and written material and guidebook.  Patient learns that lifestyle habits have a tremendous impact on disease risk and progression. This video provides eating and physical activity recommendations based on your personal health goals, such as reducing LDL cholesterol, losing weight, preventing or controlling type 2 diabetes, and reducing high blood pressure.  Vitamins and Minerals  Clinical staff conducted group or individual video education with verbal and written material and guidebook.  Patient learns different ways to obtain key vitamins and minerals, including through a recommended healthy diet. It is important to discuss all supplements you take with your doctor.   Healthy Mind-Set    Smoking Cessation  Clinical staff conducted group or individual video education with verbal and written material and guidebook.  Patient learns that cigarette smoking and tobacco addiction pose a serious health risk which affects millions of people. Stopping smoking will significantly reduce the risk of  heart disease, lung disease, and many forms of cancer. Recommended strategies for quitting are covered, including working with your doctor to develop a successful plan.  Culinary   Becoming a Set designer conducted group or individual video education with verbal and written material and guidebook.  Patient learns that cooking at home can be healthy, cost-effective, quick, and puts them in control. Keys to cooking healthy recipes will include looking at your recipe, assessing your equipment needs, planning ahead, making it simple, choosing cost-effective seasonal ingredients, and limiting the use of added  fats, salts, and sugars.  Cooking - Breakfast and Snacks  Clinical staff conducted group or individual video education with verbal and written material and guidebook.  Patient learns how important breakfast is to satiety and nutrition through the entire day. Recommendations include key foods to eat during breakfast to help stabilize blood sugar levels and to prevent overeating at meals later in the day. Planning ahead is also a key component.  Cooking - Educational psychologist conducted group or individual video education with verbal and written material and guidebook.  Patient learns eating strategies to improve overall health, including an approach to cook more at home. Recommendations include thinking of animal protein as a side on your plate rather than center stage and focusing instead on lower calorie dense options like vegetables, fruits, whole grains, and plant-based proteins, such as beans. Making sauces in large quantities to freeze for later and leaving the skin on your vegetables are also recommended to maximize your experience.  Cooking - Healthy Salads and Dressing Clinical staff conducted group or individual video education with verbal and written material and guidebook.  Patient learns that vegetables, fruits, whole grains, and legumes are the foundations of  the Pritikin Eating Plan. Recommendations include how to incorporate each of these in flavorful and healthy salads, and how to create homemade salad dressings. Proper handling of ingredients is also covered. Cooking - Soups and [employer] - Soups and Desserts Clinical staff conducted group or individual video education with verbal and written material and guidebook.  Patient learns that Pritikin soups and desserts make for easy, nutritious, and delicious snacks and meal components that are low in sodium, fat, sugar, and calorie density, while high in vitamins, minerals, and filling fiber. Recommendations include simple and healthy ideas for soups and desserts.   Overview     The Pritikin Solution Program Overview Clinical staff conducted group or individual video education with verbal and written material and guidebook.  Patient learns that the results of the Pritikin Program have been documented in more than 100 articles published in peer-reviewed journals, and the benefits include reducing risk factors for (and, in some cases, even reversing) high cholesterol, high blood pressure, type 2 diabetes, obesity, and more! An overview of the three key pillars of the Pritikin Program will be covered: eating well, doing regular exercise, and having a healthy mind-set.  WORKSHOPS  Exercise: Exercise Basics: Building Your Action Plan Clinical staff led group instruction and group discussion with PowerPoint presentation and patient guidebook. To enhance the learning environment the use of posters, models and videos may be added. At the conclusion of this workshop, patients will comprehend the difference between physical activity and exercise, as well as the benefits of incorporating both, into their routine. Patients will understand the FITT (Frequency, Intensity, Time, and Type) principle and how to use it to build an exercise action plan. In addition, safety concerns and other considerations for  exercise and cardiac rehab will be addressed by the presenter. The purpose of this lesson is to promote a comprehensive and effective weekly exercise routine in order to improve patients' overall level of fitness.   Managing Heart Disease: Your Path to a Healthier Heart Clinical staff led group instruction and group discussion with PowerPoint presentation and patient guidebook. To enhance the learning environment the use of posters, models and videos may be added.At the conclusion of this workshop, patients will understand the anatomy and physiology of the heart. Additionally, they will understand how Pritikin's three  pillars impact the risk factors, the progression, and the management of heart disease.  The purpose of this lesson is to provide a high-level overview of the heart, heart disease, and how the Pritikin lifestyle positively impacts risk factors.  Exercise Biomechanics Clinical staff led group instruction and group discussion with PowerPoint presentation and patient guidebook. To enhance the learning environment the use of posters, models and videos may be added. Patients will learn how the structural parts of their bodies function and how these functions impact their daily activities, movement, and exercise. Patients will learn how to promote a neutral spine, learn how to manage pain, and identify ways to improve their physical movement in order to promote healthy living. The purpose of this lesson is to expose patients to common physical limitations that impact physical activity. Participants will learn practical ways to adapt and manage aches and pains, and to minimize their effect on regular exercise. Patients will learn how to maintain good posture while sitting, walking, and lifting.  Balance Training and Fall Prevention  Clinical staff led group instruction and group discussion with PowerPoint presentation and patient guidebook. To enhance the learning environment the use of  posters, models and videos may be added. At the conclusion of this workshop, patients will understand the importance of their sensorimotor skills (vision, proprioception, and the vestibular system) in maintaining their ability to balance as they age. Patients will apply a variety of balancing exercises that are appropriate for their current level of function. Patients will understand the common causes for poor balance, possible solutions to these problems, and ways to modify their physical environment in order to minimize their fall risk. The purpose of this lesson is to teach patients about the importance of maintaining balance as they age and ways to minimize their risk of falling.  WORKSHOPS   Nutrition:  Fueling a Ship broker led group instruction and group discussion with PowerPoint presentation and patient guidebook. To enhance the learning environment the use of posters, models and videos may be added. Patients will review the foundational principles of the Pritikin Eating Plan and understand what constitutes a serving size in each of the food groups. Patients will also learn Pritikin-friendly foods that are better choices when away from home and review make-ahead meal and snack options. Calorie density will be reviewed and applied to three nutrition priorities: weight maintenance, weight loss, and weight gain. The purpose of this lesson is to reinforce (in a group setting) the key concepts around what patients are recommended to eat and how to apply these guidelines when away from home by planning and selecting Pritikin-friendly options. Patients will understand how calorie density may be adjusted for different weight management goals.  Mindful Eating  Clinical staff led group instruction and group discussion with PowerPoint presentation and patient guidebook. To enhance the learning environment the use of posters, models and videos may be added. Patients will briefly review the  concepts of the Pritikin Eating Plan and the importance of low-calorie dense foods. The concept of mindful eating will be introduced as well as the importance of paying attention to internal hunger signals. Triggers for non-hunger eating and techniques for dealing with triggers will be explored. The purpose of this lesson is to provide patients with the opportunity to review the basic principles of the Pritikin Eating Plan, discuss the value of eating mindfully and how to measure internal cues of hunger and fullness using the Hunger Scale. Patients will also discuss reasons for non-hunger eating and learn  strategies to use for controlling emotional eating.  Targeting Your Nutrition Priorities Clinical staff led group instruction and group discussion with PowerPoint presentation and patient guidebook. To enhance the learning environment the use of posters, models and videos may be added. Patients will learn how to determine their genetic susceptibility to disease by reviewing their family history. Patients will gain insight into the importance of diet as part of an overall healthy lifestyle in mitigating the impact of genetics and other environmental insults. The purpose of this lesson is to provide patients with the opportunity to assess their personal nutrition priorities by looking at their family history, their own health history and current risk factors. Patients will also be able to discuss ways of prioritizing and modifying the Pritikin Eating Plan for their highest risk areas  Menu  Clinical staff led group instruction and group discussion with PowerPoint presentation and patient guidebook. To enhance the learning environment the use of posters, models and videos may be added. Using menus brought in from [employer], or printed from [employer], patients will apply the Pritikin dining out guidelines that were presented in the [employer] video. Patients will also be able to  practice these guidelines in a variety of provided scenarios. The purpose of this lesson is to provide patients with the opportunity to practice hands-on learning of the Pritikin Dining Out guidelines with actual menus and practice scenarios.  Label Reading Clinical staff led group instruction and group discussion with PowerPoint presentation and patient guidebook. To enhance the learning environment the use of posters, models and videos may be added. Patients will review and discuss the Pritikin label reading guidelines presented in Pritikin's Label Reading Educational series video. Using fool labels brought in from local grocery stores and markets, patients will apply the label reading guidelines and determine if the packaged food meet the Pritikin guidelines. The purpose of this lesson is to provide patients with the opportunity to review, discuss, and practice hands-on learning of the Pritikin Label Reading guidelines with actual packaged food labels. Cooking School  Pritikin's [employer] are designed to teach patients ways to prepare quick, simple, and affordable recipes at home. The importance of nutrition's role in chronic disease risk reduction is reflected in its emphasis in the overall Pritikin program. By learning how to prepare essential core Pritikin Eating Plan recipes, patients will increase control over what they eat; be able to customize the flavor of foods without the use of added salt, sugar, or fat; and improve the quality of the food they consume. By learning a set of core recipes which are easily assembled, quickly prepared, and affordable, patients are more likely to prepare more healthy foods at home. These workshops focus on convenient breakfasts, simple entres, side dishes, and desserts which can be prepared with minimal effort and are consistent with nutrition recommendations for cardiovascular risk reduction. Cooking [employer] are taught by a Interior and spatial designer (RD) who has been trained by the [employer]. The chef or RD has a clear understanding of the importance of minimizing - if not completely eliminating - added fat, sugar, and sodium in recipes. Throughout the series of Cooking School Workshop sessions, patients will learn about healthy ingredients and efficient methods of cooking to build confidence in their capability to prepare    Cooking School weekly topics:  Adding Flavor- Sodium-Free  Fast and Healthy Breakfasts  Powerhouse Plant-Based Proteins  Satisfying Salads and Dressings  Simple Sides and Sauces  International  Cuisine-Spotlight on the Blue Zones  Delicious Desserts  Savory Soups  [employer] - Meals in a Astronomer Appetizers and Snacks  Comforting Weekend Breakfasts  One-Pot Wonders   Fast Evening Meals  Landscape architect Your Pritikin Plate  WORKSHOPS   Healthy Mindset (Psychosocial):  Focused Goals, Sustainable Changes Clinical staff led group instruction and group discussion with PowerPoint presentation and patient guidebook. To enhance the learning environment the use of posters, models and videos may be added. Patients will be able to apply effective goal setting strategies to establish at least one personal goal, and then take consistent, meaningful action toward that goal. They will learn to identify common barriers to achieving personal goals and develop strategies to overcome them. Patients will also gain an understanding of how our mind-set can impact our ability to achieve goals and the importance of cultivating a positive and growth-oriented mind-set. The purpose of this lesson is to provide patients with a deeper understanding of how to set and achieve personal goals, as well as the tools and strategies needed to overcome common obstacles which may arise along the way.  From Head to Heart: The Power of a Healthy Outlook  Clinical staff led group instruction and  group discussion with PowerPoint presentation and patient guidebook. To enhance the learning environment the use of posters, models and videos may be added. Patients will be able to recognize and describe the impact of emotions and mood on physical health. They will discover the importance of self-care and explore self-care practices which may work for them. Patients will also learn how to utilize the 4 C's to cultivate a healthier outlook and better manage stress and challenges. The purpose of this lesson is to demonstrate to patients how a healthy outlook is an essential part of maintaining good health, especially as they continue their cardiac rehab journey.  Healthy Sleep for a Healthy Heart Clinical staff led group instruction and group discussion with PowerPoint presentation and patient guidebook. To enhance the learning environment the use of posters, models and videos may be added. At the conclusion of this workshop, patients will be able to demonstrate knowledge of the importance of sleep to overall health, well-being, and quality of life. They will understand the symptoms of, and treatments for, common sleep disorders. Patients will also be able to identify daytime and nighttime behaviors which impact sleep, and they will be able to apply these tools to help manage sleep-related challenges. The purpose of this lesson is to provide patients with a general overview of sleep and outline the importance of quality sleep. Patients will learn about a few of the most common sleep disorders. Patients will also be introduced to the concept of "sleep hygiene," and discover ways to self-manage certain sleeping problems through simple daily behavior changes. Finally, the workshop will motivate patients by clarifying the links between quality sleep and their goals of heart-healthy living.   Recognizing and Reducing Stress Clinical staff led group instruction and group discussion with PowerPoint presentation and  patient guidebook. To enhance the learning environment the use of posters, models and videos may be added. At the conclusion of this workshop, patients will be able to understand the types of stress reactions, differentiate between acute and chronic stress, and recognize the impact that chronic stress has on their health. They will also be able to apply different coping mechanisms, such as reframing negative self-talk. Patients will have the opportunity to practice a variety of stress management techniques, such as deep  abdominal breathing, progressive muscle relaxation, and/or guided imagery.  The purpose of this lesson is to educate patients on the role of stress in their lives and to provide healthy techniques for coping with it.  Learning Barriers/Preferences:  Learning Barriers/Preferences - 12/07/22 0825       Learning Barriers/Preferences   Learning Barriers Sight   wears glasses   Learning Preferences Audio;Computer/Internet;Group Instruction;Individual Instruction;Skilled Demonstration;Verbal Instruction;Video             Education Topics:  Knowledge Questionnaire Score:  Knowledge Questionnaire Score - 12/07/22 0838       Knowledge Questionnaire Score   Pre Score 21/24             Core Components/Risk Factors/Patient Goals at Admission:  Personal Goals and Risk Factors at Admission - 12/07/22 0826       Core Components/Risk Factors/Patient Goals on Admission    Weight Management Yes;Obesity;Weight Loss    Intervention Weight Management: Develop a combined nutrition and exercise program designed to reach desired caloric intake, while maintaining appropriate intake of nutrient and fiber, sodium and fats, and appropriate energy expenditure required for the weight goal.;Weight Management: Provide education and appropriate resources to help participant work on and attain dietary goals.;Weight Management/Obesity: Establish reasonable short term and long term weight  goals.;Obesity: Provide education and appropriate resources to help participant work on and attain dietary goals.    Expected Outcomes Short Term: Continue to assess and modify interventions until short term weight is achieved;Long Term: Adherence to nutrition and physical activity/exercise program aimed toward attainment of established weight goal;Weight Loss: Understanding of general recommendations for a balanced deficit meal plan, which promotes 1-2 lb weight loss per week and includes a negative energy balance of [phone] kcal/d;Understanding recommendations for meals to include 15-35% energy as protein, 25-35% energy from fat, 35-60% energy from carbohydrates, less than 200mg  of dietary cholesterol, 20-35 gm of total fiber daily;Understanding of distribution of calorie intake throughout the day with the consumption of 4-5 meals/snacks    Hypertension Yes    Intervention Provide education on lifestyle modifcations including regular physical activity/exercise, weight management, moderate sodium restriction and increased consumption of fresh fruit, vegetables, and low fat dairy, alcohol moderation, and smoking cessation.;Monitor prescription use compliance.    Expected Outcomes Short Term: Continued assessment and intervention until BP is < 140/6mm HG in hypertensive participants. < 130/39mm HG in hypertensive participants with diabetes, heart failure or chronic kidney disease.;Long Term: Maintenance of blood pressure at goal levels.    Lipids Yes    Intervention Provide education and support for participant on nutrition & aerobic/resistive exercise along with prescribed medications to achieve LDL 70mg , HDL >40mg .    Expected Outcomes Short Term: Participant states understanding of desired cholesterol values and is compliant with medications prescribed. Participant is following exercise prescription and nutrition guidelines.;Long Term: Cholesterol controlled with medications as prescribed, with  individualized exercise RX and with personalized nutrition plan. Value goals: LDL < 70mg , HDL > 40 mg.    Stress Yes    Intervention Refer participants experiencing significant psychosocial distress to appropriate mental health specialists for further evaluation and treatment. When possible, include family members and significant others in education/counseling sessions.;Offer individual and/or small group education and counseling on adjustment to heart disease, stress management and health-related lifestyle change. Teach and support self-help strategies.    Expected Outcomes Short Term: Participant demonstrates changes in health-related behavior, relaxation and other stress management skills, ability to obtain effective social support, and compliance with psychotropic medications if prescribed.;Long  Term: Emotional wellbeing is indicated by absence of clinically significant psychosocial distress or social isolation.             Core Components/Risk Factors/Patient Goals Review:   Goals and Risk Factor Review     Row Name 12/11/22 1805             Core Components/Risk Factors/Patient Goals Review   Personal Goals Review Weight Management/Obesity;Stress;Hypertension;Lipids       Review Jshon started cardiac rehab on 12/11/22/ Kasem did well with exercise. Vital signs were stable.       Expected Outcomes Anass will continue to participate in cardiac rehab for exercise, nutrtion and lifestyle modifications                Core Components/Risk Factors/Patient Goals at Discharge (Final Review):   Goals and Risk Factor Review - 12/11/22 1805       Core Components/Risk Factors/Patient Goals Review   Personal Goals Review Weight Management/Obesity;Stress;Hypertension;Lipids    Review Donterrio started cardiac rehab on 12/11/22/ Fountain did well with exercise. Vital signs were stable.    Expected Outcomes Rolla will continue to participate in cardiac rehab for exercise, nutrtion and lifestyle  modifications             ITP Comments:  ITP Comments     Row Name 12/07/22 0802 12/11/22 1800         ITP Comments Armanda Magic, MD: Medical Director.  Introduction to the [facility]/Intensive Cardiac Rehab. Initial orientation packet reviewed with the patient. 30 Day ITP Review. Darrik started cardiac rehab on 12/11/22. Panth did well with exercise.               Comments: See ITP comments.Thayer Headings RN BSN

## 2022-12-11 NOTE — Progress Notes (Signed)
Daily Session Note  Patient Details  Name: Chase Walsh MRN: [id number] Date of Birth: December 08, 1970 Referring Provider:   Flowsheet Row CARDIAC REHAB PHASE II ORIENTATION from 12/07/2022 in [facility]  Referring Provider Percell Belt, MD       Encounter Date: 12/11/2022  Check In:  Session Check In - 12/11/22 0651       Check-In   Supervising physician immediately available to respond to emergencies [facility] MD immediately available    Physician(s) Carlos Levering, NP    Location [facility]    Staff Present Raford Pitcher, MS, ACSM-CEP, Exercise Physiologist;Emori Mumme Remus Loffler, RN, MHA;Johnny Hale Bogus, MS, Exercise Physiologist;Jetta Dan Humphreys BS, ACSM-CEP, Exercise Physiologist    Virtual Visit No    Medication changes reported     No    Fall or balance concerns reported    No    Tobacco Cessation No Change    Warm-up and Cool-down Performed as group-led instruction    Resistance Training Performed Yes    VAD Patient? No    PAD/SET Patient? No      Pain Assessment   Currently in Pain? No/denies    Pain Score 0-No pain    Multiple Pain Sites No             Capillary Blood Glucose: No results found for this or any previous visit (from the past 24 hour(s)).   Exercise Prescription Changes - 12/11/22 0822       Response to Exercise   Blood Pressure (Admit) 140/88    Blood Pressure (Exercise) 140/84    Blood Pressure (Exit) 120/64    Heart Rate (Admit) 63 bpm    Heart Rate (Exercise) 91 bpm    Heart Rate (Exit) 69 bpm    Rating of Perceived Exertion (Exercise) 10.5    Symptoms none    Comments Pt first day in the [facility] program    Duration Progress to 30 minutes of  aerobic without signs/symptoms of physical distress    Intensity THRR unchanged      Progression   Progression Continue to progress workloads to maintain intensity without signs/symptoms of physical distress.     Average METs 2.5      Resistance Training   Training Prescription Yes    Weight 4    Reps 10-15    Time 10 Minutes      Recumbant Bike   Level 1    RPM 66    Watts 17    Minutes 15    METs 2      Recumbant Elliptical   Level 1    RPM 58    Watts 76    Minutes 15    METs 3             Social History   Tobacco Use  Smoking Status Never  Smokeless Tobacco Never    Goals Met:  Independence with exercise equipment Exercise tolerated well No report of concerns or symptoms today Strength training completed today  Goals Unmet:  Not Applicable  Comments: Pt in today for cardiac rehab group exercise/education. Pt tolerated light exercise without difficulty. VSS, telemetry-Sinus rhythm, asymptomatic. Medication list reconciled on 12/07/22 with no changes reported today. PHQ9 score=6 and will meet with patient Wednesday to follow-up on score and QOL score.  Pt oriented to exercise equipment and gym routine today. Understanding verbalized.    Dr. Armanda Magic is Medical Director for Cardiac Rehab at [facility]  [facility].

## 2022-12-13 ENCOUNTER — Telehealth: Payer: Self-pay

## 2022-12-13 ENCOUNTER — Encounter (HOSPITAL_COMMUNITY)
Admission: RE | Admit: 2022-12-13 | Discharge: 2022-12-13 | Discharge status: Home / Self Care | Payer: No Typology Code available for payment source | Source: Ambulatory Visit | Attending: Cardiology

## 2022-12-13 DIAGNOSIS — I213 ST elevation (STEMI) myocardial infarction of unspecified site: Secondary | ICD-10-CM

## 2022-12-13 NOTE — Telephone Encounter (Signed)
Patient was called to see if he would like to see consoler due to depression screening being 14 - patient declined stating "I'm good"

## 2022-12-13 NOTE — Progress Notes (Signed)
Daily Session Note  Patient Details  Name: Chase Walsh MRN: [id number] Date of Birth: October 07, 1970 Referring Provider:   Flowsheet Row CARDIAC REHAB PHASE II ORIENTATION from 12/07/2022 in [facility]  Referring Provider Percell Belt, MD       Encounter Date: 12/13/2022  Check In:  Session Check In - 12/13/22 0651       Check-In   Supervising physician immediately available to respond to emergencies [facility] MD immediately available    Physician(s) Joni Reining, NP    Location [facility]    Staff Present Raford Pitcher, MS, ACSM-CEP, Exercise Physiologist;Geovannie Vilar Remus Loffler, RN, MHA;Olinty Peggye Pitt, MS, ACSM-CEP, Exercise Physiologist    Virtual Visit No    Medication changes reported     No    Fall or balance concerns reported    No    Tobacco Cessation No Change    Warm-up and Cool-down Performed as group-led instruction    Resistance Training Performed No    VAD Patient? No    PAD/SET Patient? No      Pain Assessment   Currently in Pain? No/denies    Pain Score 0-No pain    Multiple Pain Sites No             Capillary Blood Glucose: No results found for this or any previous visit (from the past 24 hour(s)).    Social History   Tobacco Use  Smoking Status Never  Smokeless Tobacco Never    Goals Met:  Independence with exercise equipment Exercise tolerated well No report of concerns or symptoms today  Goals Unmet:  Not Applicable  Comments: QUALITY OF LIFE SCORE REVIEW  Pt completed Quality of Life survey as a participant in Cardiac Rehab.  Scores 19.0 or below are considered low. Overall 18.37, Health and Function 15.43, socioeconomic 18.69, physiological and spiritual 22.36, family 21.10. Patient quality of life slightly altered by physical constraints which limits ability to perform as prior to recent cardiac illness.  PHQ9= 6, he shared he is not depressed and has the  support he needs from wife, friends and family in [place], he shared he does not need counseling services. The current areas that impact his QOL are Health and Function primarily lacking strength which he is now focusing on with exercise at cardiac rehab. In addition socioeconomic with facing financial challenges especially with out of state medical bills and trying to cover general household bills. He shared he is remaining optimistic as he has strong faith in God and support from family and friends. Offered emotional support reassurance and encouragement.  Will continue to monitor and intervene as necessary.      Dr. Armanda Magic is Medical Director for Cardiac Rehab at [facility].

## 2022-12-15 ENCOUNTER — Encounter (HOSPITAL_COMMUNITY)
Admission: RE | Admit: 2022-12-15 | Discharge: 2022-12-15 | Disposition: A | Discharge status: Home / Self Care | Payer: No Typology Code available for payment source | Source: Ambulatory Visit | Attending: Cardiology

## 2022-12-15 ENCOUNTER — Encounter: Payer: Self-pay | Admitting: Cardiology

## 2022-12-15 DIAGNOSIS — I213 ST elevation (STEMI) myocardial infarction of unspecified site: Secondary | ICD-10-CM | POA: Diagnosis not present

## 2022-12-18 ENCOUNTER — Encounter (HOSPITAL_COMMUNITY)
Admission: RE | Admit: 2022-12-18 | Discharge: 2022-12-18 | Disposition: A | Discharge status: Home / Self Care | Payer: No Typology Code available for payment source | Source: Ambulatory Visit | Attending: Cardiology | Admitting: Cardiology

## 2022-12-18 DIAGNOSIS — N1832 Chronic kidney disease, stage 3b: Secondary | ICD-10-CM | POA: Diagnosis not present

## 2022-12-18 DIAGNOSIS — I213 ST elevation (STEMI) myocardial infarction of unspecified site: Secondary | ICD-10-CM

## 2022-12-18 NOTE — Telephone Encounter (Signed)
Spoke to patient and and return to work note mailed and left at desk as ordered per Dr. Bjorn Pippin.

## 2022-12-18 NOTE — Telephone Encounter (Signed)
Yes he is cleared to return to work without limitation.  Can we write a letter for him?

## 2022-12-20 ENCOUNTER — Encounter (HOSPITAL_COMMUNITY): Payer: No Typology Code available for payment source

## 2022-12-22 ENCOUNTER — Encounter (HOSPITAL_COMMUNITY)
Admission: RE | Admit: 2022-12-22 | Discharge: 2022-12-22 | Disposition: A | Discharge status: Home / Self Care | Payer: No Typology Code available for payment source | Source: Ambulatory Visit | Attending: Cardiology | Admitting: Cardiology

## 2022-12-22 DIAGNOSIS — I213 ST elevation (STEMI) myocardial infarction of unspecified site: Secondary | ICD-10-CM | POA: Diagnosis not present

## 2022-12-25 ENCOUNTER — Encounter (HOSPITAL_COMMUNITY)
Admission: RE | Admit: 2022-12-25 | Discharge: 2022-12-25 | Disposition: A | Discharge status: Home / Self Care | Payer: No Typology Code available for payment source | Source: Ambulatory Visit | Attending: Cardiology | Admitting: Cardiology

## 2022-12-25 DIAGNOSIS — I213 ST elevation (STEMI) myocardial infarction of unspecified site: Secondary | ICD-10-CM | POA: Diagnosis not present

## 2022-12-29 ENCOUNTER — Encounter (HOSPITAL_COMMUNITY)
Admission: RE | Admit: 2022-12-29 | Discharge: 2022-12-29 | Disposition: A | Discharge status: Home / Self Care | Payer: No Typology Code available for payment source | Source: Ambulatory Visit | Attending: Cardiology

## 2022-12-29 DIAGNOSIS — I213 ST elevation (STEMI) myocardial infarction of unspecified site: Secondary | ICD-10-CM | POA: Diagnosis not present

## 2022-12-29 NOTE — Progress Notes (Signed)
CARDIAC REHAB PHASE 2  Reviewed home exercise with pt today. Pt is tolerating exercise well. Pt will continue to exercise on his own by doing exercise videos with his wife for 30-45 minutes per session 2 days a week in addition to the 3 days in [facility]. Advised pt on THRR, RPE scale, hydration and temperature/humidity precautions. Reinforced NTG use, S/S to stop exercise and when to call MD vs 911. Encouraged warm up cool down and stretches with exercise sessions. Pt verbalized understanding, all questions were answered and pt was given a copy to take home.    Harrie Jeans ACSM-CEP 12/29/2022 8:41 AM

## 2023-01-01 ENCOUNTER — Encounter (HOSPITAL_COMMUNITY)
Admission: RE | Admit: 2023-01-01 | Discharge: 2023-01-01 | Disposition: A | Discharge status: Home / Self Care | Payer: No Typology Code available for payment source | Source: Ambulatory Visit | Attending: Cardiology

## 2023-01-01 DIAGNOSIS — I213 ST elevation (STEMI) myocardial infarction of unspecified site: Secondary | ICD-10-CM

## 2023-01-05 ENCOUNTER — Encounter (HOSPITAL_COMMUNITY)
Admission: RE | Admit: 2023-01-05 | Discharge: 2023-01-05 | Disposition: A | Discharge status: Home / Self Care | Payer: Medicaid Other | Source: Ambulatory Visit | Attending: Cardiology | Admitting: Cardiology

## 2023-01-05 DIAGNOSIS — I252 Old myocardial infarction: Secondary | ICD-10-CM | POA: Insufficient documentation

## 2023-01-05 DIAGNOSIS — Z5189 Encounter for other specified aftercare: Secondary | ICD-10-CM | POA: Diagnosis present

## 2023-01-05 DIAGNOSIS — I213 ST elevation (STEMI) myocardial infarction of unspecified site: Secondary | ICD-10-CM

## 2023-01-08 ENCOUNTER — Encounter (HOSPITAL_COMMUNITY)
Admission: RE | Admit: 2023-01-08 | Discharge: 2023-01-08 | Disposition: A | Discharge status: Home / Self Care | Payer: No Typology Code available for payment source | Source: Ambulatory Visit | Attending: Cardiology | Admitting: Cardiology

## 2023-01-08 DIAGNOSIS — Z5189 Encounter for other specified aftercare: Secondary | ICD-10-CM | POA: Diagnosis not present

## 2023-01-08 DIAGNOSIS — I213 ST elevation (STEMI) myocardial infarction of unspecified site: Secondary | ICD-10-CM

## 2023-01-08 NOTE — Progress Notes (Signed)
 Cardiac Individual Treatment Plan  Patient Details  Name: Chase Walsh MRN: [id number] Date of Birth: [birth year] Referring Provider:   Flowsheet Row CARDIAC REHAB PHASE II ORIENTATION from 12/07/2022 in [facility]  Referring Provider Jhonny Nanas, MD       Initial Encounter Date:  Flowsheet Row CARDIAC REHAB PHASE II ORIENTATION from 12/07/2022 in [facility]  Date 12/07/22       Visit Diagnosis: 10/01/22 ST elevation myocardial infarction (STEMI)  Patient's Home Medications on Admission:  Current Outpatient Medications:    Ascorbic Acid  (VITAMIN C ) 1000 MG tablet, Take 1,000 mg by mouth every other day., Disp: , Rfl:    aspirin  81 MG chewable tablet, Chew 1 tablet (81 mg total) by mouth daily., Disp: 90 tablet, Rfl: 2   atorvastatin  (LIPITOR) 80 MG tablet, Take 1 tablet (80 mg total) by mouth daily., Disp: 90 tablet, Rfl: 2   dapagliflozin  propanediol (FARXIGA ) 10 MG TABS tablet, Take by mouth daily., Disp: , Rfl:    fluocinonide (LIDEX) 0.05 % external solution, Apply 1 application  topically as needed (breakout)., Disp: , Rfl:    magnesium  oxide (MAG-OX) 400 (240 Mg) MG tablet, Take 400 mg by mouth daily., Disp: , Rfl:    metoprolol  succinate (TOPROL -XL) 25 MG 24 hr tablet, Take 1 tablet (25 mg total) by mouth daily., Disp: 90 tablet, Rfl: 2   nitroGLYCERIN (NITROSTAT) 0.4 MG SL tablet, Place 0.4 mg under the tongue every 5 (five) minutes as needed for chest pain. (Patient not taking: Reported on 11/17/2022), Disp: , Rfl:    ticagrelor  (BRILINTA ) 90 MG TABS tablet, Take 1 tablet (90 mg total) by mouth 2 (two) times daily., Disp: 60 tablet, Rfl: 2  Past Medical History: Past Medical History:  Diagnosis Date   Allergy    Class 1 obesity due to excess calories with serious comorbidity and body mass index (BMI) of 33.0 to 33.9 in adult 07/08/2018    Diverticulosis    Fall 11/21/2018   Gout    Hematochezia 04/21/2016   History of COVID-19 02/05/2019   Hypertension    Lower GI bleed    Myocardial infarction (HCC) 10/01/22   Vitiligo     Tobacco Use: Social History   Tobacco Use  Smoking Status Never  Smokeless Tobacco Never    Labs: Review Flowsheet  More data exists      Latest Ref Rng & Units 05/12/2020 01/26/2021 05/12/2021 02/28/2022 08/29/2022  Labs for ITP Cardiac and Pulmonary Rehab  Cholestrol 100 - 199 mg/dL - - 729  820  825   LDL (calc) 0 - 99 mg/dL - - 812  888  888   HDL-C >39 mg/dL - - 30  31  27    Trlycerides 0 - 149 mg/dL - - 730  789  796   Hemoglobin A1c 4.8 - 5.6 % 5.6  5.6  - - 5.4     Capillary Blood Glucose: No results found for: GLUCAP   Exercise Target Goals: Exercise Program Goal: Individual exercise prescription set using results from initial 6 min walk test and THRR while considering  patient's activity barriers and safety.   Exercise Prescription Goal: Initial exercise prescription builds to 30-45 minutes a day of aerobic activity, 2-3 days per week.  Home exercise guidelines will be given to patient during program as part of exercise prescription that the participant will acknowledge.  Activity Barriers & Risk  Stratification:  Activity Barriers & Cardiac Risk Stratification - 12/07/22 0823       Activity Barriers & Cardiac Risk Stratification   Activity Barriers Balance Concerns;Shortness of Breath;Joint Problems    Cardiac Risk Stratification High   <5 METs on            6 Minute Walk:  6 Minute Walk     Row Name 12/07/22 0957         6 Minute Walk   Phase Initial     Distance 1440 feet     Walk Time 6 minutes     # of Rest Breaks 0     MPH 2.73     METS 3.74     RPE 9     Perceived Dyspnea  0     VO2 Peak 13.07     Symptoms No     Resting HR 70 bpm     Resting BP 126/88     Resting Oxygen Saturation  98 %     Exercise Oxygen Saturation  during 6 min walk  98 %     Max Ex. HR 89 bpm     Max Ex. BP 132/82     2 Minute Post BP 122/84              Oxygen Initial Assessment:   Oxygen Re-Evaluation:   Oxygen Discharge (Final Oxygen Re-Evaluation):   Initial Exercise Prescription:  Initial Exercise Prescription - 12/07/22 0900       Date of Initial Exercise RX and Referring Provider   Date 12/07/22    Referring Provider Jhonny Nanas, MD    Expected Discharge Date 02/28/23      Recumbant Bike   Level 1    RPM 60    Watts 30    Minutes 15    METs 3      Recumbant Elliptical   Level 1    RPM 55    Watts 70    Minutes 15    METs 3      Prescription Details   Frequency (times per week) 3    Duration Progress to 30 minutes of continuous aerobic without signs/symptoms of physical distress      Intensity   THRR 40-80% of Max Heartrate 67-134    Ratings of Perceived Exertion 11-13    Perceived Dyspnea 0-4      Progression   Progression Continue progressive overload as per policy without signs/symptoms or physical distress.      Resistance Training   Training Prescription Yes    Weight 4    Reps 10-15             Perform Capillary Blood Glucose checks as needed.  Exercise Prescription Changes:   Exercise Prescription Changes     Row Name 12/11/22 [phone] 12/29/22 0835           Response to Exercise   Blood Pressure (Admit) 140/88 126/80      Blood Pressure (Exercise) 140/84 154/82      Blood Pressure (Exit) 120/64 120/82      Heart Rate (Admit) 63 bpm 59 bpm      Heart Rate (Exercise) 91 bpm 112 bpm      Heart Rate (Exit) 69 bpm 68 bpm      Rating of Perceived Exertion (Exercise) 10.5 11.5      Symptoms none none      Comments Pt first day in the [employer] program Reviewed MET's, goals  and home ExRx      Duration Progress to 30 minutes of  aerobic without signs/symptoms of physical distress Progress to 30 minutes of  aerobic without signs/symptoms of physical distress      Intensity THRR  unchanged THRR unchanged        Progression   Progression Continue to progress workloads to maintain intensity without signs/symptoms of physical distress. Continue to progress workloads to maintain intensity without signs/symptoms of physical distress.      Average METs 2.5 3.4        Resistance Training   Training Prescription Yes Yes      Weight 4 5      Reps 10-15 10-15      Time 10 Minutes 10 Minutes        Recumbant Bike   Level 1 2      RPM 66 80      Watts 17 23      Minutes 15 15      METs 2 2.3        Recumbant Elliptical   Level 1 2      RPM 58 78      Watts 76 113      Minutes 15 15      METs 3 4.5        Home Exercise Plan   Plans to continue exercise at -- Home (comment)      Frequency -- Add 2 additional days to program exercise sessions.      Initial Home Exercises Provided -- 12/29/22               Exercise Comments:   Exercise Comments     Row Name 12/11/22 0825 12/29/22 0840         Exercise Comments Pt first day in the [facility] program. Pt tolerated exercise well with an average MET level of 2.5. Pt is learning his THRR, RPE and ExRx. Off to a great start Reviewed MET's, goals and home ExRx. Pt tolerated exercise well with an average MET level of 3.4. Ptis doing very well and progressing MET's. Pt feels good about his goals and has made new diet changes and is increasing strength, he does feel his stamina is limitied by medication side effects, but overall feels good with his progress. He is going to start exercising with his wife by doing a video home workout 2 days for 30-45 mins per session.               Exercise Goals and Review:   Exercise Goals     Row Name 12/07/22 0804             Exercise Goals   Increase Physical Activity Yes       Intervention Provide advice, education, support and counseling about physical activity/exercise needs.;Develop an individualized exercise prescription for aerobic and resistive training  based on initial evaluation findings, risk stratification, comorbidities and participant's personal goals.       Expected Outcomes Short Term: Attend rehab on a regular basis to increase amount of physical activity.;Long Term: Add in home exercise to make exercise part of routine and to increase amount of physical activity.;Long Term: Exercising regularly at least 3-5 days a week.       Increase Strength and Stamina Yes       Intervention Provide advice, education, support and counseling about physical activity/exercise needs.;Develop an individualized exercise prescription for aerobic and resistive training based on initial evaluation findings, risk  stratification, comorbidities and participant's personal goals.       Expected Outcomes Short Term: Increase workloads from initial exercise prescription for resistance, speed, and METs.;Short Term: Perform resistance training exercises routinely during rehab and add in resistance training at home;Long Term: Improve cardiorespiratory fitness, muscular endurance and strength as measured by increased METs and functional capacity ( )       Able to understand and use rate of perceived exertion (RPE) scale Yes       Intervention Provide education and explanation on how to use RPE scale       Expected Outcomes Long Term:  Able to use RPE to guide intensity level when exercising independently;Short Term: Able to use RPE daily in rehab to express subjective intensity level       Knowledge and understanding of Target Heart Rate Range (THRR) Yes       Intervention Provide education and explanation of THRR including how the numbers were predicted and where they are located for reference       Expected Outcomes Short Term: Able to state/look up THRR;Short Term: Able to use daily as guideline for intensity in rehab;Long Term: Able to use THRR to govern intensity when exercising independently       Understanding of Exercise Prescription Yes       Intervention Provide  education, explanation, and written materials on patient's individual exercise prescription       Expected Outcomes Short Term: Able to explain program exercise prescription;Long Term: Able to explain home exercise prescription to exercise independently                Exercise Goals Re-Evaluation :  Exercise Goals Re-Evaluation     Row Name 12/11/22 0826 12/29/22 0837           Exercise Goal Re-Evaluation   Exercise Goals Review Increase Physical Activity;Understanding of Exercise Prescription;Increase Strength and Stamina;Knowledge and understanding of Target Heart Rate Range (THRR);Able to understand and use rate of perceived exertion (RPE) scale Increase Physical Activity;Understanding of Exercise Prescription;Increase Strength and Stamina;Knowledge and understanding of Target Heart Rate Range (THRR);Able to understand and use rate of perceived exertion (RPE) scale      Comments Pt first day in the [facility] program. Pt tolerated exercise well with an average MET level of 2.5. Pt is learning his THRR, RPE and ExRx. Off to a great start Reviewed MET's, goals and home ExRx. Pt tolerated exercise well with an average MET level of 3.4. Ptis doing very well and progressing MET's. Pt feels good about his goals and has made new diet changes and is increasing strength, he does feel his stamina is limitied by medication side effects, but overall feels good with his progress. He is going to start exercising with his wife by doing a video home workout 2 days for 30-45 mins per session.      Expected Outcomes Will continue to monitor pt and progress workloads as tolerated without sign or symptom Will continue to monitor pt and progress workloads as tolerated without sign or symptom               Discharge Exercise Prescription (Final Exercise Prescription Changes):  Exercise Prescription Changes - 12/29/22 0835       Response to Exercise   Blood Pressure (Admit) 126/80    Blood Pressure  (Exercise) 154/82    Blood Pressure (Exit) 120/82    Heart Rate (Admit) 59 bpm    Heart Rate (Exercise) 112 bpm    Heart  Rate (Exit) 68 bpm    Rating of Perceived Exertion (Exercise) 11.5    Symptoms none    Comments Reviewed MET's, goals and home ExRx    Duration Progress to 30 minutes of  aerobic without signs/symptoms of physical distress    Intensity THRR unchanged      Progression   Progression Continue to progress workloads to maintain intensity without signs/symptoms of physical distress.    Average METs 3.4      Resistance Training   Training Prescription Yes    Weight 5    Reps 10-15    Time 10 Minutes      Recumbant Bike   Level 2    RPM 80    Watts 23    Minutes 15    METs 2.3      Recumbant Elliptical   Level 2    RPM 78    Watts 113    Minutes 15    METs 4.5      Home Exercise Plan   Plans to continue exercise at Home (comment)    Frequency Add 2 additional days to program exercise sessions.    Initial Home Exercises Provided 12/29/22             Nutrition:  Target Goals: Understanding of nutrition guidelines, daily intake of sodium 1500mg , cholesterol 200mg , calories 30% from fat and 7% or less from saturated fats, daily to have 5 or more servings of fruits and vegetables.  Biometrics:  Pre Biometrics - 12/07/22 0801       Pre Biometrics   Waist Circumference 48 inches    Hip Circumference 45.5 inches    Waist to Hip Ratio 1.05 %    Triceps Skinfold 36 mm    % Body Fat 36 %    Grip Strength 38 kg    Flexibility 13.5 in    Single Leg Stand 12.2 seconds              Nutrition Therapy Plan and Nutrition Goals:   Nutrition Assessments:  MEDIFICTS Score Key: >=70 Need to make dietary changes  40-70 Heart Healthy Diet <= 40 Therapeutic Level Cholesterol Diet   Flowsheet Row CARDIAC REHAB PHASE II EXERCISE from 12/15/2022 in [facility]  Picture Your Plate Total Score  on Admission 70      Picture Your Plate Scores: <59 Unhealthy dietary pattern with much room for improvement. 41-50 Dietary pattern unlikely to meet recommendations for good health and room for improvement. 51-60 More healthful dietary pattern, with some room for improvement.  >60 Healthy dietary pattern, although there may be some specific behaviors that could be improved.    Nutrition Goals Re-Evaluation:   Nutrition Goals Re-Evaluation:   Nutrition Goals Discharge (Final Nutrition Goals Re-Evaluation):   Psychosocial: Target Goals: Acknowledge presence or absence of significant depression and/or stress, maximize coping skills, provide positive support system. Participant is able to verbalize types and ability to use techniques and skills needed for reducing stress and depression.  Initial Review & Psychosocial Screening:  Initial Psych Review & Screening - 12/07/22 0823       Initial Review   Current issues with Current Depression;Current Stress Concerns    Source of Stress Concerns Chronic Illness;Family;Financial;Occupation;Unable to participate in former interests or hobbies    Comments Chase Walsh shared that he has had some feelings of anxiety/depression/stress since his MI. He says he has had to make a lot of changes which has  been overwhelming at times, and overall just feels fatigue. He denies any need for additional resources at this time.      Family Dynamics   Good Support System? Yes   Selig has his wife for support     Barriers   Psychosocial barriers to participate in program The patient should benefit from training in stress management and relaxation.      Screening Interventions   Interventions To provide support and resources with identified psychosocial needs;Provide feedback about the scores to participant;Encouraged to exercise    Expected Outcomes Long Term Goal: Stressors or current issues are controlled or eliminated.;Short Term goal: Identification and  review with participant of any Quality of Life or Depression concerns found by scoring the questionnaire.;Long Term goal: The participant improves quality of Life and PHQ9 Scores as seen by post scores and/or verbalization of changes;Short Term goal: Utilizing psychosocial counselor, staff and physician to assist with identification of specific Stressors or current issues interfering with healing process. Setting desired goal for each stressor or current issue identified.             Quality of Life Scores:  Quality of Life - 12/07/22 0846       Quality of Life   Select Quality of Life      Quality of Life Scores   Health/Function Pre 15.43 %    Socioeconomic Pre 18.69 %    Psych/Spiritual Pre 22.36 %    Family Pre 21.1 %    GLOBAL Pre 18.37 %            Scores of 19 and below usually indicate a poorer quality of life in these areas.  A difference of  2-3 points is a clinically meaningful difference.  A difference of 2-3 points in the total score of the Quality of Life Index has been associated with significant improvement in overall quality of life, self-image, physical symptoms, and general health in studies assessing change in quality of life.  PHQ-9: Review Flowsheet  More data exists      12/07/2022 11/09/2022 05/01/2022 09/15/2021 05/12/2021  Depression screen PHQ 2/9  Decreased Interest 1 3 0 0 0  Down, Depressed, Hopeless 1 1 0 0 0  PHQ - 2 Score 2 4 0 0 0  Altered sleeping 2 2 0 - -  Tired, decreased energy 2 3 0 - -  Change in appetite 0 0 0 - -  Feeling bad or failure about yourself  0 1 0 - -  Trouble concentrating 0 2 0 - -  Moving slowly or fidgety/restless 0 2 0 - -  Suicidal thoughts 0 0 0 - -  PHQ-9 Score 6 14 0 - -  Difficult doing work/chores Somewhat difficult Extremely dIfficult Not difficult at all - -   Interpretation of Total Score  Total Score Depression Severity:  1-4 = Minimal depression, 5-9 = Mild depression, 10-14 = Moderate depression, 15-19  = Moderately severe depression, 20-27 = Severe depression   Psychosocial Evaluation and Intervention:   Psychosocial Re-Evaluation:  Psychosocial Re-Evaluation     Row Name 12/11/22 1801 01/04/23 1115           Psychosocial Re-Evaluation   Current issues with Current Stress Concerns;Current Depression History of Depression;Current Stress Concerns      Comments Chase Walsh did not voice any increased concerns or stresssors on his first day of exercise. Will review PHq2-9 amd quality of life questionnaire in the upcoming week PHQ2-9 and quality of life questionnaire reviewed  by Theadora Byes RN M[facility] on 12/13/22. Chase Walsh shared he is not depressed and has the support he needs from wife, friends and family in [place] , he shared he does not need counseling services. The current areas that impact his QOL are Health and Function primarily lacking strength which he is now focusing on with exercise at cardiac rehab. In addition socioeconomic with facing financial challenges especially with out of state medical bills and trying to cover general household bills. He shared he is remaining optimistic as he has strong faith in God and support from family and friends.      Expected Outcomes Chase Walsh will have controlled or decreased depression upon completion of cardiac rehab. Chase Walsh will have decreased or controlled stress upon completion of cardiac rehab      Interventions Stress management education;Encouraged to attend Cardiac Rehabilitation for the exercise;Relaxation education Stress management education;Encouraged to attend Cardiac Rehabilitation for the exercise;Relaxation education      Continue Psychosocial Services  Follow up required by staff No Follow up required        Initial Review   Source of Stress Concerns Chronic Illness;Unable to participate in former interests or hobbies;Financial;Family;Unable to perform yard/household activities Chronic Illness;Financial      Comments Will continue to monitor  and offer support as needed. Will continue to monitor and offer support as needed               Psychosocial Discharge (Final Psychosocial Re-Evaluation):  Psychosocial Re-Evaluation - 01/04/23 1115       Psychosocial Re-Evaluation   Current issues with History of Depression;Current Stress Concerns    Comments PHQ2-9 and quality of life questionnaire reviewed by Theadora Byes RN [facility] on 12/13/22. Chase Walsh shared he is not depressed and has the support he needs from wife, friends and family in [place] , he shared he does not need counseling services. The current areas that impact his QOL are Health and Function primarily lacking strength which he is now focusing on with exercise at cardiac rehab. In addition socioeconomic with facing financial challenges especially with out of state medical bills and trying to cover general household bills. He shared he is remaining optimistic as he has strong faith in God and support from family and friends.    Expected Outcomes Chase Walsh will have decreased or controlled stress upon completion of cardiac rehab    Interventions Stress management education;Encouraged to attend Cardiac Rehabilitation for the exercise;Relaxation education    Continue Psychosocial Services  No Follow up required      Initial Review   Source of Stress Concerns Chronic Illness;Financial    Comments Will continue to monitor and offer support as needed             Vocational Rehabilitation: Provide vocational rehab assistance to qualifying candidates.   Vocational Rehab Evaluation & Intervention:  Vocational Rehab - 12/07/22 9161       Initial Vocational Rehab Evaluation & Intervention   Assessment shows need for Vocational Rehabilitation No   Chase Walsh is hoping to return to work soon            Education: Education Goals: Education classes will be provided on a weekly basis, covering required topics. Participant will state understanding/return demonstration of topics  presented.    Education     Row Name 12/13/22 1100     Education   Cardiac Education Topics [facility]   Paediatric Nurse  Efficiency Cooking - Meals in a Snap   Instruction Review Code 1- Verbalizes Understanding   Class Start Time 0815   Class Stop Time 0851   Class Time Calculation (min) 36 min    Row Name 12/15/22 0900     Education   Cardiac Education Topics [facility]   Select Core Videos     Core Videos   Educator Dietitian   Select Nutrition   Nutrition Calorie Density   Instruction Review Code 1- Verbalizes Understanding   Class Start Time 0815   Class Stop Time 0903   Class Time Calculation (min) 48 min    Row Name 12/29/22 0900     Education   Cardiac Education Topics [facility]   Glass Blower/designer Nutrition   Nutrition Workshop Targeting Your Nutrition Priorities   Instruction Review Code 1- Verbalizes Understanding   Class Start Time 0815   Class Stop Time 0849   Class Time Calculation (min) 34 min    Row Name 01/08/23 0800     Education   Cardiac Education Topics [facility]   Psychologist, Forensic Exercise Education   Exercise Education Biomechanial Limitations   Instruction Review Code 1- Verbalizes Understanding   Class Start Time 0815            Core Videos: Exercise    Move It!  Clinical staff conducted group or individual video education with verbal and written material and guidebook.  Patient learns the recommended [facility] exercise program. Exercise with the goal of living a long, healthy life. Some of the health benefits of exercise include controlled diabetes, healthier blood pressure levels, improved cholesterol levels, improved heart and lung capacity, improved sleep, and better body composition. Everyone should speak with their doctor before starting or  changing an exercise routine.  Biomechanical Limitations Clinical staff conducted group or individual video education with verbal and written material and guidebook.  Patient learns how biomechanical limitations can impact exercise and how we can mitigate and possibly overcome limitations to have an impactful and balanced exercise routine.  Body Composition Clinical staff conducted group or individual video education with verbal and written material and guidebook.  Patient learns that body composition (ratio of muscle mass to fat mass) is a key component to assessing overall fitness, rather than body weight alone. Increased fat mass, especially visceral belly fat, can put us  at increased risk for metabolic syndrome, type 2 diabetes, heart disease, and even death. It is recommended to combine diet and exercise (cardiovascular and resistance training) to improve your body composition. Seek guidance from your physician and exercise physiologist before implementing an exercise routine.  Exercise Action Plan Clinical staff conducted group or individual video education with verbal and written material and guidebook.  Patient learns the recommended strategies to achieve and enjoy long-term exercise adherence, including variety, self-motivation, self-efficacy, and positive decision making. Benefits of exercise include fitness, good health, weight management, more energy, better sleep, less stress, and overall well-being.  Medical   Heart Disease Risk Reduction Clinical staff conducted group or individual video education with verbal and written material and guidebook.  Patient learns our heart is our most vital organ as it circulates oxygen, nutrients, white blood cells, and hormones throughout the entire body, and carries waste away. Data supports a plant-based eating plan like the Pritikin Program for its effectiveness in slowing progression of and reversing  heart disease. The video provides a number of  recommendations to address heart disease.   Metabolic Syndrome and Belly Fat  Clinical staff conducted group or individual video education with verbal and written material and guidebook.  Patient learns what metabolic syndrome is, how it leads to heart disease, and how one can reverse it and keep it from coming back. You have metabolic syndrome if you have 3 of the following 5 criteria: abdominal obesity, high blood pressure, high triglycerides, low HDL cholesterol, and high blood sugar.  Hypertension and Heart Disease Clinical staff conducted group or individual video education with verbal and written material and guidebook.  Patient learns that high blood pressure, or hypertension, is very common in the [place] . Hypertension is largely due to excessive salt intake, but other important risk factors include being overweight, physical inactivity, drinking too much alcohol, smoking, and not eating enough potassium from fruits and vegetables. High blood pressure is a leading risk factor for heart attack, stroke, congestive heart failure, dementia, kidney failure, and premature death. Long-term effects of excessive salt intake include stiffening of the arteries and thickening of heart muscle and organ damage. Recommendations include ways to reduce hypertension and the risk of heart disease.  Diseases of Our Time - Focusing on Diabetes Clinical staff conducted group or individual video education with verbal and written material and guidebook.  Patient learns why the best way to stop diseases of our time is prevention, through food and other lifestyle changes. Medicine (such as prescription pills and surgeries) is often only a Band-Aid on the problem, not a long-term solution. Most common diseases of our time include obesity, type 2 diabetes, hypertension, heart disease, and cancer. The Pritikin Program is recommended and has been proven to help reduce, reverse, and/or prevent the damaging effects of  metabolic syndrome.  Nutrition   Overview of the Pritikin Eating Plan  Clinical staff conducted group or individual video education with verbal and written material and guidebook.  Patient learns about the Pritikin Eating Plan for disease risk reduction. The Pritikin Eating Plan emphasizes a wide variety of unrefined, minimally-processed carbohydrates, like fruits, vegetables, whole grains, and legumes. Go, Caution, and Stop food choices are explained. Plant-based and lean animal proteins are emphasized. Rationale provided for low sodium intake for blood pressure control, low added sugars for blood sugar stabilization, and low added fats and oils for coronary artery disease risk reduction and weight management.  Calorie Density  Clinical staff conducted group or individual video education with verbal and written material and guidebook.  Patient learns about calorie density and how it impacts the Pritikin Eating Plan. Knowing the characteristics of the food you choose will help you decide whether those foods will lead to weight gain or weight loss, and whether you want to consume more or less of them. Weight loss is usually a side effect of the Pritikin Eating Plan because of its focus on low calorie-dense foods.  Label Reading  Clinical staff conducted group or individual video education with verbal and written material and guidebook.  Patient learns about the Pritikin recommended label reading guidelines and corresponding recommendations regarding calorie density, added sugars, sodium content, and whole grains.  Dining Out - Part 1  Clinical staff conducted group or individual video education with verbal and written material and guidebook.  Patient learns that restaurant meals can be sabotaging because they can be so high in calories, fat, sodium, and/or sugar. Patient learns recommended strategies on how to positively address this and avoid unhealthy  pitfalls.  Facts on Fats  Clinical staff  conducted group or individual video education with verbal and written material and guidebook.  Patient learns that lifestyle modifications can be just as effective, if not more so, as many medications for lowering your risk of heart disease. A Pritikin lifestyle can help to reduce your risk of inflammation and atherosclerosis (cholesterol build-up, or plaque, in the artery walls). Lifestyle interventions such as dietary choices and physical activity address the cause of atherosclerosis. A review of the types of fats and their impact on blood cholesterol levels, along with dietary recommendations to reduce fat intake is also included.  Nutrition Action Plan  Clinical staff conducted group or individual video education with verbal and written material and guidebook.  Patient learns how to incorporate Pritikin recommendations into their lifestyle. Recommendations include planning and keeping personal health goals in mind as an important part of their success.  Healthy Mind-Set    Healthy Minds, Bodies, Hearts  Clinical staff conducted group or individual video education with verbal and written material and guidebook.  Patient learns how to identify when they are stressed. Video will discuss the impact of that stress, as well as the many benefits of stress management. Patient will also be introduced to stress management techniques. The way we think, act, and feel has an impact on our hearts.  How Our Thoughts Can Heal Our Hearts  Clinical staff conducted group or individual video education with verbal and written material and guidebook.  Patient learns that negative thoughts can cause depression and anxiety. This can result in negative lifestyle behavior and serious health problems. Cognitive behavioral therapy is an effective method to help control our thoughts in order to change and improve our emotional outlook.  Additional Videos:  Exercise    Improving Performance  Clinical staff conducted group  or individual video education with verbal and written material and guidebook.  Patient learns to use a non-linear approach by alternating intensity levels and lengths of time spent exercising to help burn more calories and lose more body fat. Cardiovascular exercise helps improve heart health, metabolism, hormonal balance, blood sugar control, and recovery from fatigue. Resistance training improves strength, endurance, balance, coordination, reaction time, metabolism, and muscle mass. Flexibility exercise improves circulation, posture, and balance. Seek guidance from your physician and exercise physiologist before implementing an exercise routine and learn your capabilities and proper form for all exercise.  Introduction to Yoga  Clinical staff conducted group or individual video education with verbal and written material and guidebook.  Patient learns about yoga, a discipline of the coming together of mind, breath, and body. The benefits of yoga include improved flexibility, improved range of motion, better posture and core strength, increased lung function, weight loss, and positive self-image. Yoga's heart health benefits include lowered blood pressure, healthier heart rate, decreased cholesterol and triglyceride levels, improved immune function, and reduced stress. Seek guidance from your physician and exercise physiologist before implementing an exercise routine and learn your capabilities and proper form for all exercise.  Medical   Aging: Enhancing Your Quality of Life  Clinical staff conducted group or individual video education with verbal and written material and guidebook.  Patient learns key strategies and recommendations to stay in good physical health and enhance quality of life, such as prevention strategies, having an advocate, securing a Health Care Proxy and Power of Attorney, and keeping a list of medications and system for tracking them. It also discusses how to avoid risk for bone  loss.  Biology  of Weight Control  Clinical staff conducted group or individual video education with verbal and written material and guidebook.  Patient learns that weight gain occurs because we consume more calories than we burn (eating more, moving less). Even if your body weight is normal, you may have higher ratios of fat compared to muscle mass. Too much body fat puts you at increased risk for cardiovascular disease, heart attack, stroke, type 2 diabetes, and obesity-related cancers. In addition to exercise, following the Pritikin Eating Plan can help reduce your risk.  Decoding Lab Results  Clinical staff conducted group or individual video education with verbal and written material and guidebook.  Patient learns that lab test reflects one measurement whose values change over time and are influenced by many factors, including medication, stress, sleep, exercise, food, hydration, pre-existing medical conditions, and more. It is recommended to use the knowledge from this video to become more involved with your lab results and evaluate your numbers to speak with your doctor.   Diseases of Our Time - Overview  Clinical staff conducted group or individual video education with verbal and written material and guidebook.  Patient learns that according to the CDC, 50% to 70% of chronic diseases (such as obesity, type 2 diabetes, elevated lipids, hypertension, and heart disease) are avoidable through lifestyle improvements including healthier food choices, listening to satiety cues, and increased physical activity.  Sleep Disorders Clinical staff conducted group or individual video education with verbal and written material and guidebook.  Patient learns how good quality and duration of sleep are important to overall health and well-being. Patient also learns about sleep disorders and how they impact health along with recommendations to address them, including discussing with a physician.  Nutrition   Dining Out - Part 2 Clinical staff conducted group or individual video education with verbal and written material and guidebook.  Patient learns how to plan ahead and communicate in order to maximize their dining experience in a healthy and nutritious manner. Included are recommended food choices based on the type of restaurant the patient is visiting.   Fueling a Banker conducted group or individual video education with verbal and written material and guidebook.  There is a strong connection between our food choices and our health. Diseases like obesity and type 2 diabetes are very prevalent and are in large-part due to lifestyle choices. The Pritikin Eating Plan provides plenty of food and hunger-curbing satisfaction. It is easy to follow, affordable, and helps reduce health risks.  Menu Workshop  Clinical staff conducted group or individual video education with verbal and written material and guidebook.  Patient learns that restaurant meals can sabotage health goals because they are often packed with calories, fat, sodium, and sugar. Recommendations include strategies to plan ahead and to communicate with the manager, chef, or server to help order a healthier meal.  Planning Your Eating Strategy  Clinical staff conducted group or individual video education with verbal and written material and guidebook.  Patient learns about the Pritikin Eating Plan and its benefit of reducing the risk of disease. The Pritikin Eating Plan does not focus on calories. Instead, it emphasizes high-quality, nutrient-rich foods. By knowing the characteristics of the foods, we choose, we can determine their calorie density and make informed decisions.  Targeting Your Nutrition Priorities  Clinical staff conducted group or individual video education with verbal and written material and guidebook.  Patient learns that lifestyle habits have a tremendous impact on disease risk and progression. This  video provides eating and physical activity recommendations based on your personal health goals, such as reducing LDL cholesterol, losing weight, preventing or controlling type 2 diabetes, and reducing high blood pressure.  Vitamins and Minerals  Clinical staff conducted group or individual video education with verbal and written material and guidebook.  Patient learns different ways to obtain key vitamins and minerals, including through a recommended healthy diet. It is important to discuss all supplements you take with your doctor.   Healthy Mind-Set    Smoking Cessation  Clinical staff conducted group or individual video education with verbal and written material and guidebook.  Patient learns that cigarette smoking and tobacco addiction pose a serious health risk which affects millions of people. Stopping smoking will significantly reduce the risk of heart disease, lung disease, and many forms of cancer. Recommended strategies for quitting are covered, including working with your doctor to develop a successful plan.  Culinary   Becoming a Set Designer conducted group or individual video education with verbal and written material and guidebook.  Patient learns that cooking at home can be healthy, cost-effective, quick, and puts them in control. Keys to cooking healthy recipes will include looking at your recipe, assessing your equipment needs, planning ahead, making it simple, choosing cost-effective seasonal ingredients, and limiting the use of added fats, salts, and sugars.  Cooking - Breakfast and Snacks  Clinical staff conducted group or individual video education with verbal and written material and guidebook.  Patient learns how important breakfast is to satiety and nutrition through the entire day. Recommendations include key foods to eat during breakfast to help stabilize blood sugar levels and to prevent overeating at meals later in the day. Planning ahead is also a  key component.  Cooking - Educational Psychologist conducted group or individual video education with verbal and written material and guidebook.  Patient learns eating strategies to improve overall health, including an approach to cook more at home. Recommendations include thinking of animal protein as a side on your plate rather than center stage and focusing instead on lower calorie dense options like vegetables, fruits, whole grains, and plant-based proteins, such as beans. Making sauces in large quantities to freeze for later and leaving the skin on your vegetables are also recommended to maximize your experience.  Cooking - Healthy Salads and Dressing Clinical staff conducted group or individual video education with verbal and written material and guidebook.  Patient learns that vegetables, fruits, whole grains, and legumes are the foundations of the Pritikin Eating Plan. Recommendations include how to incorporate each of these in flavorful and healthy salads, and how to create homemade salad dressings. Proper handling of ingredients is also covered. Cooking - Soups and [employer] - Soups and Desserts Clinical staff conducted group or individual video education with verbal and written material and guidebook.  Patient learns that Pritikin soups and desserts make for easy, nutritious, and delicious snacks and meal components that are low in sodium, fat, sugar, and calorie density, while high in vitamins, minerals, and filling fiber. Recommendations include simple and healthy ideas for soups and desserts.   Overview     The Pritikin Solution Program Overview Clinical staff conducted group or individual video education with verbal and written material and guidebook.  Patient learns that the results of the Pritikin Program have been documented in more than 100 articles published in peer-reviewed journals, and the benefits include reducing risk factors for (and, in some cases, even  reversing) high cholesterol, high blood pressure, type 2 diabetes, obesity, and more! An overview of the three key pillars of the Pritikin Program will be covered: eating well, doing regular exercise, and having a healthy mind-set.  WORKSHOPS  Exercise: Exercise Basics: Building Your Action Plan Clinical staff led group instruction and group discussion with PowerPoint presentation and patient guidebook. To enhance the learning environment the use of posters, models and videos may be added. At the conclusion of this workshop, patients will comprehend the difference between physical activity and exercise, as well as the benefits of incorporating both, into their routine. Patients will understand the FITT (Frequency, Intensity, Time, and Type) principle and how to use it to build an exercise action plan. In addition, safety concerns and other considerations for exercise and cardiac rehab will be addressed by the presenter. The purpose of this lesson is to promote a comprehensive and effective weekly exercise routine in order to improve patients' overall level of fitness.   Managing Heart Disease: Your Path to a Healthier Heart Clinical staff led group instruction and group discussion with PowerPoint presentation and patient guidebook. To enhance the learning environment the use of posters, models and videos may be added.At the conclusion of this workshop, patients will understand the anatomy and physiology of the heart. Additionally, they will understand how Pritikin's three pillars impact the risk factors, the progression, and the management of heart disease.  The purpose of this lesson is to provide a high-level overview of the heart, heart disease, and how the Pritikin lifestyle positively impacts risk factors.  Exercise Biomechanics Clinical staff led group instruction and group discussion with PowerPoint presentation and patient guidebook. To enhance the learning environment the use of  posters, models and videos may be added. Patients will learn how the structural parts of their bodies function and how these functions impact their daily activities, movement, and exercise. Patients will learn how to promote a neutral spine, learn how to manage pain, and identify ways to improve their physical movement in order to promote healthy living. The purpose of this lesson is to expose patients to common physical limitations that impact physical activity. Participants will learn practical ways to adapt and manage aches and pains, and to minimize their effect on regular exercise. Patients will learn how to maintain good posture while sitting, walking, and lifting.  Balance Training and Fall Prevention  Clinical staff led group instruction and group discussion with PowerPoint presentation and patient guidebook. To enhance the learning environment the use of posters, models and videos may be added. At the conclusion of this workshop, patients will understand the importance of their sensorimotor skills (vision, proprioception, and the vestibular system) in maintaining their ability to balance as they age. Patients will apply a variety of balancing exercises that are appropriate for their current level of function. Patients will understand the common causes for poor balance, possible solutions to these problems, and ways to modify their physical environment in order to minimize their fall risk. The purpose of this lesson is to teach patients about the importance of maintaining balance as they age and ways to minimize their risk of falling.  WORKSHOPS   Nutrition:  Fueling a Ship Broker led group instruction and group discussion with PowerPoint presentation and patient guidebook. To enhance the learning environment the use of posters, models and videos may be added. Patients will review the foundational principles of the Pritikin Eating Plan and understand what constitutes a  serving size in each of the food  groups. Patients will also learn Pritikin-friendly foods that are better choices when away from home and review make-ahead meal and snack options. Calorie density will be reviewed and applied to three nutrition priorities: weight maintenance, weight loss, and weight gain. The purpose of this lesson is to reinforce (in a group setting) the key concepts around what patients are recommended to eat and how to apply these guidelines when away from home by planning and selecting Pritikin-friendly options. Patients will understand how calorie density may be adjusted for different weight management goals.  Mindful Eating  Clinical staff led group instruction and group discussion with PowerPoint presentation and patient guidebook. To enhance the learning environment the use of posters, models and videos may be added. Patients will briefly review the concepts of the Pritikin Eating Plan and the importance of low-calorie dense foods. The concept of mindful eating will be introduced as well as the importance of paying attention to internal hunger signals. Triggers for non-hunger eating and techniques for dealing with triggers will be explored. The purpose of this lesson is to provide patients with the opportunity to review the basic principles of the Pritikin Eating Plan, discuss the value of eating mindfully and how to measure internal cues of hunger and fullness using the Hunger Scale. Patients will also discuss reasons for non-hunger eating and learn strategies to use for controlling emotional eating.  Targeting Your Nutrition Priorities Clinical staff led group instruction and group discussion with PowerPoint presentation and patient guidebook. To enhance the learning environment the use of posters, models and videos may be added. Patients will learn how to determine their genetic susceptibility to disease by reviewing their family history. Patients will gain insight into the  importance of diet as part of an overall healthy lifestyle in mitigating the impact of genetics and other environmental insults. The purpose of this lesson is to provide patients with the opportunity to assess their personal nutrition priorities by looking at their family history, their own health history and current risk factors. Patients will also be able to discuss ways of prioritizing and modifying the Pritikin Eating Plan for their highest risk areas  Menu  Clinical staff led group instruction and group discussion with PowerPoint presentation and patient guidebook. To enhance the learning environment the use of posters, models and videos may be added. Using menus brought in from [employer], or printed from [employer], patients will apply the Pritikin dining out guidelines that were presented in the [employer] video. Patients will also be able to practice these guidelines in a variety of provided scenarios. The purpose of this lesson is to provide patients with the opportunity to practice hands-on learning of the Pritikin Dining Out guidelines with actual menus and practice scenarios.  Label Reading Clinical staff led group instruction and group discussion with PowerPoint presentation and patient guidebook. To enhance the learning environment the use of posters, models and videos may be added. Patients will review and discuss the Pritikin label reading guidelines presented in Pritikin's Label Reading Educational series video. Using fool labels brought in from local grocery stores and markets, patients will apply the label reading guidelines and determine if the packaged food meet the Pritikin guidelines. The purpose of this lesson is to provide patients with the opportunity to review, discuss, and practice hands-on learning of the Pritikin Label Reading guidelines with actual packaged food labels. Cooking School  Pritikin's [employer] are designed to teach  patients ways to prepare quick, simple, and affordable recipes at  home. The importance of nutrition's role in chronic disease risk reduction is reflected in its emphasis in the overall Pritikin program. By learning how to prepare essential core Pritikin Eating Plan recipes, patients will increase control over what they eat; be able to customize the flavor of foods without the use of added salt, sugar, or fat; and improve the quality of the food they consume. By learning a set of core recipes which are easily assembled, quickly prepared, and affordable, patients are more likely to prepare more healthy foods at home. These workshops focus on convenient breakfasts, simple entres, side dishes, and desserts which can be prepared with minimal effort and are consistent with nutrition recommendations for cardiovascular risk reduction. Cooking [employer] are taught by a armed forces logistics/support/administrative officer (RD) who has been trained by the [employer]. The chef or RD has a clear understanding of the importance of minimizing - if not completely eliminating - added fat, sugar, and sodium in recipes. Throughout the series of Cooking School Workshop sessions, patients will learn about healthy ingredients and efficient methods of cooking to build confidence in their capability to prepare    Cooking School weekly topics:  Adding Flavor- Sodium-Free  Fast and Healthy Breakfasts  Powerhouse Plant-Based Proteins  Satisfying Salads and Dressings  Simple Sides and Sauces  International Cuisine-Spotlight on the [employer] Zones  Delicious Desserts  Savory Soups  [employer] - Meals in a Astronomer Appetizers and Snacks  Comforting Weekend Breakfasts  One-Pot Wonders   Fast Evening Meals  Landscape Architect Your [facility] Plate  WORKSHOPS   Healthy Mindset (Psychosocial):  Focused Goals, Sustainable Changes Clinical staff led group instruction and group discussion with PowerPoint  presentation and patient guidebook. To enhance the learning environment the use of posters, models and videos may be added. Patients will be able to apply effective goal setting strategies to establish at least one personal goal, and then take consistent, meaningful action toward that goal. They will learn to identify common barriers to achieving personal goals and develop strategies to overcome them. Patients will also gain an understanding of how our mind-set can impact our ability to achieve goals and the importance of cultivating a positive and growth-oriented mind-set. The purpose of this lesson is to provide patients with a deeper understanding of how to set and achieve personal goals, as well as the tools and strategies needed to overcome common obstacles which may arise along the way.  From Head to Heart: The Power of a Healthy Outlook  Clinical staff led group instruction and group discussion with PowerPoint presentation and patient guidebook. To enhance the learning environment the use of posters, models and videos may be added. Patients will be able to recognize and describe the impact of emotions and mood on physical health. They will discover the importance of self-care and explore self-care practices which may work for them. Patients will also learn how to utilize the 4 C's to cultivate a healthier outlook and better manage stress and challenges. The purpose of this lesson is to demonstrate to patients how a healthy outlook is an essential part of maintaining good health, especially as they continue their cardiac rehab journey.  Healthy Sleep for a Healthy Heart Clinical staff led group instruction and group discussion with PowerPoint presentation and patient guidebook. To enhance the learning environment the use of posters, models and videos may be added. At the conclusion of this workshop, patients will be able to demonstrate knowledge of the importance  of sleep to overall health, well-being,  and quality of life. They will understand the symptoms of, and treatments for, common sleep disorders. Patients will also be able to identify daytime and nighttime behaviors which impact sleep, and they will be able to apply these tools to help manage sleep-related challenges. The purpose of this lesson is to provide patients with a general overview of sleep and outline the importance of quality sleep. Patients will learn about a few of the most common sleep disorders. Patients will also be introduced to the concept of "sleep hygiene," and discover ways to self-manage certain sleeping problems through simple daily behavior changes. Finally, the workshop will motivate patients by clarifying the links between quality sleep and their goals of heart-healthy living.   Recognizing and Reducing Stress Clinical staff led group instruction and group discussion with PowerPoint presentation and patient guidebook. To enhance the learning environment the use of posters, models and videos may be added. At the conclusion of this workshop, patients will be able to understand the types of stress reactions, differentiate between acute and chronic stress, and recognize the impact that chronic stress has on their health. They will also be able to apply different coping mechanisms, such as reframing negative self-talk. Patients will have the opportunity to practice a variety of stress management techniques, such as deep abdominal breathing, progressive muscle relaxation, and/or guided imagery.  The purpose of this lesson is to educate patients on the role of stress in their lives and to provide healthy techniques for coping with it.  Learning Barriers/Preferences:  Learning Barriers/Preferences - 12/07/22 0825       Learning Barriers/Preferences   Learning Barriers Sight   wears glasses   Learning Preferences Audio;Computer/Internet;Group Instruction;Individual Instruction;Skilled Demonstration;Verbal Instruction;Video              Education Topics:  Knowledge Questionnaire Score:  Knowledge Questionnaire Score - 12/07/22 0838       Knowledge Questionnaire Score   Pre Score 21/24             Core Components/Risk Factors/Patient Goals at Admission:  Personal Goals and Risk Factors at Admission - 12/07/22 0826       Core Components/Risk Factors/Patient Goals on Admission    Weight Management Yes;Obesity;Weight Loss    Intervention Weight Management: Develop a combined nutrition and exercise program designed to reach desired caloric intake, while maintaining appropriate intake of nutrient and fiber, sodium and fats, and appropriate energy expenditure required for the weight goal.;Weight Management: Provide education and appropriate resources to help participant work on and attain dietary goals.;Weight Management/Obesity: Establish reasonable short term and long term weight goals.;Obesity: Provide education and appropriate resources to help participant work on and attain dietary goals.    Expected Outcomes Short Term: Continue to assess and modify interventions until short term weight is achieved;Long Term: Adherence to nutrition and physical activity/exercise program aimed toward attainment of established weight goal;Weight Loss: Understanding of general recommendations for a balanced deficit meal plan, which promotes 1-2 lb weight loss per week and includes a negative energy balance of [phone] kcal/d;Understanding recommendations for meals to include 15-35% energy as protein, 25-35% energy from fat, 35-60% energy from carbohydrates, less than 200mg  of dietary cholesterol, 20-35 gm of total fiber daily;Understanding of distribution of calorie intake throughout the day with the consumption of 4-5 meals/snacks    Hypertension Yes    Intervention Provide education on lifestyle modifcations including regular physical activity/exercise, weight management, moderate sodium restriction and increased consumption of  fresh fruit, vegetables,  and low fat dairy, alcohol moderation, and smoking cessation.;Monitor prescription use compliance.    Expected Outcomes Short Term: Continued assessment and intervention until BP is < 140/68mm HG in hypertensive participants. < 130/69mm HG in hypertensive participants with diabetes, heart failure or chronic kidney disease.;Long Term: Maintenance of blood pressure at goal levels.    Lipids Yes    Intervention Provide education and support for participant on nutrition & aerobic/resistive exercise along with prescribed medications to achieve LDL 70mg , HDL >40mg .    Expected Outcomes Short Term: Participant states understanding of desired cholesterol values and is compliant with medications prescribed. Participant is following exercise prescription and nutrition guidelines.;Long Term: Cholesterol controlled with medications as prescribed, with individualized exercise RX and with personalized nutrition plan. Value goals: LDL < 70mg , HDL > 40 mg.    Stress Yes    Intervention Refer participants experiencing significant psychosocial distress to appropriate mental health specialists for further evaluation and treatment. When possible, include family members and significant others in education/counseling sessions.;Offer individual and/or small group education and counseling on adjustment to heart disease, stress management and health-related lifestyle change. Teach and support self-help strategies.    Expected Outcomes Short Term: Participant demonstrates changes in health-related behavior, relaxation and other stress management skills, ability to obtain effective social support, and compliance with psychotropic medications if prescribed.;Long Term: Emotional wellbeing is indicated by absence of clinically significant psychosocial distress or social isolation.             Core Components/Risk Factors/Patient Goals Review:   Goals and Risk Factor Review     Row Name 12/11/22 1805  01/04/23 1119           Core Components/Risk Factors/Patient Goals Review   Personal Goals Review Weight Management/Obesity;Stress;Hypertension;Lipids Weight Management/Obesity;Hypertension;Lipids;Stress      Review Chase Walsh started cardiac rehab on 12/11/22/ Chase Walsh did well with exercise. Vital signs were stable. Chase Walsh is off to a good start to exercise at cardiac rehab. Vital signs have been stable.      Expected Outcomes Chase Walsh will continue to participate in cardiac rehab for exercise, nutrtion and lifestyle modifications Chase Walsh will continue to participate in cardiac rehab for exercise, nutrition and lifestyle modifications               Core Components/Risk Factors/Patient Goals at Discharge (Final Review):   Goals and Risk Factor Review - 01/04/23 1119       Core Components/Risk Factors/Patient Goals Review   Personal Goals Review Weight Management/Obesity;Hypertension;Lipids;Stress    Review Chase Walsh is off to a good start to exercise at cardiac rehab. Vital signs have been stable.    Expected Outcomes Chase Walsh will continue to participate in cardiac rehab for exercise, nutrition and lifestyle modifications             ITP Comments:  ITP Comments     Row Name 12/07/22 0802 12/11/22 1800 01/04/23 1112       ITP Comments Wilbert Bihari, MD: Medical Director.  Introduction to the [facility] Education Program/Intensive Cardiac Rehab. Initial orientation packet reviewed with the patient. 30 Day ITP Review. Lyrik started cardiac rehab on 12/11/22. Rusell did well with exercise. 30 Day ITP Review. Ka has good attendance and participation in cardiac rehab. Javid is off to a good start to exercise.              Comments: See ITP comment.

## 2023-01-08 NOTE — Progress Notes (Deleted)
 Cardiac Individual Treatment Plan  Patient Details  Name: Chase Walsh MRN: [id number] Date of Birth: Aug 08, 1970 Referring Provider:   Flowsheet Row CARDIAC REHAB PHASE II ORIENTATION from 12/07/2022 in [facility]  Referring Provider Jhonny Nanas, MD       Initial Encounter Date:  Flowsheet Row CARDIAC REHAB PHASE II ORIENTATION from 12/07/2022 in [facility]  Date 12/07/22       Visit Diagnosis: 10/01/22 ST elevation myocardial infarction (STEMI)  Patient's Home Medications on Admission:  Current Outpatient Medications:    Ascorbic Acid  (VITAMIN C ) 1000 MG tablet, Take 1,000 mg by mouth every other day., Disp: , Rfl:    aspirin  81 MG chewable tablet, Chew 1 tablet (81 mg total) by mouth daily., Disp: 90 tablet, Rfl: 2   atorvastatin  (LIPITOR) 80 MG tablet, Take 1 tablet (80 mg total) by mouth daily., Disp: 90 tablet, Rfl: 2   dapagliflozin  propanediol (FARXIGA ) 10 MG TABS tablet, Take by mouth daily., Disp: , Rfl:    fluocinonide (LIDEX) 0.05 % external solution, Apply 1 application  topically as needed (breakout)., Disp: , Rfl:    magnesium  oxide (MAG-OX) 400 (240 Mg) MG tablet, Take 400 mg by mouth daily., Disp: , Rfl:    metoprolol  succinate (TOPROL -XL) 25 MG 24 hr tablet, Take 1 tablet (25 mg total) by mouth daily., Disp: 90 tablet, Rfl: 2   nitroGLYCERIN (NITROSTAT) 0.4 MG SL tablet, Place 0.4 mg under the tongue every 5 (five) minutes as needed for chest pain. (Patient not taking: Reported on 11/17/2022), Disp: , Rfl:    ticagrelor  (BRILINTA ) 90 MG TABS tablet, Take 1 tablet (90 mg total) by mouth 2 (two) times daily., Disp: 60 tablet, Rfl: 2  Past Medical History: Past Medical History:  Diagnosis Date   Allergy    Class 1 obesity due to excess calories with serious comorbidity and body mass index (BMI) of 33.0 to 33.9 in adult 07/08/2018    Diverticulosis    Fall 11/21/2018   Gout    Hematochezia 04/21/2016   History of COVID-19 02/05/2019   Hypertension    Lower GI bleed    Myocardial infarction (HCC) 10/01/22   Vitiligo     Tobacco Use: Social History   Tobacco Use  Smoking Status Never  Smokeless Tobacco Never    Labs: Review Flowsheet  More data exists      Latest Ref Rng & Units 05/12/2020 01/26/2021 05/12/2021 02/28/2022 08/29/2022  Labs for ITP Cardiac and Pulmonary Rehab  Cholestrol 100 - 199 mg/dL - - 729  820  825   LDL (calc) 0 - 99 mg/dL - - 812  888  888   HDL-C >39 mg/dL - - 30  31  27    Trlycerides 0 - 149 mg/dL - - 730  789  796   Hemoglobin A1c 4.8 - 5.6 % 5.6  5.6  - - 5.4     Capillary Blood Glucose: No results found for: GLUCAP   Exercise Target Goals: Exercise Program Goal: Individual exercise prescription set using results from initial 6 min walk test and THRR while considering  patient's activity barriers and safety.   Exercise Prescription Goal: Starting with aerobic activity 30 plus minutes a day, 3 days per week for initial exercise prescription. Provide home exercise prescription and guidelines that participant acknowledges understanding prior to discharge.  Activity Barriers & Risk Stratification:  Activity Barriers & Cardiac Risk  Stratification - 12/07/22 0823       Activity Barriers & Cardiac Risk Stratification   Activity Barriers Balance Concerns;Shortness of Breath;Joint Problems    Cardiac Risk Stratification High   <5 METs on            6 Minute Walk:  6 Minute Walk     Row Name 12/07/22 0957         6 Minute Walk   Phase Initial     Distance 1440 feet     Walk Time 6 minutes     # of Rest Breaks 0     MPH 2.73     METS 3.74     RPE 9     Perceived Dyspnea  0     VO2 Peak 13.07     Symptoms No     Resting HR 70 bpm     Resting BP 126/88     Resting Oxygen Saturation  98 %     Exercise Oxygen Saturation  during 6 min walk 98 %     Max Ex.  HR 89 bpm     Max Ex. BP 132/82     2 Minute Post BP 122/84              Oxygen Initial Assessment:   Oxygen Re-Evaluation:   Oxygen Discharge (Final Oxygen Re-Evaluation):   Initial Exercise Prescription:  Initial Exercise Prescription - 12/07/22 0900       Date of Initial Exercise RX and Referring Provider   Date 12/07/22    Referring Provider Jhonny Nanas, MD    Expected Discharge Date 02/28/23      Recumbant Bike   Level 1    RPM 60    Watts 30    Minutes 15    METs 3      Recumbant Elliptical   Level 1    RPM 55    Watts 70    Minutes 15    METs 3      Prescription Details   Frequency (times per week) 3    Duration Progress to 30 minutes of continuous aerobic without signs/symptoms of physical distress      Intensity   THRR 40-80% of Max Heartrate 67-134    Ratings of Perceived Exertion 11-13    Perceived Dyspnea 0-4      Progression   Progression Continue progressive overload as per policy without signs/symptoms or physical distress.      Resistance Training   Training Prescription Yes    Weight 4    Reps 10-15             Perform Capillary Blood Glucose checks as needed.  Exercise Prescription Changes:   Exercise Prescription Changes     Row Name 12/11/22 779 [id number] 1150 12/29/22 0835           Response to Exercise   Blood Pressure (Admit) 140/88 126/80      Blood Pressure (Exercise) 140/84 154/82      Blood Pressure (Exit) 120/64 120/82      Heart Rate (Admit) 63 bpm 59 bpm      Heart Rate (Exercise) 91 bpm 112 bpm      Heart Rate (Exit) 69 bpm 68 bpm      Rating of Perceived Exertion (Exercise) 10.5 11.5      Symptoms none none      Comments Pt first day in the [employer] program Reviewed MET's, goals and home ExRx  Duration Progress to 30 minutes of  aerobic without signs/symptoms of physical distress Progress to 30 minutes of  aerobic without signs/symptoms of physical distress      Intensity THRR unchanged THRR  unchanged        Progression   Progression Continue to progress workloads to maintain intensity without signs/symptoms of physical distress. Continue to progress workloads to maintain intensity without signs/symptoms of physical distress.      Average METs 2.5 3.4        Resistance Training   Training Prescription Yes Yes      Weight 4 5      Reps 10-15 10-15      Time 10 Minutes 10 Minutes        Recumbant Bike   Level 1 2      RPM 66 80      Watts 17 23      Minutes 15 15      METs 2 2.3        Recumbant Elliptical   Level 1 2      RPM 58 78      Watts 76 113      Minutes 15 15      METs 3 4.5        Home Exercise Plan   Plans to continue exercise at -- Home (comment)      Frequency -- Add 2 additional days to program exercise sessions.      Initial Home Exercises Provided -- 12/29/22               Exercise Comments:   Exercise Comments     Row Name 12/11/22 0825 12/29/22 0840         Exercise Comments Pt first day in the [facility] program. Pt tolerated exercise well with an average MET level of 2.5. Pt is learning his THRR, RPE and ExRx. Off to a great start Reviewed MET's, goals and home ExRx. Pt tolerated exercise well with an average MET level of 3.4. Ptis doing very well and progressing MET's. Pt feels good about his goals and has made new diet changes and is increasing strength, he does feel his stamina is limitied by medication side effects, but overall feels good with his progress. He is going to start exercising with his wife by doing a video home workout 2 days for 30-45 mins per session.               Exercise Goals and Review:   Exercise Goals     Row Name 12/07/22 0804             Exercise Goals   Increase Physical Activity Yes       Intervention Provide advice, education, support and counseling about physical activity/exercise needs.;Develop an individualized exercise prescription for aerobic and resistive training based on initial  evaluation findings, risk stratification, comorbidities and participant's personal goals.       Expected Outcomes Short Term: Attend rehab on a regular basis to increase amount of physical activity.;Long Term: Add in home exercise to make exercise part of routine and to increase amount of physical activity.;Long Term: Exercising regularly at least 3-5 days a week.       Increase Strength and Stamina Yes       Intervention Provide advice, education, support and counseling about physical activity/exercise needs.;Develop an individualized exercise prescription for aerobic and resistive training based on initial evaluation findings, risk stratification, comorbidities and participant's personal goals.  Expected Outcomes Short Term: Increase workloads from initial exercise prescription for resistance, speed, and METs.;Short Term: Perform resistance training exercises routinely during rehab and add in resistance training at home;Long Term: Improve cardiorespiratory fitness, muscular endurance and strength as measured by increased METs and functional capacity ( )       Able to understand and use rate of perceived exertion (RPE) scale Yes       Intervention Provide education and explanation on how to use RPE scale       Expected Outcomes Long Term:  Able to use RPE to guide intensity level when exercising independently;Short Term: Able to use RPE daily in rehab to express subjective intensity level       Knowledge and understanding of Target Heart Rate Range (THRR) Yes       Intervention Provide education and explanation of THRR including how the numbers were predicted and where they are located for reference       Expected Outcomes Short Term: Able to state/look up THRR;Short Term: Able to use daily as guideline for intensity in rehab;Long Term: Able to use THRR to govern intensity when exercising independently       Understanding of Exercise Prescription Yes       Intervention Provide education,  explanation, and written materials on patient's individual exercise prescription       Expected Outcomes Short Term: Able to explain program exercise prescription;Long Term: Able to explain home exercise prescription to exercise independently                Exercise Goals Re-Evaluation :  Exercise Goals Re-Evaluation     Row Name 12/11/22 0826 12/29/22 0837           Exercise Goal Re-Evaluation   Exercise Goals Review Increase Physical Activity;Understanding of Exercise Prescription;Increase Strength and Stamina;Knowledge and understanding of Target Heart Rate Range (THRR);Able to understand and use rate of perceived exertion (RPE) scale Increase Physical Activity;Understanding of Exercise Prescription;Increase Strength and Stamina;Knowledge and understanding of Target Heart Rate Range (THRR);Able to understand and use rate of perceived exertion (RPE) scale      Comments Pt first day in the [facility] program. Pt tolerated exercise well with an average MET level of 2.5. Pt is learning his THRR, RPE and ExRx. Off to a great start Reviewed MET's, goals and home ExRx. Pt tolerated exercise well with an average MET level of 3.4. Ptis doing very well and progressing MET's. Pt feels good about his goals and has made new diet changes and is increasing strength, he does feel his stamina is limitied by medication side effects, but overall feels good with his progress. He is going to start exercising with his wife by doing a video home workout 2 days for 30-45 mins per session.      Expected Outcomes Will continue to monitor pt and progress workloads as tolerated without sign or symptom Will continue to monitor pt and progress workloads as tolerated without sign or symptom                Discharge Exercise Prescription (Final Exercise Prescription Changes):  Exercise Prescription Changes - 12/29/22 0835       Response to Exercise   Blood Pressure (Admit) 126/80    Blood Pressure (Exercise)  154/82    Blood Pressure (Exit) 120/82    Heart Rate (Admit) 59 bpm    Heart Rate (Exercise) 112 bpm    Heart Rate (Exit) 68 bpm    Rating of Perceived Exertion (  Exercise) 11.5    Symptoms none    Comments Reviewed MET's, goals and home ExRx    Duration Progress to 30 minutes of  aerobic without signs/symptoms of physical distress    Intensity THRR unchanged      Progression   Progression Continue to progress workloads to maintain intensity without signs/symptoms of physical distress.    Average METs 3.4      Resistance Training   Training Prescription Yes    Weight 5    Reps 10-15    Time 10 Minutes      Recumbant Bike   Level 2    RPM 80    Watts 23    Minutes 15    METs 2.3      Recumbant Elliptical   Level 2    RPM 78    Watts 113    Minutes 15    METs 4.5      Home Exercise Plan   Plans to continue exercise at Home (comment)    Frequency Add 2 additional days to program exercise sessions.    Initial Home Exercises Provided 12/29/22             Nutrition:  Target Goals: Understanding of nutrition guidelines, daily intake of sodium 1500mg , cholesterol 200mg , calories 30% from fat and 7% or less from saturated fats, daily to have 5 or more servings of fruits and vegetables.  Biometrics:  Pre Biometrics - 12/07/22 0801       Pre Biometrics   Waist Circumference 48 inches    Hip Circumference 45.5 inches    Waist to Hip Ratio 1.05 %    Triceps Skinfold 36 mm    % Body Fat 36 %    Grip Strength 38 kg    Flexibility 13.5 in    Single Leg Stand 12.2 seconds              Nutrition Therapy Plan and Nutrition Goals:   Nutrition Assessments:  MEDIFICTS Score Key: >=70 Need to make dietary changes  40-70 Heart Healthy Diet <= 40 Therapeutic Level Cholesterol Diet  Flowsheet Row CARDIAC REHAB PHASE II EXERCISE from 12/15/2022 in [facility]  Picture Your Plate Total Score on Admission  70      Picture Your Plate Scores: <59 Unhealthy dietary pattern with much room for improvement. 41-50 Dietary pattern unlikely to meet recommendations for good health and room for improvement. 51-60 More healthful dietary pattern, with some room for improvement.  >60 Healthy dietary pattern, although there may be some specific behaviors that could be improved.    Nutrition Goals Re-Evaluation:   Nutrition Goals Discharge (Final Nutrition Goals Re-Evaluation):   Psychosocial: Target Goals: Acknowledge presence or absence of significant depression and/or stress, maximize coping skills, provide positive support system. Participant is able to verbalize types and ability to use techniques and skills needed for reducing stress and depression.  Initial Review & Psychosocial Screening:  Initial Psych Review & Screening - 12/07/22 0823       Initial Review   Current issues with Current Depression;Current Stress Concerns    Source of Stress Concerns Chronic Illness;Family;Financial;Occupation;Unable to participate in former interests or hobbies    Comments Elvie shared that he has had some feelings of anxiety/depression/stress since his MI. He says he has had to make a lot of changes which has been overwhelming at times, and overall just feels fatigue. He denies any need for additional resources at  this time.      Family Dynamics   Good Support System? Yes   Trenden has his wife for support     Barriers   Psychosocial barriers to participate in program The patient should benefit from training in stress management and relaxation.      Screening Interventions   Interventions To provide support and resources with identified psychosocial needs;Provide feedback about the scores to participant;Encouraged to exercise    Expected Outcomes Long Term Goal: Stressors or current issues are controlled or eliminated.;Short Term goal: Identification and review with participant of any Quality of Life or  Depression concerns found by scoring the questionnaire.;Long Term goal: The participant improves quality of Life and PHQ9 Scores as seen by post scores and/or verbalization of changes;Short Term goal: Utilizing psychosocial counselor, staff and physician to assist with identification of specific Stressors or current issues interfering with healing process. Setting desired goal for each stressor or current issue identified.             Quality of Life Scores:  Quality of Life - 12/07/22 0846       Quality of Life   Select Quality of Life      Quality of Life Scores   Health/Function Pre 15.43 %    Socioeconomic Pre 18.69 %    Psych/Spiritual Pre 22.36 %    Family Pre 21.1 %    GLOBAL Pre 18.37 %            Scores of 19 and below usually indicate a poorer quality of life in these areas.  A difference of  2-3 points is a clinically meaningful difference.  A difference of 2-3 points in the total score of the Quality of Life Index has been associated with significant improvement in overall quality of life, self-image, physical symptoms, and general health in studies assessing change in quality of life.  PHQ-9: Review Flowsheet  More data exists      12/07/2022 11/09/2022 05/01/2022 09/15/2021 05/12/2021  Depression screen PHQ 2/9  Decreased Interest 1 3 0 0 0  Down, Depressed, Hopeless 1 1 0 0 0  PHQ - 2 Score 2 4 0 0 0  Altered sleeping 2 2 0 - -  Tired, decreased energy 2 3 0 - -  Change in appetite 0 0 0 - -  Feeling bad or failure about yourself  0 1 0 - -  Trouble concentrating 0 2 0 - -  Moving slowly or fidgety/restless 0 2 0 - -  Suicidal thoughts 0 0 0 - -  PHQ-9 Score 6 14 0 - -  Difficult doing work/chores Somewhat difficult Extremely dIfficult Not difficult at all - -   Interpretation of Total Score  Total Score Depression Severity:  1-4 = Minimal depression, 5-9 = Mild depression, 10-14 = Moderate depression, 15-19 = Moderately severe depression, 20-27 = Severe  depression   Psychosocial Evaluation and Intervention:   Psychosocial Re-Evaluation:  Psychosocial Re-Evaluation     Row Name 12/11/22 1801 01/04/23 1115           Psychosocial Re-Evaluation   Current issues with Current Stress Concerns;Current Depression History of Depression;Current Stress Concerns      Comments Ragnar did not voice any increased concerns or stresssors on his first day of exercise. Will review PHq2-9 amd quality of life questionnaire in the upcoming week PHQ2-9 and quality of life questionnaire reviewed by Theadora Byes RN [facility] on 12/13/22. Agustin shared he is not depressed and has the support  he needs from wife, friends and family in [place] , he shared he does not need counseling services. The current areas that impact his QOL are Health and Function primarily lacking strength which he is now focusing on with exercise at cardiac rehab. In addition socioeconomic with facing financial challenges especially with out of state medical bills and trying to cover general household bills. He shared he is remaining optimistic as he has strong faith in God and support from family and friends.      Expected Outcomes Hasheem will have controlled or decreased depression upon completion of cardiac rehab. Rankin will have decreased or controlled stress upon completion of cardiac rehab      Interventions Stress management education;Encouraged to attend Cardiac Rehabilitation for the exercise;Relaxation education Stress management education;Encouraged to attend Cardiac Rehabilitation for the exercise;Relaxation education      Continue Psychosocial Services  Follow up required by staff No Follow up required        Initial Review   Source of Stress Concerns Chronic Illness;Unable to participate in former interests or hobbies;Financial;Family;Unable to perform yard/household activities Chronic Illness;Financial      Comments Will continue to monitor and offer support as needed. Will continue to  monitor and offer support as needed               Psychosocial Discharge (Final Psychosocial Re-Evaluation):  Psychosocial Re-Evaluation - 01/04/23 1115       Psychosocial Re-Evaluation   Current issues with History of Depression;Current Stress Concerns    Comments PHQ2-9 and quality of life questionnaire reviewed by Theadora Byes RN [facility] on 12/13/22. Yony shared he is not depressed and has the support he needs from wife, friends and family in [place] , he shared he does not need counseling services. The current areas that impact his QOL are Health and Function primarily lacking strength which he is now focusing on with exercise at cardiac rehab. In addition socioeconomic with facing financial challenges especially with out of state medical bills and trying to cover general household bills. He shared he is remaining optimistic as he has strong faith in God and support from family and friends.    Expected Outcomes Ramel will have decreased or controlled stress upon completion of cardiac rehab    Interventions Stress management education;Encouraged to attend Cardiac Rehabilitation for the exercise;Relaxation education    Continue Psychosocial Services  No Follow up required      Initial Review   Source of Stress Concerns Chronic Illness;Financial    Comments Will continue to monitor and offer support as needed             Vocational Rehabilitation: Provide vocational rehab assistance to qualifying candidates.   Vocational Rehab Evaluation & Intervention:  Vocational Rehab - 12/07/22 9161       Initial Vocational Rehab Evaluation & Intervention   Assessment shows need for Vocational Rehabilitation No   Alter is hoping to return to work soon            Education: Education Goals: Education classes will be provided on a weekly basis, covering required topics. Participant will state understanding/return demonstration of topics presented.  Learning Barriers/Preferences:   Learning Barriers/Preferences - 12/07/22 0825       Learning Barriers/Preferences   Learning Barriers Sight   wears glasses   Learning Preferences Audio;Computer/Internet;Group Instruction;Individual Instruction;Skilled Demonstration;Verbal Instruction;Video             Education Topics: Hypertension, Hypertension Reduction -Define heart disease and high blood pressure. Discus  how high blood pressure affects the body and ways to reduce high blood pressure.   Exercise and Your Heart -Discuss why it is important to exercise, the FITT principles of exercise, normal and abnormal responses to exercise, and how to exercise safely.   Angina -Discuss definition of angina, causes of angina, treatment of angina, and how to decrease risk of having angina.   Cardiac Medications -Review what the following cardiac medications are used for, how they affect the body, and side effects that may occur when taking the medications.  Medications include Aspirin , Beta blockers, calcium  channel blockers, ACE Inhibitors, angiotensin receptor blockers, diuretics, digoxin, and antihyperlipidemics.   Congestive Heart Failure -Discuss the definition of CHF, how to live with CHF, the signs and symptoms of CHF, and how keep track of weight and sodium intake.   Heart Disease and Intimacy -Discus the effect sexual activity has on the heart, how changes occur during intimacy as we age, and safety during sexual activity.   Smoking Cessation / COPD -Discuss different methods to quit smoking, the health benefits of quitting smoking, and the definition of COPD.   Nutrition I: Fats -Discuss the types of cholesterol, what cholesterol does to the heart, and how cholesterol levels can be controlled.   Nutrition II: Labels -Discuss the different components of food labels and how to read food label   Heart Parts/Heart Disease and PAD -Discuss the anatomy of the heart, the pathway of blood circulation through  the heart, and these are affected by heart disease.   Stress I: Signs and Symptoms -Discuss the causes of stress, how stress may lead to anxiety and depression, and ways to limit stress.   Stress II: Relaxation -Discuss different types of relaxation techniques to limit stress.   Warning Signs of Stroke / TIA -Discuss definition of a stroke, what the signs and symptoms are of a stroke, and how to identify when someone is having stroke.   Knowledge Questionnaire Score:  Knowledge Questionnaire Score - 12/07/22 0838       Knowledge Questionnaire Score   Pre Score 21/24             Core Components/Risk Factors/Patient Goals at Admission:  Personal Goals and Risk Factors at Admission - 12/07/22 0826       Core Components/Risk Factors/Patient Goals on Admission    Weight Management Yes;Obesity;Weight Loss    Intervention Weight Management: Develop a combined nutrition and exercise program designed to reach desired caloric intake, while maintaining appropriate intake of nutrient and fiber, sodium and fats, and appropriate energy expenditure required for the weight goal.;Weight Management: Provide education and appropriate resources to help participant work on and attain dietary goals.;Weight Management/Obesity: Establish reasonable short term and long term weight goals.;Obesity: Provide education and appropriate resources to help participant work on and attain dietary goals.    Expected Outcomes Short Term: Continue to assess and modify interventions until short term weight is achieved;Long Term: Adherence to nutrition and physical activity/exercise program aimed toward attainment of established weight goal;Weight Loss: Understanding of general recommendations for a balanced deficit meal plan, which promotes 1-2 lb weight loss per week and includes a negative energy balance of [phone] kcal/d;Understanding recommendations for meals to include 15-35% energy as protein, 25-35% energy from  fat, 35-60% energy from carbohydrates, less than 200mg  of dietary cholesterol, 20-35 gm of total fiber daily;Understanding of distribution of calorie intake throughout the day with the consumption of 4-5 meals/snacks    Hypertension Yes    Intervention Provide education  on lifestyle modifcations including regular physical activity/exercise, weight management, moderate sodium restriction and increased consumption of fresh fruit, vegetables, and low fat dairy, alcohol moderation, and smoking cessation.;Monitor prescription use compliance.    Expected Outcomes Short Term: Continued assessment and intervention until BP is < 140/30mm HG in hypertensive participants. < 130/47mm HG in hypertensive participants with diabetes, heart failure or chronic kidney disease.;Long Term: Maintenance of blood pressure at goal levels.    Lipids Yes    Intervention Provide education and support for participant on nutrition & aerobic/resistive exercise along with prescribed medications to achieve LDL 70mg , HDL >40mg .    Expected Outcomes Short Term: Participant states understanding of desired cholesterol values and is compliant with medications prescribed. Participant is following exercise prescription and nutrition guidelines.;Long Term: Cholesterol controlled with medications as prescribed, with individualized exercise RX and with personalized nutrition plan. Value goals: LDL < 70mg , HDL > 40 mg.    Stress Yes    Intervention Refer participants experiencing significant psychosocial distress to appropriate mental health specialists for further evaluation and treatment. When possible, include family members and significant others in education/counseling sessions.;Offer individual and/or small group education and counseling on adjustment to heart disease, stress management and health-related lifestyle change. Teach and support self-help strategies.    Expected Outcomes Short Term: Participant demonstrates changes in  health-related behavior, relaxation and other stress management skills, ability to obtain effective social support, and compliance with psychotropic medications if prescribed.;Long Term: Emotional wellbeing is indicated by absence of clinically significant psychosocial distress or social isolation.             Core Components/Risk Factors/Patient Goals Review:   Goals and Risk Factor Review     Row Name 12/11/22 1805 01/04/23 1119           Core Components/Risk Factors/Patient Goals Review   Personal Goals Review Weight Management/Obesity;Stress;Hypertension;Lipids Weight Management/Obesity;Hypertension;Lipids;Stress      Review Treyson started cardiac rehab on 12/11/22/ Daelen did well with exercise. Vital signs were stable. Jaxzen is off to a good start to exercise at cardiac rehab. Vital signs have been stable.      Expected Outcomes Fender will continue to participate in cardiac rehab for exercise, nutrtion and lifestyle modifications Woodson will continue to participate in cardiac rehab for exercise, nutrition and lifestyle modifications               Core Components/Risk Factors/Patient Goals at Discharge (Final Review):   Goals and Risk Factor Review - 01/04/23 1119       Core Components/Risk Factors/Patient Goals Review   Personal Goals Review Weight Management/Obesity;Hypertension;Lipids;Stress    Review Pierre is off to a good start to exercise at cardiac rehab. Vital signs have been stable.    Expected Outcomes Kion will continue to participate in cardiac rehab for exercise, nutrition and lifestyle modifications             ITP Comments:  ITP Comments     Row Name 12/07/22 0802 12/11/22 1800 01/04/23 1112       ITP Comments Wilbert Bihari, MD: Medical Director.  Introduction to the [facility]/Intensive Cardiac Rehab. Initial orientation packet reviewed with the patient. 30 Day ITP Review. Tyrice started cardiac rehab on 12/11/22. Daoud did well with exercise.  30 Day ITP Review. Noha has good attendance and participation in cardiac rehab. Zekiah is off to a good start to exercise.              Comments: See ITP comment.

## 2023-01-10 ENCOUNTER — Encounter (HOSPITAL_COMMUNITY)
Admission: RE | Admit: 2023-01-10 | Discharge: 2023-01-10 | Disposition: A | Discharge status: Home / Self Care | Payer: No Typology Code available for payment source | Source: Ambulatory Visit | Attending: Cardiology | Admitting: Cardiology

## 2023-01-10 DIAGNOSIS — Z5189 Encounter for other specified aftercare: Secondary | ICD-10-CM | POA: Diagnosis not present

## 2023-01-10 DIAGNOSIS — I213 ST elevation (STEMI) myocardial infarction of unspecified site: Secondary | ICD-10-CM

## 2023-01-12 ENCOUNTER — Encounter (HOSPITAL_COMMUNITY): Payer: No Typology Code available for payment source

## 2023-01-15 ENCOUNTER — Encounter (HOSPITAL_COMMUNITY): Payer: No Typology Code available for payment source

## 2023-01-15 ENCOUNTER — Telehealth (HOSPITAL_COMMUNITY): Payer: Self-pay | Admitting: *Deleted

## 2023-01-15 NOTE — Telephone Encounter (Signed)
 Will be out this week 1/13 through 1/17. Chase Walsh said he is doing well. The absence is for a personal matter. He plans to return next week.

## 2023-01-17 ENCOUNTER — Encounter (HOSPITAL_COMMUNITY): Payer: No Typology Code available for payment source

## 2023-01-19 ENCOUNTER — Encounter (HOSPITAL_COMMUNITY): Payer: No Typology Code available for payment source

## 2023-01-22 ENCOUNTER — Encounter (HOSPITAL_COMMUNITY)
Admission: RE | Admit: 2023-01-22 | Discharge: 2023-01-22 | Disposition: A | Discharge status: Home / Self Care | Payer: No Typology Code available for payment source | Source: Ambulatory Visit | Attending: Cardiology | Admitting: Cardiology

## 2023-01-22 DIAGNOSIS — Z5189 Encounter for other specified aftercare: Secondary | ICD-10-CM | POA: Diagnosis not present

## 2023-01-22 DIAGNOSIS — I213 ST elevation (STEMI) myocardial infarction of unspecified site: Secondary | ICD-10-CM

## 2023-01-24 ENCOUNTER — Encounter (HOSPITAL_COMMUNITY)
Admission: RE | Admit: 2023-01-24 | Discharge: 2023-01-24 | Disposition: A | Discharge status: Home / Self Care | Payer: No Typology Code available for payment source | Source: Ambulatory Visit | Attending: Cardiology | Admitting: Cardiology

## 2023-01-24 DIAGNOSIS — Z5189 Encounter for other specified aftercare: Secondary | ICD-10-CM | POA: Diagnosis not present

## 2023-01-26 ENCOUNTER — Encounter (HOSPITAL_COMMUNITY)
Admission: RE | Admit: 2023-01-26 | Discharge: 2023-01-26 | Disposition: A | Discharge status: Home / Self Care | Payer: No Typology Code available for payment source | Source: Ambulatory Visit | Attending: Cardiology | Admitting: Cardiology

## 2023-01-26 DIAGNOSIS — I213 ST elevation (STEMI) myocardial infarction of unspecified site: Secondary | ICD-10-CM

## 2023-01-26 DIAGNOSIS — Z5189 Encounter for other specified aftercare: Secondary | ICD-10-CM | POA: Diagnosis not present

## 2023-01-28 ENCOUNTER — Other Ambulatory Visit: Payer: Self-pay | Admitting: Nurse Practitioner

## 2023-01-29 ENCOUNTER — Other Ambulatory Visit: Payer: Self-pay | Admitting: Nurse Practitioner

## 2023-01-29 ENCOUNTER — Encounter (HOSPITAL_COMMUNITY)
Admission: RE | Admit: 2023-01-29 | Discharge: 2023-01-29 | Disposition: A | Discharge status: Home / Self Care | Payer: No Typology Code available for payment source | Source: Ambulatory Visit | Attending: Cardiology | Admitting: Cardiology

## 2023-01-29 DIAGNOSIS — I213 ST elevation (STEMI) myocardial infarction of unspecified site: Secondary | ICD-10-CM

## 2023-01-29 DIAGNOSIS — Z5189 Encounter for other specified aftercare: Secondary | ICD-10-CM | POA: Diagnosis not present

## 2023-01-31 ENCOUNTER — Encounter: Payer: Self-pay | Admitting: Cardiology

## 2023-01-31 ENCOUNTER — Encounter (HOSPITAL_COMMUNITY): Payer: No Typology Code available for payment source

## 2023-01-31 ENCOUNTER — Ambulatory Visit: Payer: PRIVATE HEALTH INSURANCE | Attending: Cardiology | Admitting: Cardiology

## 2023-01-31 VITALS — BP 130/90 | HR 60 | Ht 68.0 in | Wt 216.0 lb

## 2023-01-31 DIAGNOSIS — E785 Hyperlipidemia, unspecified: Secondary | ICD-10-CM | POA: Diagnosis not present

## 2023-01-31 DIAGNOSIS — N184 Chronic kidney disease, stage 4 (severe): Secondary | ICD-10-CM

## 2023-01-31 DIAGNOSIS — I071 Rheumatic tricuspid insufficiency: Secondary | ICD-10-CM

## 2023-01-31 DIAGNOSIS — I2510 Atherosclerotic heart disease of native coronary artery without angina pectoris: Secondary | ICD-10-CM

## 2023-01-31 DIAGNOSIS — I10 Essential (primary) hypertension: Secondary | ICD-10-CM | POA: Diagnosis not present

## 2023-01-31 NOTE — Patient Instructions (Signed)
Medication Instructions:  Continue current medications *If you need a refill on your cardiac medications before your next appointment, please call your pharmacy*   Lab Work: none If you have labs (blood work) drawn today and your tests are completely normal, you will receive your results only by: MyChart Message (if you have MyChart) OR A paper copy in the mail If you have any lab test that is abnormal or we need to change your treatment, we will call you to review the results.   Testing/Procedures: none   Follow-Up: At [facility], you and your health needs are our priority.  As part of our continuing mission to provide you with exceptional heart care, we have created designated Provider Care Teams.  These Care Teams include your primary Cardiologist (physician) and Advanced Practice Providers (APPs -  Physician Assistants and Nurse Practitioners) who all work together to provide you with the care you need, when you need it.  We recommend signing up for the patient portal called "MyChart".  Sign up information is provided on this After Visit Summary.  MyChart is used to connect with patients for Virtual Visits (Telemedicine).  Patients are able to view lab/test results, encounter notes, upcoming appointments, etc.  Non-urgent messages can be sent to your provider as well.   To learn more about what you can do with MyChart, go to [email or web address].    Your next appointment:   As needed  Provider:   Little Ishikawa, MD     Other Instructions none

## 2023-01-31 NOTE — Progress Notes (Signed)
Cardiology Office Note:    Date:  01/31/2023   ID:  Chase Walsh, DOB 1970/11/22, MRN [id number]  PCP:  [name], FNP  Cardiologist:  Little Ishikawa, MD  Electrophysiologist:  None   Referring MD: [name], FNP   Chief Complaint  Patient presents with   Coronary Artery Disease    History of Present Illness:    Chase Walsh is a 53 y.o. male with a hx of CAD s/p STEMI 09/2022 complicated by VF arrest, hypertension, hyperlipidemia, CKD stage IV who is referred by [name], NP for evaluation of CAD.  He was admitted 10/01/2022 in [place] with VF arrest due to STEMI.  Underwent LHC which showed distal LCx occlusion status post DES, also noted to have moderate disease in LAD and RCA.  Echocardiogram showed normal LVEF, no significant valvular disease.    He was admitted 10/2022 with some small bowel obstruction.  Managed conservatively, required starting IV cangrelor as all p.o. medications were held.  SBO resolved with conservative management and he was transitioned back to p.o. medications.  Echocardiogram 11/22/2022 showed EF 60 to 65%, grade 1 diastolic dysfunction, normal RV function, moderate tricuspid regurgitation.  Since last clinic visit, reports he has been doing OK.  Reports episode of chest pain, cramping  on left side, lasted few seconds and resolved, happened several times.  Doing cardiac rehab, no exertional chest pain.   Reports some dyspnea but has improved.  Reports mild nosebleeds but otherwise no bleeding on DAPT.    Past Medical History:  Diagnosis Date   Allergy    Class 1 obesity due to excess calories with serious comorbidity and body mass index (BMI) of 33.0 to 33.9 in adult 07/08/2018   Diverticulosis    Fall 11/21/2018   Gout    Hematochezia 04/21/2016   History of COVID-19 02/05/2019   Hypertension    Lower GI bleed    Myocardial infarction (HCC) 10/01/22   Vitiligo     Past Surgical History:  Procedure Laterality Date    COLONOSCOPY N/A 04/22/2016   Procedure: COLONOSCOPY;  Surgeon: Sherrilyn Rist, MD;  Location[facility] ENDOSCOPY;  Service: Endoscopy;  Laterality: N/A;   HERNIA REPAIR      Current Medications: Current Meds  Medication Sig   Ascorbic Acid (VITAMIN C) 1000 MG tablet Take 1,000 mg by mouth every other day.   aspirin 81 MG chewable tablet Chew 1 tablet (81 mg total) by mouth daily.   atorvastatin (LIPITOR) 80 MG tablet Take 1 tablet (80 mg total) by mouth daily.   BRILINTA 90 MG TABS tablet Take 1 tablet by mouth twice daily   dapagliflozin propanediol (FARXIGA) 10 MG TABS tablet Take by mouth daily.   fluocinonide (LIDEX) 0.05 % external solution Apply 1 application  topically as needed (breakout).   magnesium oxide (MAG-OX) 400 (240 Mg) MG tablet Take 400 mg by mouth daily.   metoprolol succinate (TOPROL-XL) 25 MG 24 hr tablet Take 1 tablet (25 mg total) by mouth daily.     Allergies:   Allopurinol   Social History   Socioeconomic History   Marital status: Married    Spouse name: Not on file   Number of children: Not on file   Years of education: Not on file   Highest education level: 12th grade  Occupational History   Not on file  Tobacco Use   Smoking status: Never   Smokeless tobacco: Never  Substance and Sexual Activity   Alcohol use:  No   Drug use: No   Sexual activity: Yes    Partners: Female    Comment: married, monogamous  Other Topics Concern   Not on file  Social History Narrative   Not on file   Social Drivers of Health   Financial Resource Strain: Low Risk  (05/01/2022)   Overall Financial Resource Strain (CARDIA)    Difficulty of Paying Living Expenses: Not very hard  Food Insecurity: No Food Insecurity (11/23/2022)   Hunger Vital Sign    Worried About Running Out of Food in the Last Year: Never true    Ran Out of Food in the Last Year: Never true  Transportation Needs: No Transportation Needs (11/23/2022)   PRAPARE - Scientist, research (physical sciences) (Medical): No    Lack of Transportation (Non-Medical): No  Physical Activity: Insufficiently Active (05/01/2022)   Exercise Vital Sign    Days of Exercise per Week: 3 days    Minutes of Exercise per Session: 40 min  Stress: No Stress Concern Present (05/01/2022)   [employer] of Occupational Health - Occupational Stress Questionnaire    Feeling of Stress : Not at all  Social Connections: Socially Integrated (05/01/2022)   Social Connection and Isolation Panel [NHANES]    Frequency of Communication with Friends and Family: More than three times a week    Frequency of Social Gatherings with Friends and Family: Once a week    Attends Religious Services: More than 4 times per year    Active Member of [employer] or Organizations: Yes    Attends Engineer, structural: More than 4 times per year    Marital Status: Married     Family History: The patient's family history includes Arthritis in his mother; Breast cancer in his mother; Cancer in his mother and paternal uncle; Diabetes in his brother and sister; Kidney disease in his brother and brother; Obesity in his brother, mother, and sister; Stroke in his father and sister. There is no history of Inflammatory bowel disease.  ROS:   Please see the history of present illness.     All other systems reviewed and are negative.  EKGs/Labs/Other Studies Reviewed:    The following studies were reviewed today:   EKG:   10/19/22: Sinus bradycardia, rate 56, Q waves in leads III, aVF 11/17/2022: Normal sinus rhythm, rate 63, Q waves in leads III, aVF  Recent Labs: 10/22/2022: ALT 36; Magnesium 2.3 10/25/2022: BUN 16; Creatinine, Ser 2.31; Hemoglobin 12.1; Platelets 186; Potassium 4.1; Sodium 138  Recent Lipid Panel    Component Value Date/Time   CHOL 174 08/29/2022 1501   TRIG 203 (H) 08/29/2022 1501   HDL 27 (L) 08/29/2022 1501   CHOLHDL 6.4 (H) 08/29/2022 1501   LDLCALC 111 (H) 08/29/2022 1501    Physical Exam:     VS:  BP (!) 130/90   Pulse 60   Ht 5\' 8"  (1.727 m)   Wt 216 lb (98 kg)   SpO2 99%   BMI 32.84 kg/m     Wt Readings from Last 3 Encounters:  01/31/23 216 lb (98 kg)  12/07/22 216 lb 0.8 oz (98 kg)  11/17/22 216 lb 3.2 oz (98.1 kg)     GEN:   in no acute distress HEENT: Normal NECK: No JVD; No carotid bruits CARDIAC: RRR, no murmurs, rubs, gallops RESPIRATORY:  Clear to auscultation without rales, wheezing or rhonchi  ABDOMEN: Soft, non-tender, non-distended MUSCULOSKELETAL:  No edema; No deformity  SKIN: Warm and  dry NEUROLOGIC:  Alert and oriented x 3 PSYCHIATRIC:  Normal affect   ASSESSMENT:    1. Coronary artery disease involving native coronary artery of native heart, unspecified whether angina present   2. Tricuspid valve insufficiency, unspecified etiology   3. Essential hypertension   4. Hyperlipidemia, unspecified hyperlipidemia type   5. Chronic kidney disease (CKD), stage IV (severe) (HCC)      PLAN:    CAD: admitted 10/01/2022 in [place] with VF arrest due to STEMI.  Underwent LHC which showed distal LCx occlusion status post DES, also noted to have moderate disease in LAD and RCA.  Echocardiogram 11/22/2022 showed EF 60 to 65%, grade 1 diastolic dysfunction, normal RV function, moderate tricuspid regurgitation. -Continue aspirin 81 mg daily, ticagrelor 90 mg twice daily.  Continue DAPT through 09/2023, aspirin indefinitely -Continue atorvastatin 80 mg daily -Continue Toprol-XL 25 mg daily -Cardiac rehab  Tricuspid regurgitation: Moderate on echo 11/2022.  Recommend repeat echocardiogram in 1 year to monitor  Hyperlipidemia: On atorvastatin 80 mg daily.  LDL 56 on 09/2022  Hypertension: On Toprol-XL 25 mg daily.  BP elevated in clinic today but reports has been under good control and has had it checked at cardiac rehab.  Will continue to monitor  CKD stage IV: Creatinine 2.36 on 12/18/22.  Follows with nephrology, started on Farxiga  RTC as  needed. [place] is moving to [place] next [place]   Medication Adjustments/Labs and Tests Ordered: Current medicines are reviewed at length with the patient today.  Concerns regarding medicines are outlined above.  No orders of the defined types were placed in this encounter.  No orders of the defined types were placed in this encounter.   Patient Instructions  Medication Instructions:  Continue current medications *If you need a refill on your cardiac medications before your next appointment, please call your pharmacy*   Lab Work: none If you have labs (blood work) drawn today and your tests are completely normal, you will receive your results only by: MyChart Message (if you have MyChart) OR A paper copy in the mail If you have any lab test that is abnormal or we need to change your treatment, we will call you to review the results.   Testing/Procedures: none   Follow-Up: At [facility], you and your health needs are our priority.  As part of our continuing mission to provide you with exceptional heart care, we have created designated Provider Care Teams.  These Care Teams include your primary Cardiologist (physician) and Advanced Practice Providers (APPs -  Physician Assistants and Nurse Practitioners) who all work together to provide you with the care you need, when you need it.  We recommend signing up for the patient portal called "MyChart".  Sign up information is provided on this After Visit Summary.  MyChart is used to connect with patients for Virtual Visits (Telemedicine).  Patients are able to view lab/test results, encounter notes, upcoming appointments, etc.  Non-urgent messages can be sent to your provider as well.   To learn more about what you can do with MyChart, go to [email or web address].    Your next appointment:   As needed  Provider:   Little Ishikawa, MD     Other Instructions none         Signed, Little Ishikawa, MD   01/31/2023 8:38 AM    [facility]

## 2023-02-01 NOTE — Progress Notes (Signed)
 Cardiac Individual Treatment Plan  Patient Details  Name: Chase Walsh MRN: [id number] Date of Birth: 11-03-1970 Referring Provider:   Flowsheet Row CARDIAC REHAB PHASE II ORIENTATION from 12/07/2022 in [facility]  Referring Provider Percell Belt, MD       Initial Encounter Date:  Flowsheet Row CARDIAC REHAB PHASE II ORIENTATION from 12/07/2022 in [facility]  Date 12/07/22       Visit Diagnosis: 10/01/22 ST elevation myocardial infarction (STEMI)  Patient's Home Medications on Admission:  Current Outpatient Medications:    Ascorbic Acid (VITAMIN C) 1000 MG tablet, Take 1,000 mg by mouth every other day., Disp: , Rfl:    aspirin 81 MG chewable tablet, Chew 1 tablet (81 mg total) by mouth daily., Disp: 90 tablet, Rfl: 2   atorvastatin (LIPITOR) 80 MG tablet, Take 1 tablet (80 mg total) by mouth daily., Disp: 90 tablet, Rfl: 2   BRILINTA 90 MG TABS tablet, Take 1 tablet by mouth twice daily, Disp: 60 tablet, Rfl: 0   dapagliflozin propanediol (FARXIGA) 10 MG TABS tablet, Take by mouth daily., Disp: , Rfl:    fluocinonide (LIDEX) 0.05 % external solution, Apply 1 application  topically as needed (breakout)., Disp: , Rfl:    magnesium oxide (MAG-OX) 400 (240 Mg) MG tablet, Take 400 mg by mouth daily., Disp: , Rfl:    metoprolol succinate (TOPROL-XL) 25 MG 24 hr tablet, Take 1 tablet (25 mg total) by mouth daily., Disp: 90 tablet, Rfl: 2   nitroGLYCERIN (NITROSTAT) 0.4 MG SL tablet, Place 0.4 mg under the tongue every 5 (five) minutes as needed for chest pain. (Patient not taking: Reported on 11/17/2022), Disp: , Rfl:   Past Medical History: Past Medical History:  Diagnosis Date   Allergy    Class 1 obesity due to excess calories with serious comorbidity and body mass index (BMI) of 33.0 to 33.9 in adult 07/08/2018   Diverticulosis    Fall 11/21/2018   Gout     Hematochezia 04/21/2016   History of COVID-19 02/05/2019   Hypertension    Lower GI bleed    Myocardial infarction (HCC) 10/01/22   Vitiligo     Tobacco Use: Social History   Tobacco Use  Smoking Status Never  Smokeless Tobacco Never    Labs: Review Flowsheet  More data exists      Latest Ref Rng & Units 05/12/2020 01/26/2021 05/12/2021 02/28/2022 08/29/2022  Labs for ITP Cardiac and Pulmonary Rehab  Cholestrol 100 - 199 mg/dL - - 010  272  536   LDL (calc) 0 - 99 mg/dL - - 644  034  742   HDL-C >39 mg/dL - - 30  31  27    Trlycerides 0 - 149 mg/dL - - 595  638  756   Hemoglobin A1c 4.8 - 5.6 % 5.6  5.6  - - 5.4     Capillary Blood Glucose: No results found for: "GLUCAP"   Exercise Target Goals: Exercise Program Goal: Individual exercise prescription set using results from initial 6 min walk test and THRR while considering  patient's activity barriers and safety.   Exercise Prescription Goal: Initial exercise prescription builds to 30-45 minutes a day of aerobic activity, 2-3 days per week.  Home exercise guidelines will be given to patient during program as part of exercise prescription that the participant will acknowledge.  Activity Barriers & Risk Stratification:  Activity Barriers & Cardiac  Risk Stratification - 12/07/22 0823       Activity Barriers & Cardiac Risk Stratification   Activity Barriers Balance Concerns;Shortness of Breath;Joint Problems    Cardiac Risk Stratification High   <5 METs on            6 Minute Walk:  6 Minute Walk     Row Name 12/07/22 0957         6 Minute Walk   Phase Initial     Distance 1440 feet     Walk Time 6 minutes     # of Rest Breaks 0     MPH 2.73     METS 3.74     RPE 9     Perceived Dyspnea  0     VO2 Peak 13.07     Symptoms No     Resting HR 70 bpm     Resting BP 126/88     Resting Oxygen Saturation  98 %     Exercise Oxygen Saturation  during 6 min walk 98 %     Max Ex. HR 89 bpm     Max Ex. BP  132/82     2 Minute Post BP 122/84              Oxygen Initial Assessment:   Oxygen Re-Evaluation:   Oxygen Discharge (Final Oxygen Re-Evaluation):   Initial Exercise Prescription:  Initial Exercise Prescription - 12/07/22 0900       Date of Initial Exercise RX and Referring Provider   Date 12/07/22    Referring Provider Percell Belt, MD    Expected Discharge Date 02/28/23      Recumbant Bike   Level 1    RPM 60    Watts 30    Minutes 15    METs 3      Recumbant Elliptical   Level 1    RPM 55    Watts 70    Minutes 15    METs 3      Prescription Details   Frequency (times per week) 3    Duration Progress to 30 minutes of continuous aerobic without signs/symptoms of physical distress      Intensity   THRR 40-80% of Max Heartrate 67-134    Ratings of Perceived Exertion 11-13    Perceived Dyspnea 0-4      Progression   Progression Continue progressive overload as per policy without signs/symptoms or physical distress.      Resistance Training   Training Prescription Yes    Weight 4    Reps 10-15             Perform Capillary Blood Glucose checks as needed.  Exercise Prescription Changes:   Exercise Prescription Changes     Row Name 12/11/22 0822 12/29/22 0835 01/24/23 0837 02/02/23 0830       Response to Exercise   Blood Pressure (Admit) 140/88 126/80 138/86 120/72    Blood Pressure (Exercise) 140/84 154/82 --  No BP needed. Over 10 ex sessions --  No BP needed. Over 10 ex sessions    Blood Pressure (Exit) 120/64 120/82 118/70 126/80    Heart Rate (Admit) 63 bpm 59 bpm 67 bpm 61 bpm    Heart Rate (Exercise) 91 bpm 112 bpm 116 bpm 132 bpm    Heart Rate (Exit) 69 bpm 68 bpm 71 bpm 70 bpm    Rating of Perceived Exertion (Exercise) 10.5 11.5 11.5 11    Symptoms none none none  none    Comments Pt first day in the [employer] program Reviewed MET's, goals and home ExRx Reviewed MET's Reviewed MET's and goals    Duration Progress to 30  minutes of  aerobic without signs/symptoms of physical distress Progress to 30 minutes of  aerobic without signs/symptoms of physical distress Progress to 30 minutes of  aerobic without signs/symptoms of physical distress Progress to 30 minutes of  aerobic without signs/symptoms of physical distress    Intensity THRR unchanged THRR unchanged THRR unchanged THRR unchanged      Progression   Progression Continue to progress workloads to maintain intensity without signs/symptoms of physical distress. Continue to progress workloads to maintain intensity without signs/symptoms of physical distress. Continue to progress workloads to maintain intensity without signs/symptoms of physical distress. Continue to progress workloads to maintain intensity without signs/symptoms of physical distress.    Average METs 2.5 3.4 4.2 4.05      Resistance Training   Training Prescription Yes Yes No Yes    Weight 4 5 5 5     Reps 10-15 10-15 10-15 10-15    Time 10 Minutes 10 Minutes 10 Minutes 10 Minutes      Recumbant Bike   Level 1 2 3 3     RPM 66 80 84 83    Watts 17 23 53 55    Minutes 15 15 15 15     METs 2 2.3 3.8 3.5      Recumbant Elliptical   Level 1 2 3 3     RPM 58 78 79 83    Watts 76 113 122 117    Minutes 15 15 15 15     METs 3 4.5 4.6 4.6      Home Exercise Plan   Plans to continue exercise at -- Home (comment) Home (comment) Home (comment)    Frequency -- Add 2 additional days to program exercise sessions. Add 2 additional days to program exercise sessions. Add 2 additional days to program exercise sessions.    Initial Home Exercises Provided -- 12/29/22 12/29/22 12/29/22             Exercise Comments:   Exercise Comments     Row Name 12/11/22 0825 12/29/22 0840 01/24/23 0845 02/02/23 1101     Exercise Comments Pt first day in the [facility] program. Pt tolerated exercise well with an average MET level of 2.5. Pt is learning his THRR, RPE and ExRx. Off to a great start Reviewed  MET's, goals and home ExRx. Pt tolerated exercise well with an average MET level of 3.4. Ptis doing very well and progressing MET's. Pt feels good about his goals and has made new diet changes and is increasing strength, he does feel his stamina is limitied by medication side effects, but overall feels good with his progress. He is going to start exercising with his wife by doing a video home workout 2 days for 30-45 mins per session. Reviewed MET's. Pt tolerated exercise well with an average MET level of 4.2. Pt is doing very well with exercise and is progressing MET's. He feels good with the progress he has made even with his sessions missed. Reviewed MET's and goals. Pt tolerated exercise well with an average MET level of 3.4. He is feeling good with his progress and is gaining strength and stamina. He has made a lot of dietary changes on his own and states he's having success. He states he has no additional needs at this time. Excited for travel over  the weekend to celebrate his birthday             Exercise Goals and Review:   Exercise Goals     Row Name 12/07/22 0804             Exercise Goals   Increase Physical Activity Yes       Intervention Provide advice, education, support and counseling about physical activity/exercise needs.;Develop an individualized exercise prescription for aerobic and resistive training based on initial evaluation findings, risk stratification, comorbidities and participant's personal goals.       Expected Outcomes Short Term: Attend rehab on a regular basis to increase amount of physical activity.;Long Term: Add in home exercise to make exercise part of routine and to increase amount of physical activity.;Long Term: Exercising regularly at least 3-5 days a week.       Increase Strength and Stamina Yes       Intervention Provide advice, education, support and counseling about physical activity/exercise needs.;Develop an individualized exercise prescription for  aerobic and resistive training based on initial evaluation findings, risk stratification, comorbidities and participant's personal goals.       Expected Outcomes Short Term: Increase workloads from initial exercise prescription for resistance, speed, and METs.;Short Term: Perform resistance training exercises routinely during rehab and add in resistance training at home;Long Term: Improve cardiorespiratory fitness, muscular endurance and strength as measured by increased METs and functional capacity ( )       Able to understand and use rate of perceived exertion (RPE) scale Yes       Intervention Provide education and explanation on how to use RPE scale       Expected Outcomes Long Term:  Able to use RPE to guide intensity level when exercising independently;Short Term: Able to use RPE daily in rehab to express subjective intensity level       Knowledge and understanding of Target Heart Rate Range (THRR) Yes       Intervention Provide education and explanation of THRR including how the numbers were predicted and where they are located for reference       Expected Outcomes Short Term: Able to state/look up THRR;Short Term: Able to use daily as guideline for intensity in rehab;Long Term: Able to use THRR to govern intensity when exercising independently       Understanding of Exercise Prescription Yes       Intervention Provide education, explanation, and written materials on patient's individual exercise prescription       Expected Outcomes Short Term: Able to explain program exercise prescription;Long Term: Able to explain home exercise prescription to exercise independently                Exercise Goals Re-Evaluation :  Exercise Goals Re-Evaluation     Row Name 12/11/22 0826 12/29/22 0837 02/02/23 1057         Exercise Goal Re-Evaluation   Exercise Goals Review Increase Physical Activity;Understanding of Exercise Prescription;Increase Strength and Stamina;Knowledge and understanding of  Target Heart Rate Range (THRR);Able to understand and use rate of perceived exertion (RPE) scale Increase Physical Activity;Understanding of Exercise Prescription;Increase Strength and Stamina;Knowledge and understanding of Target Heart Rate Range (THRR);Able to understand and use rate of perceived exertion (RPE) scale Increase Physical Activity;Understanding of Exercise Prescription;Increase Strength and Stamina;Knowledge and understanding of Target Heart Rate Range (THRR);Able to understand and use rate of perceived exertion (RPE) scale     Comments Pt first day in the [facility] program. Pt tolerated exercise well with an  average MET level of 2.5. Pt is learning his THRR, RPE and ExRx. Off to a great start Reviewed MET's, goals and home ExRx. Pt tolerated exercise well with an average MET level of 3.4. Ptis doing very well and progressing MET's. Pt feels good about his goals and has made new diet changes and is increasing strength, he does feel his stamina is limitied by medication side effects, but overall feels good with his progress. He is going to start exercising with his wife by doing a video home workout 2 days for 30-45 mins per session. Reviewed MET's and goals. Pt tolerated exercise well with an average MET level of 3.4. He is feeling good with his progress and is gaining strength and stamina. He has made a lot of dietary changes on his own and states he's having success. He states he has no additional needs at this time. Excited for travel over the weekend to celebrate his birthday     Expected Outcomes Will continue to monitor pt and progress workloads as tolerated without sign or symptom Will continue to monitor pt and progress workloads as tolerated without sign or symptom Will continue to monitor pt and progress workloads as tolerated without sign or symptom              Discharge Exercise Prescription (Final Exercise Prescription Changes):  Exercise Prescription Changes - 02/02/23  0830       Response to Exercise   Blood Pressure (Admit) 120/72    Blood Pressure (Exercise) --   No BP needed. Over 10 ex sessions   Blood Pressure (Exit) 126/80    Heart Rate (Admit) 61 bpm    Heart Rate (Exercise) 132 bpm    Heart Rate (Exit) 70 bpm    Rating of Perceived Exertion (Exercise) 11    Symptoms none    Comments Reviewed MET's and goals    Duration Progress to 30 minutes of  aerobic without signs/symptoms of physical distress    Intensity THRR unchanged      Progression   Progression Continue to progress workloads to maintain intensity without signs/symptoms of physical distress.    Average METs 4.05      Resistance Training   Training Prescription Yes    Weight 5    Reps 10-15    Time 10 Minutes      Recumbant Bike   Level 3    RPM 83    Watts 55    Minutes 15    METs 3.5      Recumbant Elliptical   Level 3    RPM 83    Watts 117    Minutes 15    METs 4.6      Home Exercise Plan   Plans to continue exercise at Home (comment)    Frequency Add 2 additional days to program exercise sessions.    Initial Home Exercises Provided 12/29/22             Nutrition:  Target Goals: Understanding of nutrition guidelines, daily intake of sodium 1500mg , cholesterol 200mg , calories 30% from fat and 7% or less from saturated fats, daily to have 5 or more servings of fruits and vegetables.  Biometrics:  Pre Biometrics - 12/07/22 0801       Pre Biometrics   Waist Circumference 48 inches    Hip Circumference 45.5 inches    Waist to Hip Ratio 1.05 %    Triceps Skinfold 36 mm    % Body Fat 36 %  Grip Strength 38 kg    Flexibility 13.5 in    Single Leg Stand 12.2 seconds              Nutrition Therapy Plan and Nutrition Goals:   Nutrition Assessments:  MEDIFICTS Score Key: >=70 Need to make dietary changes  40-70 Heart Healthy Diet <= 40 Therapeutic Level Cholesterol Diet   Flowsheet Row CARDIAC REHAB PHASE II EXERCISE from 12/15/2022 in  [facility]  Picture Your Plate Total Score on Admission 70      Picture Your Plate Scores: <16 Unhealthy dietary pattern with much room for improvement. 41-50 Dietary pattern unlikely to meet recommendations for good health and room for improvement. 51-60 More healthful dietary pattern, with some room for improvement.  >60 Healthy dietary pattern, although there may be some specific behaviors that could be improved.    Nutrition Goals Re-Evaluation:   Nutrition Goals Re-Evaluation:   Nutrition Goals Discharge (Final Nutrition Goals Re-Evaluation):   Psychosocial: Target Goals: Acknowledge presence or absence of significant depression and/or stress, maximize coping skills, provide positive support system. Participant is able to verbalize types and ability to use techniques and skills needed for reducing stress and depression.  Initial Review & Psychosocial Screening:  Initial Psych Review & Screening - 12/07/22 0823       Initial Review   Current issues with Current Depression;Current Stress Concerns    Source of Stress Concerns Chronic Illness;Family;Financial;Occupation;Unable to participate in former interests or hobbies    Comments Chase Walsh shared that he has had some feelings of anxiety/depression/stress since his MI. He says he has had to make a lot of changes which has been overwhelming at times, and overall just feels fatigue. He denies any need for additional resources at this time.      Family Dynamics   Good Support System? Yes   Eva has his wife for support     Barriers   Psychosocial barriers to participate in program The patient should benefit from training in stress management and relaxation.      Screening Interventions   Interventions To provide support and resources with identified psychosocial needs;Provide feedback about the scores to participant;Encouraged to exercise    Expected Outcomes Long Term  Goal: Stressors or current issues are controlled or eliminated.;Short Term goal: Identification and review with participant of any Quality of Life or Depression concerns found by scoring the questionnaire.;Long Term goal: The participant improves quality of Life and PHQ9 Scores as seen by post scores and/or verbalization of changes;Short Term goal: Utilizing psychosocial counselor, staff and physician to assist with identification of specific Stressors or current issues interfering with healing process. Setting desired goal for each stressor or current issue identified.             Quality of Life Scores:  Quality of Life - 12/07/22 0846       Quality of Life   Select Quality of Life      Quality of Life Scores   Health/Function Pre 15.43 %    Socioeconomic Pre 18.69 %    Psych/Spiritual Pre 22.36 %    Family Pre 21.1 %    GLOBAL Pre 18.37 %            Scores of 19 and below usually indicate a poorer quality of life in these areas.  A difference of  2-3 points is a clinically meaningful difference.  A difference of 2-3 points in the total score  of the Quality of Life Index has been associated with significant improvement in overall quality of life, self-image, physical symptoms, and general health in studies assessing change in quality of life.  PHQ-9: Review Flowsheet  More data exists      12/07/2022 11/09/2022 05/01/2022 09/15/2021 05/12/2021  Depression screen PHQ 2/9  Decreased Interest 1 3 0 0 0  Down, Depressed, Hopeless 1 1 0 0 0  PHQ - 2 Score 2 4 0 0 0  Altered sleeping 2 2 0 - -  Tired, decreased energy 2 3 0 - -  Change in appetite 0 0 0 - -  Feeling bad or failure about yourself  0 1 0 - -  Trouble concentrating 0 2 0 - -  Moving slowly or fidgety/restless 0 2 0 - -  Suicidal thoughts 0 0 0 - -  PHQ-9 Score 6 14 0 - -  Difficult doing work/chores Somewhat difficult Extremely dIfficult Not difficult at all - -   Interpretation of Total Score  Total Score  Depression Severity:  1-4 = Minimal depression, 5-9 = Mild depression, 10-14 = Moderate depression, 15-19 = Moderately severe depression, 20-27 = Severe depression   Psychosocial Evaluation and Intervention:   Psychosocial Re-Evaluation:  Psychosocial Re-Evaluation     Row Name 12/11/22 1801 01/04/23 1115 02/01/23 0831         Psychosocial Re-Evaluation   Current issues with Current Stress Concerns;Current Depression History of Depression;Current Stress Concerns History of Depression;Current Stress Concerns     Comments Chase Walsh did not voice any increased concerns or stresssors on his first day of exercise. Will review PHq2-9 amd quality of life questionnaire in the upcoming week PHQ2-9 and quality of life questionnaire reviewed by Chase Sine RN [facility] on 12/13/22. Chase Walsh shared he is not depressed and has the support he needs from wife, friends and family in [place], he shared he does not need counseling services. The current areas that impact his QOL are Health and Function primarily lacking strength which he is now focusing on with exercise at cardiac rehab. In addition socioeconomic with facing financial challenges especially with out of state medical bills and trying to cover general household bills. He shared he is remaining optimistic as he has strong faith in God and support from family and friends. Chase Walsh has not voiced any increased concerns or stressors during exercise at cardiac rehab     Expected Outcomes Chase Walsh will have controlled or decreased depression upon completion of cardiac rehab. Chase Walsh will have decreased or controlled stress upon completion of cardiac rehab Chase Walsh will have decreased or controlled stress upon completion of cardiac rehab     Interventions Stress management education;Encouraged to attend Cardiac Rehabilitation for the exercise;Relaxation education Stress management education;Encouraged to attend Cardiac Rehabilitation for the exercise;Relaxation education Stress  management education;Encouraged to attend Cardiac Rehabilitation for the exercise;Relaxation education     Continue Psychosocial Services  Follow up required by staff No Follow up required No Follow up required       Initial Review   Source of Stress Concerns Chronic Illness;Unable to participate in former interests or hobbies;Financial;Family;Unable to perform yard/household activities Chronic Illness;Financial Chronic Illness;Financial     Comments Will continue to monitor and offer support as needed. Will continue to monitor and offer support as needed Will continue to monitor and offer support as needed              Psychosocial Discharge (Final Psychosocial Re-Evaluation):  Psychosocial Re-Evaluation - 02/01/23 1610  Psychosocial Re-Evaluation   Current issues with History of Depression;Current Stress Concerns    Comments Chase Walsh has not voiced any increased concerns or stressors during exercise at cardiac rehab    Expected Outcomes Chase Walsh will have decreased or controlled stress upon completion of cardiac rehab    Interventions Stress management education;Encouraged to attend Cardiac Rehabilitation for the exercise;Relaxation education    Continue Psychosocial Services  No Follow up required      Initial Review   Source of Stress Concerns Chronic Illness;Financial    Comments Will continue to monitor and offer support as needed             Vocational Rehabilitation: Provide vocational rehab assistance to qualifying candidates.   Vocational Rehab Evaluation & Intervention:  Vocational Rehab - 12/07/22 1610       Initial Vocational Rehab Evaluation & Intervention   Assessment shows need for Vocational Rehabilitation No   Chase Walsh is hoping to return to work soon            Education: Education Goals: Education classes will be provided on a weekly basis, covering required topics. Participant will state understanding/return demonstration of topics presented.     Education     Row Name 12/13/22 1100     Education   Cardiac Education Topics [facility]   Secondary school teacher School   Educator Nurse;Respiratory Therapist   Weekly Topic Efficiency Cooking - Meals in a Snap   Instruction Review Code 1- Verbalizes Understanding   Class Start Time 0815   Class Stop Time 0851   Class Time Calculation (min) 36 min    Row Name 12/15/22 0900     Education   Cardiac Education Topics [facility]   Select Core Videos     Core Videos   Educator Dietitian   Select Nutrition   Nutrition Calorie Density   Instruction Review Code 1- Verbalizes Understanding   Class Start Time 0815   Class Stop Time 0903   Class Time Calculation (min) 48 min    Row Name 12/29/22 0900     Education   Cardiac Education Topics [facility]   Glass blower/designer Nutrition   Nutrition Workshop Targeting Your Nutrition Priorities   Instruction Review Code 1- Verbalizes Understanding   Class Start Time 0815   Class Stop Time 0849   Class Time Calculation (min) 34 min    Row Name 01/08/23 0800     Education   Cardiac Education Topics [facility]   Select Core Videos     Core Videos   Educator Exercise Physiologist   Select Exercise Education   Exercise Education Biomechanial Limitations   Instruction Review Code 1- Verbalizes Understanding   Class Start Time 0815   Class Stop Time 0848   Class Time Calculation (min) 33 min    Row Name 01/10/23 1100     Education   Cardiac Education Topics Pritikin   Secondary school teacher School   Educator Nurse;Respiratory Therapist   Weekly Topic Comforting Weekend Breakfasts   Instruction Review Code 1- Verbalizes Understanding   Class Start Time 0818   Class Stop Time 0858   Class Time Calculation (min) 40 min    Row Name 01/24/23 1200     Education   Cardiac Education Topics Pritikin   Data processing manager   Weekly  Topic International Cuisine- Spotlight on the [facility] Zones   Instruction Review Code 1- Verbalizes Understanding   Class Start Time 0815   Class Stop Time 0851   Class Time Calculation (min) 36 min            Core Videos: Exercise    Move It!  Clinical staff conducted group or individual video education with verbal and written material and guidebook.  Patient learns the recommended Pritikin exercise program. Exercise with the goal of living a long, healthy life. Some of the health benefits of exercise include controlled diabetes, healthier blood pressure levels, improved cholesterol levels, improved heart and lung capacity, improved sleep, and better body composition. Everyone should speak with their doctor before starting or changing an exercise routine.  Biomechanical Limitations Clinical staff conducted group or individual video education with verbal and written material and guidebook.  Patient learns how biomechanical limitations can impact exercise and how we can mitigate and possibly overcome limitations to have an impactful and balanced exercise routine.  Body Composition Clinical staff conducted group or individual video education with verbal and written material and guidebook.  Patient learns that body composition (ratio of muscle mass to fat mass) is a key component to assessing overall fitness, rather than body weight alone. Increased fat mass, especially visceral belly fat, can put [place] at increased risk for metabolic syndrome, type 2 diabetes, heart disease, and even death. It is recommended to combine diet and exercise (cardiovascular and resistance training) to improve your body composition. Seek guidance from your physician and exercise physiologist before implementing an exercise routine.  Exercise Action Plan Clinical staff conducted group or individual video education with verbal and written material and guidebook.  Patient learns the  recommended strategies to achieve and enjoy long-term exercise adherence, including variety, self-motivation, self-efficacy, and positive decision making. Benefits of exercise include fitness, good health, weight management, more energy, better sleep, less stress, and overall well-being.  Medical   Heart Disease Risk Reduction Clinical staff conducted group or individual video education with verbal and written material and guidebook.  Patient learns our heart is our most vital organ as it circulates oxygen, nutrients, white blood cells, and hormones throughout the entire body, and carries waste away. Data supports a plant-based eating plan like the Pritikin Program for its effectiveness in slowing progression of and reversing heart disease. The video provides a number of recommendations to address heart disease.   Metabolic Syndrome and Belly Fat  Clinical staff conducted group or individual video education with verbal and written material and guidebook.  Patient learns what metabolic syndrome is, how it leads to heart disease, and how one can reverse it and keep it from coming back. You have metabolic syndrome if you have 3 of the following 5 criteria: abdominal obesity, high blood pressure, high triglycerides, low HDL cholesterol, and high blood sugar.  Hypertension and Heart Disease Clinical staff conducted group or individual video education with verbal and written material and guidebook.  Patient learns that high blood pressure, or hypertension, is very common in the [place]. Hypertension is largely due to excessive salt intake, but other important risk factors include being overweight, physical inactivity, drinking too much alcohol, smoking, and not eating enough potassium from fruits and vegetables. High blood pressure is a leading risk factor for heart attack, stroke, congestive heart failure, dementia, kidney failure, and premature death. Long-term effects of excessive salt intake include  stiffening of the arteries and thickening of heart muscle and organ damage. Recommendations include  ways to reduce hypertension and the risk of heart disease.  Diseases of Our Time - Focusing on Diabetes Clinical staff conducted group or individual video education with verbal and written material and guidebook.  Patient learns why the best way to stop diseases of our time is prevention, through food and other lifestyle changes. Medicine (such as prescription pills and surgeries) is often only a Band-Aid on the problem, not a long-term solution. Most common diseases of our time include obesity, type 2 diabetes, hypertension, heart disease, and cancer. The Pritikin Program is recommended and has been proven to help reduce, reverse, and/or prevent the damaging effects of metabolic syndrome.  Nutrition   Overview of the Pritikin Eating Plan  Clinical staff conducted group or individual video education with verbal and written material and guidebook.  Patient learns about the Pritikin Eating Plan for disease risk reduction. The Pritikin Eating Plan emphasizes a wide variety of unrefined, minimally-processed carbohydrates, like fruits, vegetables, whole grains, and legumes. Go, Caution, and Stop food choices are explained. Plant-based and lean animal proteins are emphasized. Rationale provided for low sodium intake for blood pressure control, low added sugars for blood sugar stabilization, and low added fats and oils for coronary artery disease risk reduction and weight management.  Calorie Density  Clinical staff conducted group or individual video education with verbal and written material and guidebook.  Patient learns about calorie density and how it impacts the Pritikin Eating Plan. Knowing the characteristics of the food you choose will help you decide whether those foods will lead to weight gain or weight loss, and whether you want to consume more or less of them. Weight loss is usually a side effect of  the Pritikin Eating Plan because of its focus on low calorie-dense foods.  Label Reading  Clinical staff conducted group or individual video education with verbal and written material and guidebook.  Patient learns about the Pritikin recommended label reading guidelines and corresponding recommendations regarding calorie density, added sugars, sodium content, and whole grains.  Dining Out - Part 1  Clinical staff conducted group or individual video education with verbal and written material and guidebook.  Patient learns that restaurant meals can be sabotaging because they can be so high in calories, fat, sodium, and/or sugar. Patient learns recommended strategies on how to positively address this and avoid unhealthy pitfalls.  Facts on Fats  Clinical staff conducted group or individual video education with verbal and written material and guidebook.  Patient learns that lifestyle modifications can be just as effective, if not more so, as many medications for lowering your risk of heart disease. A Pritikin lifestyle can help to reduce your risk of inflammation and atherosclerosis (cholesterol build-up, or plaque, in the artery walls). Lifestyle interventions such as dietary choices and physical activity address the cause of atherosclerosis. A review of the types of fats and their impact on blood cholesterol levels, along with dietary recommendations to reduce fat intake is also included.  Nutrition Action Plan  Clinical staff conducted group or individual video education with verbal and written material and guidebook.  Patient learns how to incorporate Pritikin recommendations into their lifestyle. Recommendations include planning and keeping personal health goals in mind as an important part of their success.  Healthy Mind-Set    Healthy Minds, Bodies, Hearts  Clinical staff conducted group or individual video education with verbal and written material and guidebook.  Patient learns how to  identify when they are stressed. Video will discuss the impact of that stress,  as well as the many benefits of stress management. Patient will also be introduced to stress management techniques. The way we think, act, and feel has an impact on our hearts.  How Our Thoughts Can Heal Our Hearts  Clinical staff conducted group or individual video education with verbal and written material and guidebook.  Patient learns that negative thoughts can cause depression and anxiety. This can result in negative lifestyle behavior and serious health problems. Cognitive behavioral therapy is an effective method to help control our thoughts in order to change and improve our emotional outlook.  Additional Videos:  Exercise    Improving Performance  Clinical staff conducted group or individual video education with verbal and written material and guidebook.  Patient learns to use a non-linear approach by alternating intensity levels and lengths of time spent exercising to help burn more calories and lose more body fat. Cardiovascular exercise helps improve heart health, metabolism, hormonal balance, blood sugar control, and recovery from fatigue. Resistance training improves strength, endurance, balance, coordination, reaction time, metabolism, and muscle mass. Flexibility exercise improves circulation, posture, and balance. Seek guidance from your physician and exercise physiologist before implementing an exercise routine and learn your capabilities and proper form for all exercise.  Introduction to Yoga  Clinical staff conducted group or individual video education with verbal and written material and guidebook.  Patient learns about yoga, a discipline of the coming together of mind, breath, and body. The benefits of yoga include improved flexibility, improved range of motion, better posture and core strength, increased lung function, weight loss, and positive self-image. Yoga's heart health benefits include lowered  blood pressure, healthier heart rate, decreased cholesterol and triglyceride levels, improved immune function, and reduced stress. Seek guidance from your physician and exercise physiologist before implementing an exercise routine and learn your capabilities and proper form for all exercise.  Medical   Aging: Enhancing Your Quality of Life  Clinical staff conducted group or individual video education with verbal and written material and guidebook.  Patient learns key strategies and recommendations to stay in good physical health and enhance quality of life, such as prevention strategies, having an advocate, securing a Health Care Proxy and Power of Attorney, and keeping a list of medications and system for tracking them. It also discusses how to avoid risk for bone loss.  Biology of Weight Control  Clinical staff conducted group or individual video education with verbal and written material and guidebook.  Patient learns that weight gain occurs because we consume more calories than we burn (eating more, moving less). Even if your body weight is normal, you may have higher ratios of fat compared to muscle mass. Too much body fat puts you at increased risk for cardiovascular disease, heart attack, stroke, type 2 diabetes, and obesity-related cancers. In addition to exercise, following the Pritikin Eating Plan can help reduce your risk.  Decoding Lab Results  Clinical staff conducted group or individual video education with verbal and written material and guidebook.  Patient learns that lab test reflects one measurement whose values change over time and are influenced by many factors, including medication, stress, sleep, exercise, food, hydration, pre-existing medical conditions, and more. It is recommended to use the knowledge from this video to become more involved with your lab results and evaluate your numbers to speak with your doctor.   Diseases of Our Time - Overview  Clinical staff conducted  group or individual video education with verbal and written material and guidebook.  Patient learns that according  to the CDC, 50% to 70% of chronic diseases (such as obesity, type 2 diabetes, elevated lipids, hypertension, and heart disease) are avoidable through lifestyle improvements including healthier food choices, listening to satiety cues, and increased physical activity.  Sleep Disorders Clinical staff conducted group or individual video education with verbal and written material and guidebook.  Patient learns how good quality and duration of sleep are important to overall health and well-being. Patient also learns about sleep disorders and how they impact health along with recommendations to address them, including discussing with a physician.  Nutrition  Dining Out - Part 2 Clinical staff conducted group or individual video education with verbal and written material and guidebook.  Patient learns how to plan ahead and communicate in order to maximize their dining experience in a healthy and nutritious manner. Included are recommended food choices based on the type of restaurant the patient is visiting.   Fueling a Banker conducted group or individual video education with verbal and written material and guidebook.  There is a strong connection between our food choices and our health. Diseases like obesity and type 2 diabetes are very prevalent and are in large-part due to lifestyle choices. The Pritikin Eating Plan provides plenty of food and hunger-curbing satisfaction. It is easy to follow, affordable, and helps reduce health risks.  Menu Workshop  Clinical staff conducted group or individual video education with verbal and written material and guidebook.  Patient learns that restaurant meals can sabotage health goals because they are often packed with calories, fat, sodium, and sugar. Recommendations include strategies to plan ahead and to communicate with the  manager, chef, or server to help order a healthier meal.  Planning Your Eating Strategy  Clinical staff conducted group or individual video education with verbal and written material and guidebook.  Patient learns about the Pritikin Eating Plan and its benefit of reducing the risk of disease. The Pritikin Eating Plan does not focus on calories. Instead, it emphasizes high-quality, nutrient-rich foods. By knowing the characteristics of the foods, we choose, we can determine their calorie density and make informed decisions.  Targeting Your Nutrition Priorities  Clinical staff conducted group or individual video education with verbal and written material and guidebook.  Patient learns that lifestyle habits have a tremendous impact on disease risk and progression. This video provides eating and physical activity recommendations based on your personal health goals, such as reducing LDL cholesterol, losing weight, preventing or controlling type 2 diabetes, and reducing high blood pressure.  Vitamins and Minerals  Clinical staff conducted group or individual video education with verbal and written material and guidebook.  Patient learns different ways to obtain key vitamins and minerals, including through a recommended healthy diet. It is important to discuss all supplements you take with your doctor.   Healthy Mind-Set    Smoking Cessation  Clinical staff conducted group or individual video education with verbal and written material and guidebook.  Patient learns that cigarette smoking and tobacco addiction pose a serious health risk which affects millions of people. Stopping smoking will significantly reduce the risk of heart disease, lung disease, and many forms of cancer. Recommended strategies for quitting are covered, including working with your doctor to develop a successful plan.  Culinary   Becoming a Set designer conducted group or individual video education with verbal and  written material and guidebook.  Patient learns that cooking at home can be healthy, cost-effective, quick, and puts them in control.  Keys to cooking healthy recipes will include looking at your recipe, assessing your equipment needs, planning ahead, making it simple, choosing cost-effective seasonal ingredients, and limiting the use of added fats, salts, and sugars.  Cooking - Breakfast and Snacks  Clinical staff conducted group or individual video education with verbal and written material and guidebook.  Patient learns how important breakfast is to satiety and nutrition through the entire day. Recommendations include key foods to eat during breakfast to help stabilize blood sugar levels and to prevent overeating at meals later in the day. Planning ahead is also a key component.  Cooking - Educational psychologist conducted group or individual video education with verbal and written material and guidebook.  Patient learns eating strategies to improve overall health, including an approach to cook more at home. Recommendations include thinking of animal protein as a side on your plate rather than center stage and focusing instead on lower calorie dense options like vegetables, fruits, whole grains, and plant-based proteins, such as beans. Making sauces in large quantities to freeze for later and leaving the skin on your vegetables are also recommended to maximize your experience.  Cooking - Healthy Salads and Dressing Clinical staff conducted group or individual video education with verbal and written material and guidebook.  Patient learns that vegetables, fruits, whole grains, and legumes are the foundations of the Pritikin Eating Plan. Recommendations include how to incorporate each of these in flavorful and healthy salads, and how to create homemade salad dressings. Proper handling of ingredients is also covered. Cooking - Soups and [employer] - Soups and Desserts Clinical staff  conducted group or individual video education with verbal and written material and guidebook.  Patient learns that Pritikin soups and desserts make for easy, nutritious, and delicious snacks and meal components that are low in sodium, fat, sugar, and calorie density, while high in vitamins, minerals, and filling fiber. Recommendations include simple and healthy ideas for soups and desserts.   Overview     The Pritikin Solution Program Overview Clinical staff conducted group or individual video education with verbal and written material and guidebook.  Patient learns that the results of the Pritikin Program have been documented in more than 100 articles published in peer-reviewed journals, and the benefits include reducing risk factors for (and, in some cases, even reversing) high cholesterol, high blood pressure, type 2 diabetes, obesity, and more! An overview of the three key pillars of the Pritikin Program will be covered: eating well, doing regular exercise, and having a healthy mind-set.  WORKSHOPS  Exercise: Exercise Basics: Building Your Action Plan Clinical staff led group instruction and group discussion with PowerPoint presentation and patient guidebook. To enhance the learning environment the use of posters, models and videos may be added. At the conclusion of this workshop, patients will comprehend the difference between physical activity and exercise, as well as the benefits of incorporating both, into their routine. Patients will understand the FITT (Frequency, Intensity, Time, and Type) principle and how to use it to build an exercise action plan. In addition, safety concerns and other considerations for exercise and cardiac rehab will be addressed by the presenter. The purpose of this lesson is to promote a comprehensive and effective weekly exercise routine in order to improve patients' overall level of fitness.   Managing Heart Disease: Your Path to a Healthier Heart Clinical  staff led group instruction and group discussion with PowerPoint presentation and patient guidebook. To enhance the learning environment the use  of posters, models and videos may be added.At the conclusion of this workshop, patients will understand the anatomy and physiology of the heart. Additionally, they will understand how Pritikin's three pillars impact the risk factors, the progression, and the management of heart disease.  The purpose of this lesson is to provide a high-level overview of the heart, heart disease, and how the Pritikin lifestyle positively impacts risk factors.  Exercise Biomechanics Clinical staff led group instruction and group discussion with PowerPoint presentation and patient guidebook. To enhance the learning environment the use of posters, models and videos may be added. Patients will learn how the structural parts of their bodies function and how these functions impact their daily activities, movement, and exercise. Patients will learn how to promote a neutral spine, learn how to manage pain, and identify ways to improve their physical movement in order to promote healthy living. The purpose of this lesson is to expose patients to common physical limitations that impact physical activity. Participants will learn practical ways to adapt and manage aches and pains, and to minimize their effect on regular exercise. Patients will learn how to maintain good posture while sitting, walking, and lifting.  Balance Training and Fall Prevention  Clinical staff led group instruction and group discussion with PowerPoint presentation and patient guidebook. To enhance the learning environment the use of posters, models and videos may be added. At the conclusion of this workshop, patients will understand the importance of their sensorimotor skills (vision, proprioception, and the vestibular system) in maintaining their ability to balance as they age. Patients will apply a variety of  balancing exercises that are appropriate for their current level of function. Patients will understand the common causes for poor balance, possible solutions to these problems, and ways to modify their physical environment in order to minimize their fall risk. The purpose of this lesson is to teach patients about the importance of maintaining balance as they age and ways to minimize their risk of falling.  WORKSHOPS   Nutrition:  Fueling a Ship broker led group instruction and group discussion with PowerPoint presentation and patient guidebook. To enhance the learning environment the use of posters, models and videos may be added. Patients will review the foundational principles of the Pritikin Eating Plan and understand what constitutes a serving size in each of the food groups. Patients will also learn Pritikin-friendly foods that are better choices when away from home and review make-ahead meal and snack options. Calorie density will be reviewed and applied to three nutrition priorities: weight maintenance, weight loss, and weight gain. The purpose of this lesson is to reinforce (in a group setting) the key concepts around what patients are recommended to eat and how to apply these guidelines when away from home by planning and selecting Pritikin-friendly options. Patients will understand how calorie density may be adjusted for different weight management goals.  Mindful Eating  Clinical staff led group instruction and group discussion with PowerPoint presentation and patient guidebook. To enhance the learning environment the use of posters, models and videos may be added. Patients will briefly review the concepts of the Pritikin Eating Plan and the importance of low-calorie dense foods. The concept of mindful eating will be introduced as well as the importance of paying attention to internal hunger signals. Triggers for non-hunger eating and techniques for dealing with triggers will be  explored. The purpose of this lesson is to provide patients with the opportunity to review the basic principles of the Pritikin Eating Plan,  discuss the value of eating mindfully and how to measure internal cues of hunger and fullness using the Hunger Scale. Patients will also discuss reasons for non-hunger eating and learn strategies to use for controlling emotional eating.  Targeting Your Nutrition Priorities Clinical staff led group instruction and group discussion with PowerPoint presentation and patient guidebook. To enhance the learning environment the use of posters, models and videos may be added. Patients will learn how to determine their genetic susceptibility to disease by reviewing their family history. Patients will gain insight into the importance of diet as part of an overall healthy lifestyle in mitigating the impact of genetics and other environmental insults. The purpose of this lesson is to provide patients with the opportunity to assess their personal nutrition priorities by looking at their family history, their own health history and current risk factors. Patients will also be able to discuss ways of prioritizing and modifying the [facility] Eating Plan for their highest risk areas  Menu  Clinical staff led group instruction and group discussion with PowerPoint presentation and patient guidebook. To enhance the learning environment the use of posters, models and videos may be added. Using menus brought in from [employer], or printed from [employer], patients will apply the [facility] dining out guidelines that were presented in the [employer] video. Patients will also be able to practice these guidelines in a variety of provided scenarios. The purpose of this lesson is to provide patients with the opportunity to practice hands-on learning of the [facility] Dining Out guidelines with actual menus and practice scenarios.  Label Reading Clinical staff led group  instruction and group discussion with PowerPoint presentation and patient guidebook. To enhance the learning environment the use of posters, models and videos may be added. Patients will review and discuss the [facility] label reading guidelines presented in [employer]'s Label Reading Educational series video. Using fool labels brought in from local grocery stores and markets, patients will apply the label reading guidelines and determine if the packaged food meet the [facility] guidelines. The purpose of this lesson is to provide patients with the opportunity to review, discuss, and practice hands-on learning of the [facility] Label Reading guidelines with actual packaged food labels. Cooking School  [facility]'s [employer] are designed to teach patients ways to prepare quick, simple, and affordable recipes at home. The importance of nutrition's role in chronic disease risk reduction is reflected in its emphasis in the overall [facility] program. By learning how to prepare essential core [facility] Eating Plan recipes, patients will increase control over what they eat; be able to customize the flavor of foods without the use of added salt, sugar, or fat; and improve the quality of the food they consume. By learning a set of core recipes which are easily assembled, quickly prepared, and affordable, patients are more likely to prepare more healthy foods at home. These workshops focus on convenient breakfasts, simple entres, side dishes, and desserts which can be prepared with minimal effort and are consistent with nutrition recommendations for cardiovascular risk reduction. Cooking [employer] are taught by a Armed forces logistics/support/administrative officer (RD) who has been trained by the [employer]. The chef or RD has a clear understanding of the importance of minimizing - if not completely eliminating - added fat, sugar, and sodium in recipes. Throughout the series of Cooking School Workshop sessions, patients  will learn about healthy ingredients and efficient methods of cooking to build confidence in their capability to prepare  Cooking School weekly topics:  Adding Flavor- Sodium-Free  Fast and Healthy Breakfasts  Powerhouse Plant-Based Proteins  Satisfying Salads and Dressings  Simple Sides and Sauces  International Cuisine-Spotlight on the [employer] Zones  Delicious Desserts  Savory Soups  Efficiency Cooking - Meals in a Snap  Tasty Appetizers and Snacks  Comforting Weekend Breakfasts  One-Pot Wonders   Fast Evening Meals  Landscape architect Your Pritikin Plate  WORKSHOPS   Healthy Mindset (Psychosocial):  Focused Goals, Sustainable Changes Clinical staff led group instruction and group discussion with PowerPoint presentation and patient guidebook. To enhance the learning environment the use of posters, models and videos may be added. Patients will be able to apply effective goal setting strategies to establish at least one personal goal, and then take consistent, meaningful action toward that goal. They will learn to identify common barriers to achieving personal goals and develop strategies to overcome them. Patients will also gain an understanding of how our mind-set can impact our ability to achieve goals and the importance of cultivating a positive and growth-oriented mind-set. The purpose of this lesson is to provide patients with a deeper understanding of how to set and achieve personal goals, as well as the tools and strategies needed to overcome common obstacles which may arise along the way.  From Head to Heart: The Power of a Healthy Outlook  Clinical staff led group instruction and group discussion with PowerPoint presentation and patient guidebook. To enhance the learning environment the use of posters, models and videos may be added. Patients will be able to recognize and describe the impact of emotions and mood on physical health. They will discover the importance  of self-care and explore self-care practices which may work for them. Patients will also learn how to utilize the 4 C's to cultivate a healthier outlook and better manage stress and challenges. The purpose of this lesson is to demonstrate to patients how a healthy outlook is an essential part of maintaining good health, especially as they continue their cardiac rehab journey.  Healthy Sleep for a Healthy Heart Clinical staff led group instruction and group discussion with PowerPoint presentation and patient guidebook. To enhance the learning environment the use of posters, models and videos may be added. At the conclusion of this workshop, patients will be able to demonstrate knowledge of the importance of sleep to overall health, well-being, and quality of life. They will understand the symptoms of, and treatments for, common sleep disorders. Patients will also be able to identify daytime and nighttime behaviors which impact sleep, and they will be able to apply these tools to help manage sleep-related challenges. The purpose of this lesson is to provide patients with a general overview of sleep and outline the importance of quality sleep. Patients will learn about a few of the most common sleep disorders. Patients will also be introduced to the concept of "sleep hygiene," and discover ways to self-manage certain sleeping problems through simple daily behavior changes. Finally, the workshop will motivate patients by clarifying the links between quality sleep and their goals of heart-healthy living.   Recognizing and Reducing Stress Clinical staff led group instruction and group discussion with PowerPoint presentation and patient guidebook. To enhance the learning environment the use of posters, models and videos may be added. At the conclusion of this workshop, patients will be able to understand the types of stress reactions, differentiate between acute and chronic stress, and recognize the impact that  chronic stress has on their health. They will  also be able to apply different coping mechanisms, such as reframing negative self-talk. Patients will have the opportunity to practice a variety of stress management techniques, such as deep abdominal breathing, progressive muscle relaxation, and/or guided imagery.  The purpose of this lesson is to educate patients on the role of stress in their lives and to provide healthy techniques for coping with it.  Learning Barriers/Preferences:  Learning Barriers/Preferences - 12/07/22 0825       Learning Barriers/Preferences   Learning Barriers Sight   wears glasses   Learning Preferences Audio;Computer/Internet;Group Instruction;Individual Instruction;Skilled Demonstration;Verbal Instruction;Video             Education Topics:  Knowledge Questionnaire Score:  Knowledge Questionnaire Score - 12/07/22 0838       Knowledge Questionnaire Score   Pre Score 21/24             Core Components/Risk Factors/Patient Goals at Admission:  Personal Goals and Risk Factors at Admission - 12/07/22 0826       Core Components/Risk Factors/Patient Goals on Admission    Weight Management Yes;Obesity;Weight Loss    Intervention Weight Management: Develop a combined nutrition and exercise program designed to reach desired caloric intake, while maintaining appropriate intake of nutrient and fiber, sodium and fats, and appropriate energy expenditure required for the weight goal.;Weight Management: Provide education and appropriate resources to help participant work on and attain dietary goals.;Weight Management/Obesity: Establish reasonable short term and long term weight goals.;Obesity: Provide education and appropriate resources to help participant work on and attain dietary goals.    Expected Outcomes Short Term: Continue to assess and modify interventions until short term weight is achieved;Long Term: Adherence to nutrition and physical activity/exercise  program aimed toward attainment of established weight goal;Weight Loss: Understanding of general recommendations for a balanced deficit meal plan, which promotes 1-2 lb weight loss per week and includes a negative energy balance of [phone] kcal/d;Understanding recommendations for meals to include 15-35% energy as protein, 25-35% energy from fat, 35-60% energy from carbohydrates, less than 200mg  of dietary cholesterol, 20-35 gm of total fiber daily;Understanding of distribution of calorie intake throughout the day with the consumption of 4-5 meals/snacks    Hypertension Yes    Intervention Provide education on lifestyle modifcations including regular physical activity/exercise, weight management, moderate sodium restriction and increased consumption of fresh fruit, vegetables, and low fat dairy, alcohol moderation, and smoking cessation.;Monitor prescription use compliance.    Expected Outcomes Short Term: Continued assessment and intervention until BP is < 140/82mm HG in hypertensive participants. < 130/9mm HG in hypertensive participants with diabetes, heart failure or chronic kidney disease.;Long Term: Maintenance of blood pressure at goal levels.    Lipids Yes    Intervention Provide education and support for participant on nutrition & aerobic/resistive exercise along with prescribed medications to achieve LDL 70mg , HDL >40mg .    Expected Outcomes Short Term: Participant states understanding of desired cholesterol values and is compliant with medications prescribed. Participant is following exercise prescription and nutrition guidelines.;Long Term: Cholesterol controlled with medications as prescribed, with individualized exercise RX and with personalized nutrition plan. Value goals: LDL < 70mg , HDL > 40 mg.    Stress Yes    Intervention Refer participants experiencing significant psychosocial distress to appropriate mental health specialists for further evaluation and treatment. When possible, include  family members and significant others in education/counseling sessions.;Offer individual and/or small group education and counseling on adjustment to heart disease, stress management and health-related lifestyle change. Teach and support self-help strategies.  Expected Outcomes Short Term: Participant demonstrates changes in health-related behavior, relaxation and other stress management skills, ability to obtain effective social support, and compliance with psychotropic medications if prescribed.;Long Term: Emotional wellbeing is indicated by absence of clinically significant psychosocial distress or social isolation.             Core Components/Risk Factors/Patient Goals Review:   Goals and Risk Factor Review     Row Name 12/11/22 1805 01/04/23 1119 02/01/23 0832         Core Components/Risk Factors/Patient Goals Review   Personal Goals Review Weight Management/Obesity;Stress;Hypertension;Lipids Weight Management/Obesity;Hypertension;Lipids;Stress Weight Management/Obesity;Hypertension;Lipids;Stress     Review Esker started cardiac rehab on 12/11/22/ Chase Walsh did well with exercise. Vital signs were stable. Chase Walsh is off to a good start to exercise at cardiac rehab. Vital signs have been stable. Chase Walsh is doing well with exercise at cardiac rehab. Vital signs have been stable.     Expected Outcomes Chase Walsh will continue to participate in cardiac rehab for exercise, nutrtion and lifestyle modifications Chase Walsh will continue to participate in cardiac rehab for exercise, nutrition and lifestyle modifications Chase Walsh will continue to participate in cardiac rehab for exercise, nutrition and lifestyle modifications              Core Components/Risk Factors/Patient Goals at Discharge (Final Review):   Goals and Risk Factor Review - 02/01/23 0832       Core Components/Risk Factors/Patient Goals Review   Personal Goals Review Weight Management/Obesity;Hypertension;Lipids;Stress    Review Chase Walsh is doing well  with exercise at cardiac rehab. Vital signs have been stable.    Expected Outcomes Chase Walsh will continue to participate in cardiac rehab for exercise, nutrition and lifestyle modifications             ITP Comments:  ITP Comments     Row Name 12/07/22 0802 12/11/22 1800 01/04/23 1112 02/01/23 0833     ITP Comments Chase Magic, MD: Medical Director.  Introduction to the [facility] Education Program/Intensive Cardiac Rehab. Initial orientation packet reviewed with the patient. 30 Day ITP Review. Kymir started cardiac rehab on 12/11/22. Lior did well with exercise. 30 Day ITP Review. Roshon has good attendance and participation in cardiac rehab. Ferry is off to a good start to exercise. 30 Day ITP Review. Cassiel has good attendance and participation in cardiac rehab.             Comments: See ITP comments.Thayer Headings RN BSN

## 2023-02-02 ENCOUNTER — Encounter (HOSPITAL_COMMUNITY)
Admission: RE | Admit: 2023-02-02 | Discharge: 2023-02-02 | Disposition: A | Discharge status: Home / Self Care | Payer: Medicaid Other | Source: Ambulatory Visit | Attending: Cardiology | Admitting: Cardiology

## 2023-02-02 DIAGNOSIS — Z5189 Encounter for other specified aftercare: Secondary | ICD-10-CM | POA: Diagnosis not present

## 2023-02-02 DIAGNOSIS — I213 ST elevation (STEMI) myocardial infarction of unspecified site: Secondary | ICD-10-CM

## 2023-02-05 ENCOUNTER — Encounter (HOSPITAL_COMMUNITY): Admission: RE | Admit: 2023-02-05 | Payer: Medicaid Other | Source: Ambulatory Visit

## 2023-02-05 ENCOUNTER — Telehealth (HOSPITAL_COMMUNITY): Payer: Self-pay | Admitting: *Deleted

## 2023-02-05 NOTE — Telephone Encounter (Signed)
Received voice mail message from Chase Walsh, will be out today due to hurting back over the weekend has been up all night.

## 2023-02-07 ENCOUNTER — Encounter (HOSPITAL_COMMUNITY)
Admission: RE | Admit: 2023-02-07 | Discharge: 2023-02-07 | Disposition: A | Discharge status: Home / Self Care | Payer: Medicaid Other | Source: Ambulatory Visit | Attending: Cardiology | Admitting: Cardiology

## 2023-02-07 DIAGNOSIS — I213 ST elevation (STEMI) myocardial infarction of unspecified site: Secondary | ICD-10-CM | POA: Diagnosis present

## 2023-02-09 ENCOUNTER — Encounter (HOSPITAL_COMMUNITY)
Admission: RE | Admit: 2023-02-09 | Discharge: 2023-02-09 | Disposition: A | Discharge status: Home / Self Care | Payer: Medicaid Other | Source: Ambulatory Visit | Attending: Cardiology | Admitting: Cardiology

## 2023-02-09 DIAGNOSIS — I213 ST elevation (STEMI) myocardial infarction of unspecified site: Secondary | ICD-10-CM | POA: Diagnosis not present

## 2023-02-12 ENCOUNTER — Encounter (HOSPITAL_COMMUNITY)
Admission: RE | Admit: 2023-02-12 | Discharge: 2023-02-12 | Disposition: A | Discharge status: Home / Self Care | Payer: Medicaid Other | Source: Ambulatory Visit | Attending: Cardiology | Admitting: Cardiology

## 2023-02-12 DIAGNOSIS — I213 ST elevation (STEMI) myocardial infarction of unspecified site: Secondary | ICD-10-CM

## 2023-02-14 ENCOUNTER — Encounter (HOSPITAL_COMMUNITY)
Admission: RE | Admit: 2023-02-14 | Discharge: 2023-02-14 | Disposition: A | Discharge status: Home / Self Care | Payer: Medicaid Other | Source: Ambulatory Visit | Attending: Cardiology | Admitting: Cardiology

## 2023-02-14 DIAGNOSIS — I213 ST elevation (STEMI) myocardial infarction of unspecified site: Secondary | ICD-10-CM

## 2023-02-16 ENCOUNTER — Encounter (HOSPITAL_COMMUNITY)
Admission: RE | Admit: 2023-02-16 | Discharge: 2023-02-16 | Disposition: A | Discharge status: Home / Self Care | Payer: Medicaid Other | Source: Ambulatory Visit | Attending: Cardiology | Admitting: Cardiology

## 2023-02-16 DIAGNOSIS — I213 ST elevation (STEMI) myocardial infarction of unspecified site: Secondary | ICD-10-CM

## 2023-02-19 ENCOUNTER — Encounter (HOSPITAL_COMMUNITY): Payer: Medicaid Other

## 2023-02-21 ENCOUNTER — Encounter (HOSPITAL_COMMUNITY)
Admission: RE | Admit: 2023-02-21 | Discharge: 2023-02-21 | Disposition: A | Discharge status: Home / Self Care | Payer: Medicaid Other | Source: Ambulatory Visit | Attending: Cardiology | Admitting: Cardiology

## 2023-02-21 VITALS — Ht 69.0 in | Wt 221.3 lb

## 2023-02-21 DIAGNOSIS — I213 ST elevation (STEMI) myocardial infarction of unspecified site: Secondary | ICD-10-CM | POA: Diagnosis not present

## 2023-02-23 ENCOUNTER — Encounter (HOSPITAL_COMMUNITY)
Admission: RE | Admit: 2023-02-23 | Discharge: 2023-02-23 | Disposition: A | Discharge status: Home / Self Care | Payer: Medicaid Other | Source: Ambulatory Visit | Attending: Cardiology | Admitting: Cardiology

## 2023-02-23 DIAGNOSIS — I213 ST elevation (STEMI) myocardial infarction of unspecified site: Secondary | ICD-10-CM

## 2023-02-23 NOTE — Progress Notes (Signed)
 Discharge Progress Report  Patient Details  Name: Chase Walsh MRN: [id number] Date of Birth: Sep 27, 1970 Referring Provider:   Flowsheet Row CARDIAC REHAB PHASE II ORIENTATION from 12/07/2022 in [facility]  Referring Provider Percell Belt, MD        Number of Visits: Exercise =24 sessions  Education=10 sessions  Reason for Discharge:  Patient reached a stable level of exercise. Patient independent in their exercise. Patient has met program and personal goals.  Smoking History:  Social History   Tobacco Use  Smoking Status Never  Smokeless Tobacco Never    Diagnosis:  10/01/22 ST elevation myocardial infarction (STEMI)  ADL UCSD:   Initial Exercise Prescription:  Initial Exercise Prescription - 12/07/22 0900       Date of Initial Exercise RX and Referring Provider   Date 12/07/22    Referring Provider Percell Belt, MD    Expected Discharge Date 02/28/23      Recumbant Bike   Level 1    RPM 60    Watts 30    Minutes 15    METs 3      Recumbant Elliptical   Level 1    RPM 55    Watts 70    Minutes 15    METs 3      Prescription Details   Frequency (times per week) 3    Duration Progress to 30 minutes of continuous aerobic without signs/symptoms of physical distress      Intensity   THRR 40-80% of Max Heartrate 67-134    Ratings of Perceived Exertion 11-13    Perceived Dyspnea 0-4      Progression   Progression Continue progressive overload as per policy without signs/symptoms or physical distress.      Resistance Training   Training Prescription Yes    Weight 4    Reps 10-15             Discharge Exercise Prescription (Final Exercise Prescription Changes):  Exercise Prescription Changes - 02/23/23 0800       Response to Exercise   Blood Pressure (Admit) 130/80    Blood Pressure (Exit) 120/80    Heart Rate (Admit) 61 bpm    Heart Rate (Exercise) 125 bpm    Heart  Rate (Exit) 69 bpm    Rating of Perceived Exertion (Exercise) 11    Perceived Dyspnea (Exercise) 0    Symptoms none    Comments Patient graduated the [employer] program    Duration Progress to 30 minutes of  aerobic without signs/symptoms of physical distress    Intensity THRR unchanged      Progression   Progression Continue to progress workloads to maintain intensity without signs/symptoms of physical distress.    Average METs 4.55      Resistance Training   Training Prescription Yes    Weight 6 lbs wts    Reps 10-15    Time 10 Minutes      Recumbant Bike   Level 4    RPM 66    Watts 72    Minutes 15    METs 4.4      Recumbant Elliptical   Level 4    RPM 78    Watts 125    Minutes 15    METs 4.7      Home Exercise Plan   Plans to continue exercise at Home (comment)    Frequency Add 2 additional days to program exercise sessions.  Initial Home Exercises Provided 12/29/22             Functional Capacity:  6 Minute Walk     Row Name 12/07/22 0957 02/21/23 0817       6 Minute Walk   Phase Initial Initial    Distance 1440 feet 1615 feet    Distance % Change -- 12.15 %    Distance Feet Change -- 175 ft    Walk Time 6 minutes 6 minutes    # of Rest Breaks 0 0    MPH 2.73 3.06    METS 3.74 4    RPE 9 11    Perceived Dyspnea  0 0    VO2 Peak 13.07 14.01    Symptoms No No    Resting HR 70 bpm 54 bpm    Resting BP 126/88 130/80    Resting Oxygen Saturation  98 % --    Exercise Oxygen Saturation  during 6 min walk 98 % --    Max Ex. HR 89 bpm 89 bpm    Max Ex. BP 132/82 142/86    2 Minute Post BP 122/84 140/82             Psychological, QOL, Others - Outcomes: PHQ 2/9:    02/21/2023    8:01 AM 12/07/2022    8:37 AM 11/09/2022    8:13 AM 05/01/2022   11:37 AM 09/15/2021    9:19 AM  Depression screen PHQ 2/9  Decreased Interest 0 1 3 0 0  Down, Depressed, Hopeless 0 1 1 0 0  PHQ - 2 Score 0 2 4 0 0  Altered sleeping 1 2 2  0   Tired,  decreased energy 1 2 3  0   Change in appetite 0 0 0 0   Feeling bad or failure about yourself  0 0 1 0   Trouble concentrating 0 0 2 0   Moving slowly or fidgety/restless 0 0 2 0   Suicidal thoughts 0 0 0 0   PHQ-9 Score 2 6 14  0   Difficult doing work/chores Not difficult at all Somewhat difficult Extremely dIfficult Not difficult at all     Quality of Life:  Quality of Life - 02/23/23 0829       Quality of Life   Select Quality of Life      Quality of Life Scores   Health/Function Post 23.63 %    Socioeconomic Post 19.94 %    Psych/Spiritual Post 25.07 %    Family Post 23.7 %    GLOBAL Post 23.09 %             Personal Goals: Goals established at orientation with interventions provided to work toward goal.  Personal Goals and Risk Factors at Admission - 12/07/22 0826       Core Components/Risk Factors/Patient Goals on Admission    Weight Management Yes;Obesity;Weight Loss    Intervention Weight Management: Develop a combined nutrition and exercise program designed to reach desired caloric intake, while maintaining appropriate intake of nutrient and fiber, sodium and fats, and appropriate energy expenditure required for the weight goal.;Weight Management: Provide education and appropriate resources to help participant work on and attain dietary goals.;Weight Management/Obesity: Establish reasonable short term and long term weight goals.;Obesity: Provide education and appropriate resources to help participant work on and attain dietary goals.    Expected Outcomes Short Term: Continue to assess and modify interventions until short term weight is achieved;Long Term: Adherence to nutrition and physical  activity/exercise program aimed toward attainment of established weight goal;Weight Loss: Understanding of general recommendations for a balanced deficit meal plan, which promotes 1-2 lb weight loss per week and includes a negative energy balance of [phone] kcal/d;Understanding  recommendations for meals to include 15-35% energy as protein, 25-35% energy from fat, 35-60% energy from carbohydrates, less than 200mg  of dietary cholesterol, 20-35 gm of total fiber daily;Understanding of distribution of calorie intake throughout the day with the consumption of 4-5 meals/snacks    Hypertension Yes    Intervention Provide education on lifestyle modifcations including regular physical activity/exercise, weight management, moderate sodium restriction and increased consumption of fresh fruit, vegetables, and low fat dairy, alcohol moderation, and smoking cessation.;Monitor prescription use compliance.    Expected Outcomes Short Term: Continued assessment and intervention until BP is < 140/12mm HG in hypertensive participants. < 130/50mm HG in hypertensive participants with diabetes, heart failure or chronic kidney disease.;Long Term: Maintenance of blood pressure at goal levels.    Lipids Yes    Intervention Provide education and support for participant on nutrition & aerobic/resistive exercise along with prescribed medications to achieve LDL 70mg , HDL >40mg .    Expected Outcomes Short Term: Participant states understanding of desired cholesterol values and is compliant with medications prescribed. Participant is following exercise prescription and nutrition guidelines.;Long Term: Cholesterol controlled with medications as prescribed, with individualized exercise RX and with personalized nutrition plan. Value goals: LDL < 70mg , HDL > 40 mg.    Stress Yes    Intervention Refer participants experiencing significant psychosocial distress to appropriate mental health specialists for further evaluation and treatment. When possible, include family members and significant others in education/counseling sessions.;Offer individual and/or small group education and counseling on adjustment to heart disease, stress management and health-related lifestyle change. Teach and support self-help strategies.     Expected Outcomes Short Term: Participant demonstrates changes in health-related behavior, relaxation and other stress management skills, ability to obtain effective social support, and compliance with psychotropic medications if prescribed.;Long Term: Emotional wellbeing is indicated by absence of clinically significant psychosocial distress or social isolation.              Personal Goals Discharge:  Goals and Risk Factor Review     Row Name 12/11/22 1805 01/04/23 1119 02/01/23 0832         Core Components/Risk Factors/Patient Goals Review   Personal Goals Review Weight Management/Obesity;Stress;Hypertension;Lipids Weight Management/Obesity;Hypertension;Lipids;Stress Weight Management/Obesity;Hypertension;Lipids;Stress     Review Sricharan started cardiac rehab on 12/11/22/ Shadee did well with exercise. Vital signs were stable. Cordarrius is off to a good start to exercise at cardiac rehab. Vital signs have been stable. Hunter is doing well with exercise at cardiac rehab. Vital signs have been stable.     Expected Outcomes Tayquan will continue to participate in cardiac rehab for exercise, nutrtion and lifestyle modifications Anubis will continue to participate in cardiac rehab for exercise, nutrition and lifestyle modifications Kimmy will continue to participate in cardiac rehab for exercise, nutrition and lifestyle modifications              Exercise Goals and Review:  Exercise Goals     Row Name 12/07/22 0804             Exercise Goals   Increase Physical Activity Yes       Intervention Provide advice, education, support and counseling about physical activity/exercise needs.;Develop an individualized exercise prescription for aerobic and resistive training based on initial evaluation findings, risk stratification, comorbidities and participant's personal goals.  Expected Outcomes Short Term: Attend rehab on a regular basis to increase amount of physical activity.;Long Term: Add in home  exercise to make exercise part of routine and to increase amount of physical activity.;Long Term: Exercising regularly at least 3-5 days a week.       Increase Strength and Stamina Yes       Intervention Provide advice, education, support and counseling about physical activity/exercise needs.;Develop an individualized exercise prescription for aerobic and resistive training based on initial evaluation findings, risk stratification, comorbidities and participant's personal goals.       Expected Outcomes Short Term: Increase workloads from initial exercise prescription for resistance, speed, and METs.;Short Term: Perform resistance training exercises routinely during rehab and add in resistance training at home;Long Term: Improve cardiorespiratory fitness, muscular endurance and strength as measured by increased METs and functional capacity ( )       Able to understand and use rate of perceived exertion (RPE) scale Yes       Intervention Provide education and explanation on how to use RPE scale       Expected Outcomes Long Term:  Able to use RPE to guide intensity level when exercising independently;Short Term: Able to use RPE daily in rehab to express subjective intensity level       Knowledge and understanding of Target Heart Rate Range (THRR) Yes       Intervention Provide education and explanation of THRR including how the numbers were predicted and where they are located for reference       Expected Outcomes Short Term: Able to state/look up THRR;Short Term: Able to use daily as guideline for intensity in rehab;Long Term: Able to use THRR to govern intensity when exercising independently       Understanding of Exercise Prescription Yes       Intervention Provide education, explanation, and written materials on patient's individual exercise prescription       Expected Outcomes Short Term: Able to explain program exercise prescription;Long Term: Able to explain home exercise prescription to exercise  independently                Exercise Goals Re-Evaluation:  Exercise Goals Re-Evaluation     Row Name 12/11/22 0826 12/29/22 0837 02/02/23 1057 02/23/23 0819       Exercise Goal Re-Evaluation   Exercise Goals Review Increase Physical Activity;Understanding of Exercise Prescription;Increase Strength and Stamina;Knowledge and understanding of Target Heart Rate Range (THRR);Able to understand and use rate of perceived exertion (RPE) scale Increase Physical Activity;Understanding of Exercise Prescription;Increase Strength and Stamina;Knowledge and understanding of Target Heart Rate Range (THRR);Able to understand and use rate of perceived exertion (RPE) scale Increase Physical Activity;Understanding of Exercise Prescription;Increase Strength and Stamina;Knowledge and understanding of Target Heart Rate Range (THRR);Able to understand and use rate of perceived exertion (RPE) scale Increase Physical Activity;Understanding of Exercise Prescription;Increase Strength and Stamina;Knowledge and understanding of Target Heart Rate Range (THRR);Able to understand and use rate of perceived exertion (RPE) scale    Comments Pt first day in the [facility] program. Pt tolerated exercise well with an average MET level of 2.5. Pt is learning his THRR, RPE and ExRx. Off to a great start Reviewed MET's, goals and home ExRx. Pt tolerated exercise well with an average MET level of 3.4. Ptis doing very well and progressing MET's. Pt feels good about his goals and has made new diet changes and is increasing strength, he does feel his stamina is limitied by medication side effects, but overall feels good  with his progress. He is going to start exercising with his wife by doing a video home workout 2 days for 30-45 mins per session. Reviewed MET's and goals. Pt tolerated exercise well with an average MET level of 3.4. He is feeling good with his progress and is gaining strength and stamina. He has made a lot of dietary changes  on his own and states he's having success. He states he has no additional needs at this time. Excited for travel over the weekend to celebrate his birthday Patient graduated the [facility] program. Pt tolerated exercise well with an average MET level of 4.55. He is about to move next weekend, sohe will work on getting into a gym once settled, until then some walking and stretching. he plans to exercise 2-5 days for 30-60 mins per session. He did good on his post walk test and increased by 175 ft fot a total of 1632ft. He was also able to decrease his %BF    Expected Outcomes Will continue to monitor pt and progress workloads as tolerated without sign or symptom Will continue to monitor pt and progress workloads as tolerated without sign or symptom Will continue to monitor pt and progress workloads as tolerated without sign or symptom Will continue to monitor pt and progress workloads as tolerated without sign or symptom             Nutrition & Weight - Outcomes:  Pre Biometrics - 12/07/22 0801       Pre Biometrics   Waist Circumference 48 inches    Hip Circumference 45.5 inches    Waist to Hip Ratio 1.05 %    Triceps Skinfold 36 mm    % Body Fat 36 %    Grip Strength 38 kg    Flexibility 13.5 in    Single Leg Stand 12.2 seconds             Post Biometrics - 02/21/23 0818        Post  Biometrics   Height 5\' 9"  (1.753 m)    Weight 100.4 kg    Waist Circumference 42 inches    Hip Circumference 43.5 inches    Waist to Hip Ratio 0.97 %    BMI (Calculated) 32.67    Triceps Skinfold 21 mm    % Body Fat 31.1 %    Grip Strength 53 kg    Flexibility 12.25 in    Single Leg Stand 12.25 seconds             Nutrition:   Nutrition Discharge:  Nutrition Assessments - 02/23/23 0830       Rate Your Plate Scores   Post Score 75             Education Questionnaire Score:  Knowledge Questionnaire Score - 02/23/23 0834       Knowledge Questionnaire Score   Post  Score 23/24            Pt graduated today from cardiac rehab program with completion of  24 exercise and 10 education sessions. Pt maintained good attendance and progressed nicely during his participation in rehab as evidenced by increased MET level.   Medication list reconciled. Repeat  PHQ9 score= 2 .  Pt has made significant lifestyle changes and should be commended for the changes made and improvements.  Daman achieved his goals during cardiac rehab.  Abu will be moving next week and once settled in he plans to look into gym options.  Dr. Gloris Manchester  [facility] Medical Director Cardiac Rehabilitation

## 2023-02-26 ENCOUNTER — Encounter (HOSPITAL_COMMUNITY): Payer: Medicaid Other

## 2023-02-28 ENCOUNTER — Encounter (HOSPITAL_COMMUNITY): Payer: Medicaid Other

## 2023-02-28 ENCOUNTER — Other Ambulatory Visit: Payer: Self-pay | Admitting: Nurse Practitioner

## 2023-02-28 DIAGNOSIS — I2510 Atherosclerotic heart disease of native coronary artery without angina pectoris: Secondary | ICD-10-CM | POA: Diagnosis not present

## 2023-02-28 DIAGNOSIS — N1832 Chronic kidney disease, stage 3b: Secondary | ICD-10-CM | POA: Diagnosis not present

## 2023-02-28 DIAGNOSIS — D631 Anemia in chronic kidney disease: Secondary | ICD-10-CM | POA: Diagnosis not present

## 2023-02-28 DIAGNOSIS — Q272 Other congenital malformations of renal artery: Secondary | ICD-10-CM | POA: Diagnosis not present

## 2023-02-28 DIAGNOSIS — N189 Chronic kidney disease, unspecified: Secondary | ICD-10-CM | POA: Diagnosis not present

## 2023-02-28 DIAGNOSIS — N2581 Secondary hyperparathyroidism of renal origin: Secondary | ICD-10-CM | POA: Diagnosis not present

## 2023-02-28 DIAGNOSIS — I129 Hypertensive chronic kidney disease with stage 1 through stage 4 chronic kidney disease, or unspecified chronic kidney disease: Secondary | ICD-10-CM | POA: Diagnosis not present

## 2023-02-28 LAB — LAB REPORT - SCANNED
Albumin, Urine POC: 207.1
Creatinine, POC: 51.9 mg/dL
EGFR: 31
Microalb Creat Ratio: 399

## 2023-03-01 ENCOUNTER — Ambulatory Visit: Payer: Medicaid Other | Admitting: Nurse Practitioner

## 2023-03-01 ENCOUNTER — Encounter: Payer: Self-pay | Admitting: Nurse Practitioner

## 2023-03-01 VITALS — BP 132/86 | HR 66 | Temp 98.4°F | Ht 69.0 in | Wt 221.2 lb

## 2023-03-01 DIAGNOSIS — I129 Hypertensive chronic kidney disease with stage 1 through stage 4 chronic kidney disease, or unspecified chronic kidney disease: Secondary | ICD-10-CM | POA: Diagnosis not present

## 2023-03-01 DIAGNOSIS — N183 Chronic kidney disease, stage 3 unspecified: Secondary | ICD-10-CM

## 2023-03-01 DIAGNOSIS — E782 Mixed hyperlipidemia: Secondary | ICD-10-CM

## 2023-03-01 DIAGNOSIS — Z2821 Immunization not carried out because of patient refusal: Secondary | ICD-10-CM

## 2023-03-01 DIAGNOSIS — Z6832 Body mass index (BMI) 32.0-32.9, adult: Secondary | ICD-10-CM

## 2023-03-01 DIAGNOSIS — E66811 Obesity, class 1: Secondary | ICD-10-CM | POA: Diagnosis not present

## 2023-03-01 DIAGNOSIS — E6609 Other obesity due to excess calories: Secondary | ICD-10-CM

## 2023-03-01 LAB — LIPID PANEL
Chol/HDL Ratio: 4 ratio (ref 0.0–5.0)
Cholesterol, Total: 132 mg/dL (ref 100–199)
HDL: 33 mg/dL — ABNORMAL LOW (ref >39)
LDL Chol Calc (NIH): 72 mg/dL (ref 0–99)
Triglycerides: 153 mg/dL — ABNORMAL HIGH (ref 0–149)
VLDL Cholesterol Cal: 27 mg/dL (ref 5–40)

## 2023-03-01 MED ORDER — TICAGRELOR 90 MG PO TABS: 90.0000 mg | ORAL_TABLET | Freq: Two times a day (BID) | ORAL | 1 refills | Status: AC

## 2023-03-01 NOTE — Progress Notes (Signed)
 Madelaine Bhat, CMA,acting as a Neurosurgeon for [name], FNP.,have documented all relevant documentation on the behalf of [name], FNP,as directed by  [name], FNP while in the presence of [name], FNP.  Subjective:  Patient ID: Chase Walsh , male    DOB: [birth year] , 53 y.o.   MRN: [id number]  Chief Complaint  Patient presents with   Hypertension    HPI  Patient presents today for a bp and chol follow up, Patient reports compliance with medication. Patient denies any chest pain, SOB, or headaches. Patient has no concerns today. 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 He has not established with a physician.     Past Medical History:  Diagnosis Date   Allergy    Class 1 obesity due to excess calories with serious comorbidity and body mass index (BMI) of 33.0 to 33.9 in adult 07/08/2018   Diverticulosis    Fall 11/21/2018   Gout    Hematochezia 04/21/2016   History of COVID-19 02/05/2019   Hypertension    Lower GI bleed    Myocardial infarction [facility]) 10/01/22   Vitiligo      Family History  Problem Relation Age of Onset   Breast cancer Mother    Arthritis Mother    Cancer Mother    Obesity Mother    Cancer Paternal Uncle        maybe colon   Stroke Father    Diabetes Sister    Obesity Sister    Stroke Sister    Diabetes Brother    Kidney disease Brother    Obesity Brother    Kidney disease Brother    Inflammatory bowel disease Neg Hx      Current Outpatient Medications:    Ascorbic Acid (VITAMIN C) 1000 MG tablet, Take 1,000 mg by mouth every other day., Disp: , Rfl:    aspirin 81 MG chewable tablet, Chew 1 tablet (81 mg total) by mouth daily., Disp: 90 tablet, Rfl: 2   atorvastatin (LIPITOR) 80 MG tablet, Take 1 tablet (80 mg total) by mouth daily., Disp: 90 tablet, Rfl: 2   dapagliflozin propanediol (FARXIGA) 10 MG TABS tablet, Take by mouth daily., Disp: , Rfl:    fluocinonide (LIDEX) 0.05 % external solution, Apply 1  application  topically as needed (breakout)., Disp: , Rfl:    magnesium oxide (MAG-OX) 400 (240 Mg) MG tablet, Take 400 mg by mouth daily., Disp: , Rfl:    metoprolol succinate (TOPROL-XL) 25 MG 24 hr tablet, Take 1 tablet (25 mg total) by mouth daily., Disp: 90 tablet, Rfl: 2   nitroGLYCERIN (NITROSTAT) 0.4 MG SL tablet, Place 0.4 mg under the tongue every 5 (five) minutes as needed for chest pain. (Patient not taking: Reported on 11/17/2022), Disp: , Rfl:    ticagrelor (BRILINTA) 90 MG TABS tablet, Take 1 tablet (90 mg total) by mouth 2 (two) times daily., Disp: 180 tablet, Rfl: 1   Allergies  Allergen Reactions   Allopurinol Rash     Review of Systems  Constitutional: Negative.   HENT: Negative.    Eyes: Negative.   Respiratory: Negative.    Cardiovascular:  Negative for chest pain.  Gastrointestinal: Negative.   Neurological: Negative.   Psychiatric/Behavioral: Negative.       Today's Vitals   03/01/23 0832 03/01/23 0911  BP: (!) 130/90 132/86  Pulse: 66   Temp: 98.4 F (36.9 C)   TempSrc: Oral   Weight: 221 lb 3.2 oz (  100.3 kg)   Height: 5\' 9"  (1.753 m)   PainSc: 0-No pain    Body mass index is 32.67 kg/m.  Wt Readings from Last 3 Encounters:  03/01/23 221 lb 3.2 oz (100.3 kg)  02/21/23 221 lb 5.5 oz (100.4 kg)  01/31/23 216 lb (98 kg)    Objective:  Physical Exam Vitals reviewed.  Constitutional:      General: He is not in acute distress.    Appearance: Normal appearance. He is obese.  Cardiovascular:     Rate and Rhythm: Normal rate and regular rhythm.     Pulses: Normal pulses.     Heart sounds: Normal heart sounds. No murmur heard. Pulmonary:     Effort: Pulmonary effort is normal. No respiratory distress.     Breath sounds: Normal breath sounds. No wheezing.  Skin:    General: Skin is warm and dry.  Neurological:     General: No focal deficit present.     Mental Status: He is alert and oriented to person, place, and time.     Cranial Nerves: No  cranial nerve deficit.     Motor: No weakness.  Psychiatric:        Mood and Affect: Mood normal.        Behavior: Behavior normal.        Thought Content: Thought content normal.        Judgment: Judgment normal.      Assessment And Plan:  Benign hypertension with CKD (chronic kidney disease) stage III (HCC) Assessment & Plan: Continue current medications blood pressure has improved slightly, had to repeat and is better. Continue f/u with Cardiology who he seen recently since he is relocating to [place], [place].    Mixed hyperlipidemia Assessment & Plan: Cholesterol levels are improved, will recheck lipid panel today. Continue statin.  Orders: -     Lipid panel  Herpes zoster vaccination declined Assessment & Plan: Declines shingrix, educated on disease process    COVID-19 vaccination declined Assessment & Plan: Declines covid 19 vaccine. Discussed risk of covid 90 and if he changes her mind about the vaccine to call the office. Education has been provided regarding the importance of this vaccine but patient still declined. Advised may receive this vaccine at local pharmacy or Health Dept.or vaccine clinic. Aware to provide a copy of the vaccination record if obtained from local pharmacy or Health Dept.  Encouraged to take multivitamin, vitamin d, vitamin c and zinc to increase immune system. Aware can call office if would like to have vaccine here at office. Verbalized acceptance and understanding.    Class 1 obesity due to excess calories with serious comorbidity and body mass index (BMI) of 32.0 to 32.9 in adult Assessment & Plan: he is encouraged to strive for BMI less than 30 to decrease cardiac risk. Advised to aim for at least 150 minutes of exercise per week.    Other orders -     Ticagrelor; Take 1 tablet (90 mg total) by mouth 2 (two) times daily.  Dispense: 180 tablet; Refill: 1   Return for 6 month bp check.   Patient was given opportunity to ask questions.  Patient verbalized understanding of the plan and was able to repeat key elements of the plan. All questions were answered to their satisfaction.   Jeanell Sparrow, FNP, have reviewed all documentation for this visit. The documentation on 0/25 for the exam, diagnosis, procedures, and orders are all accurate and co/mplete.   IF YOU HAVE  BEEN REFERRED TO A SPECIALIST, IT MAY TAKE 1-2 WEEKS TO SCHEDULE/PROCESS THE REFERRAL. IF YOU HAVE NOT HEARD FROM US/SPECIALIST IN TWO WEEKS, PLEASE GIVE [place] A CALL AT [phone].

## 2023-03-14 ENCOUNTER — Encounter: Payer: Self-pay | Admitting: Nurse Practitioner

## 2023-03-15 DIAGNOSIS — Z2821 Immunization not carried out because of patient refusal: Secondary | ICD-10-CM | POA: Insufficient documentation

## 2023-03-15 NOTE — Assessment & Plan Note (Addendum)
 Continue current medications blood pressure has improved slightly, had to repeat and is better. Continue f/u with Cardiology who he seen recently since he is relocating to [place], [place].

## 2023-03-15 NOTE — Assessment & Plan Note (Signed)
 he is encouraged to strive for BMI less than 30 to decrease cardiac risk. Advised to aim for at least 150 minutes of exercise per week.

## 2023-03-15 NOTE — Assessment & Plan Note (Signed)
 Declines shingrix, educated on disease process

## 2023-03-15 NOTE — Assessment & Plan Note (Signed)

## 2023-03-15 NOTE — Assessment & Plan Note (Signed)
 Cholesterol levels are improved, will recheck lipid panel today. Continue statin.
